# Patient Record
Sex: Female | Born: 1980 | Race: Black or African American | Hispanic: No | Marital: Single | Smoking: Never smoker
Health system: Southern US, Community
[De-identification: ages and names within clinical notes are randomized; demographics above are authoritative.]

## PROBLEM LIST (undated history)

## (undated) DIAGNOSIS — K589 Irritable bowel syndrome without diarrhea: Secondary | ICD-10-CM

## (undated) DIAGNOSIS — G4733 Obstructive sleep apnea (adult) (pediatric): Secondary | ICD-10-CM

## (undated) DIAGNOSIS — D649 Anemia, unspecified: Secondary | ICD-10-CM

## (undated) DIAGNOSIS — I10 Essential (primary) hypertension: Secondary | ICD-10-CM

## (undated) DIAGNOSIS — E119 Type 2 diabetes mellitus without complications: Secondary | ICD-10-CM

## (undated) DIAGNOSIS — G4730 Sleep apnea, unspecified: Secondary | ICD-10-CM

## (undated) DIAGNOSIS — G5603 Carpal tunnel syndrome, bilateral upper limbs: Secondary | ICD-10-CM

## (undated) DIAGNOSIS — A6000 Herpesviral infection of urogenital system, unspecified: Secondary | ICD-10-CM

## (undated) DIAGNOSIS — D219 Benign neoplasm of connective and other soft tissue, unspecified: Secondary | ICD-10-CM

## (undated) DIAGNOSIS — G43909 Migraine, unspecified, not intractable, without status migrainosus: Secondary | ICD-10-CM

## (undated) DIAGNOSIS — R011 Cardiac murmur, unspecified: Secondary | ICD-10-CM

## (undated) DIAGNOSIS — Z8669 Personal history of other diseases of the nervous system and sense organs: Secondary | ICD-10-CM

## (undated) HISTORY — DX: Sleep apnea, unspecified: G47.30

## (undated) HISTORY — DX: Benign neoplasm of connective and other soft tissue, unspecified: D21.9

## (undated) HISTORY — DX: Herpesviral infection of urogenital system, unspecified: A60.00

## (undated) HISTORY — PX: WISDOM TOOTH EXTRACTION: SHX21

## (undated) HISTORY — DX: Essential (primary) hypertension: I10

## (undated) HISTORY — DX: Personal history of other diseases of the nervous system and sense organs: Z86.69

## (undated) HISTORY — DX: Cardiac murmur, unspecified: R01.1

## (undated) HISTORY — DX: Migraine, unspecified, not intractable, without status migrainosus: G43.909

## (undated) HISTORY — DX: Type 2 diabetes mellitus without complications: E11.9

## (undated) HISTORY — DX: Morbid (severe) obesity due to excess calories: E66.01

## (undated) HISTORY — DX: Obstructive sleep apnea (adult) (pediatric): G47.33

## (undated) HISTORY — PX: CARPAL TUNNEL RELEASE: SHX101

## (undated) HISTORY — DX: Carpal tunnel syndrome, bilateral upper limbs: G56.03

---

## 2007-07-22 ENCOUNTER — Ambulatory Visit (HOSPITAL_COMMUNITY): Admission: RE | Admit: 2007-07-22 | Discharge: 2007-07-22 | Payer: Self-pay | Admitting: Obstetrics & Gynecology

## 2007-07-22 IMAGING — US US OB COMP LESS 14 WK
1 series · 14 of 28 positions shown · non-contrast
Comparison: none

OBSTETRICAL ULTRASOUND:
 This ultrasound was performed in The [HOSPITAL], and the [facility] OB/GYN report will be stored to [REDACTED] PACS.

[Series 1: us ob comp less 14 wk · 14 of 42 slices shown]
[im 2/42]
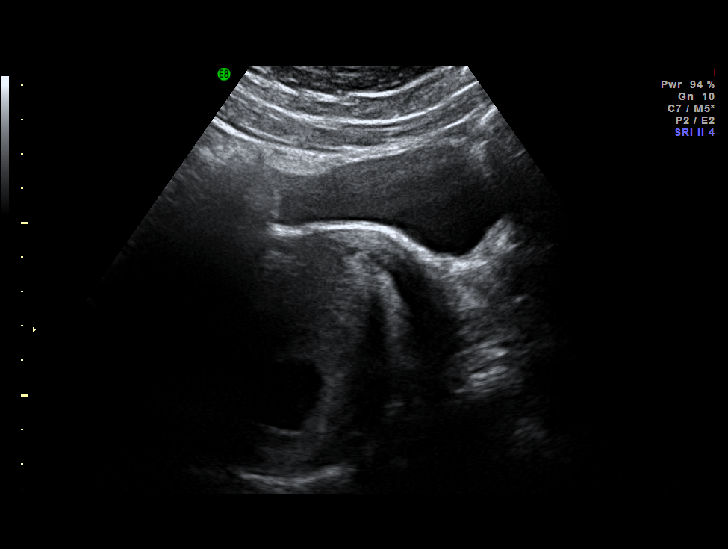
[im 5/42]
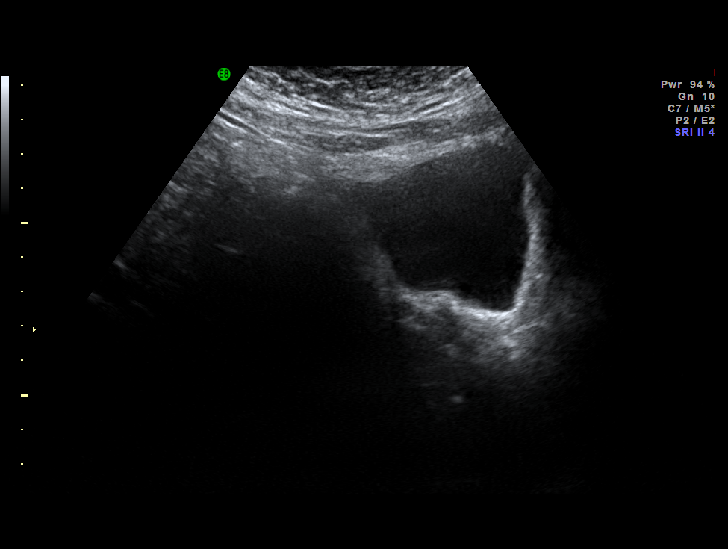
[im 8/42]
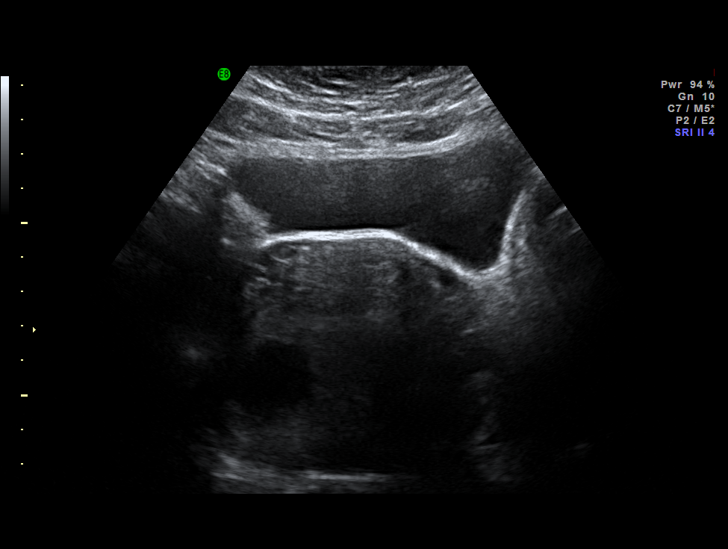
[im 11/42]
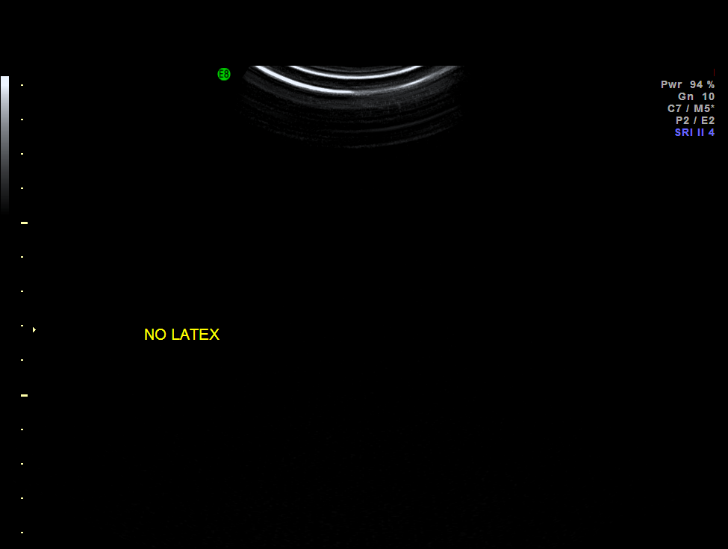
[im 14/42]
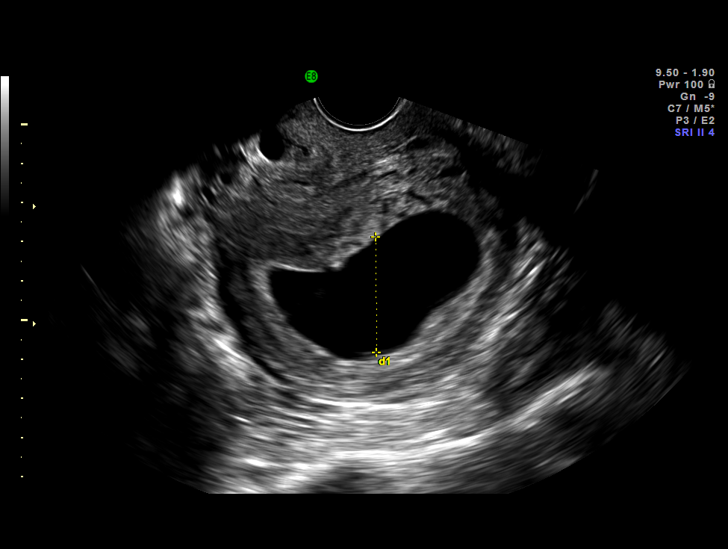
[im 17/42]
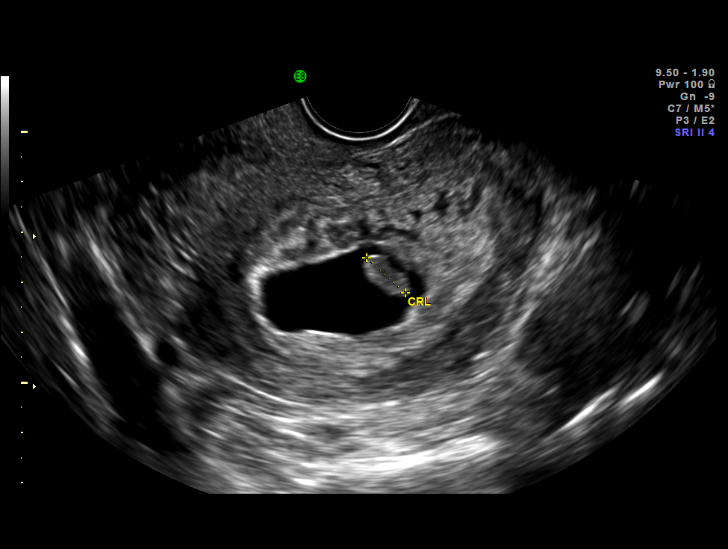
[im 20/42]
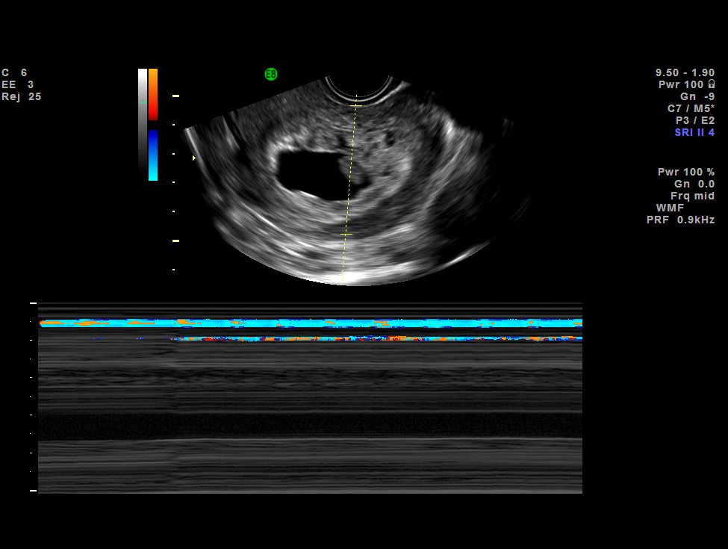
[im 23/42]
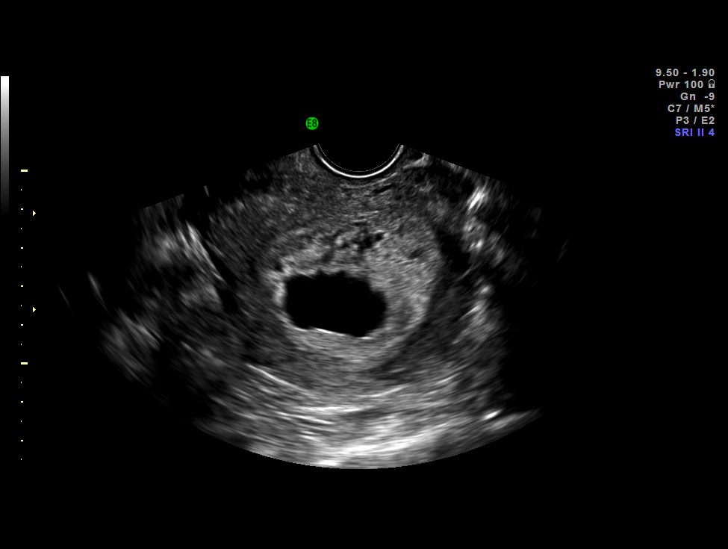
[im 26/42]
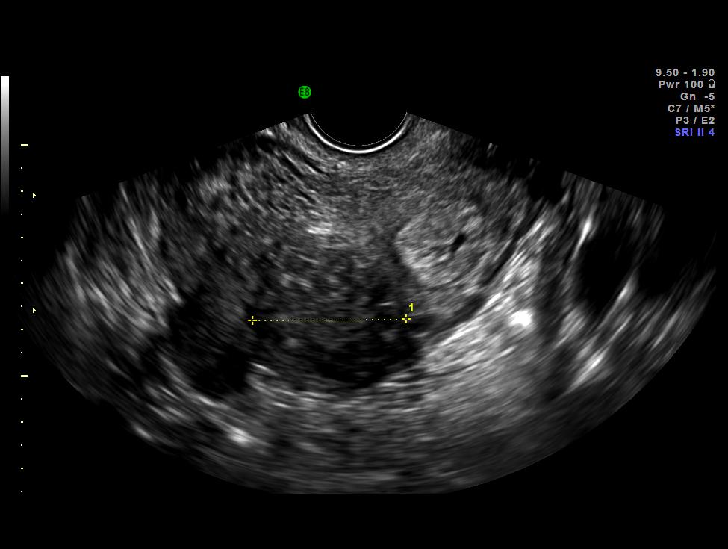
[im 29/42]
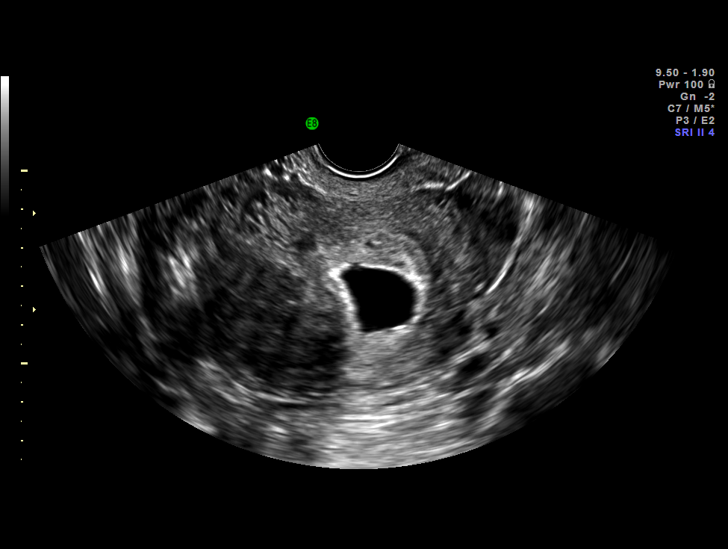
[im 32/42]
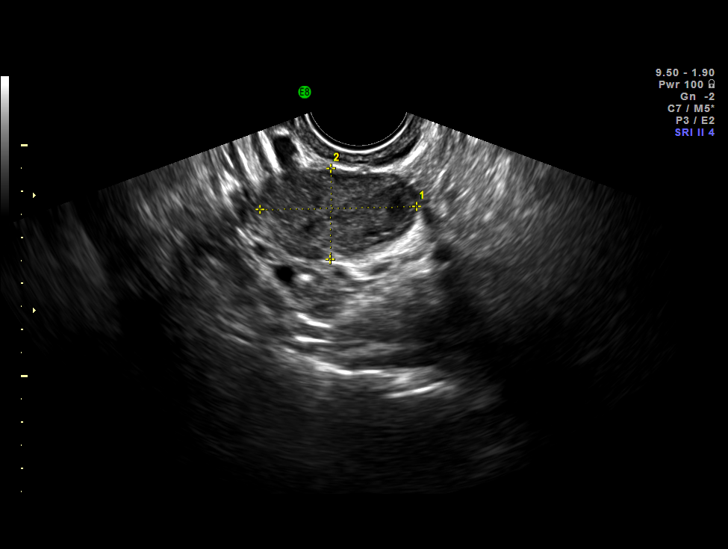
[im 35/42]
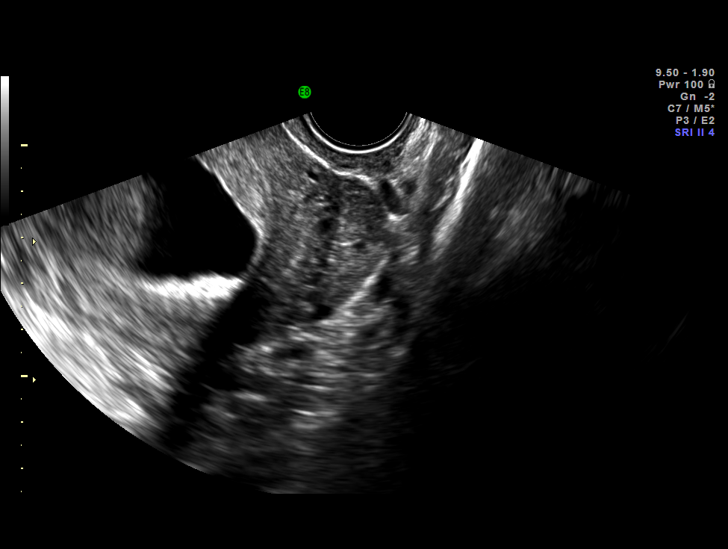
[im 38/42]
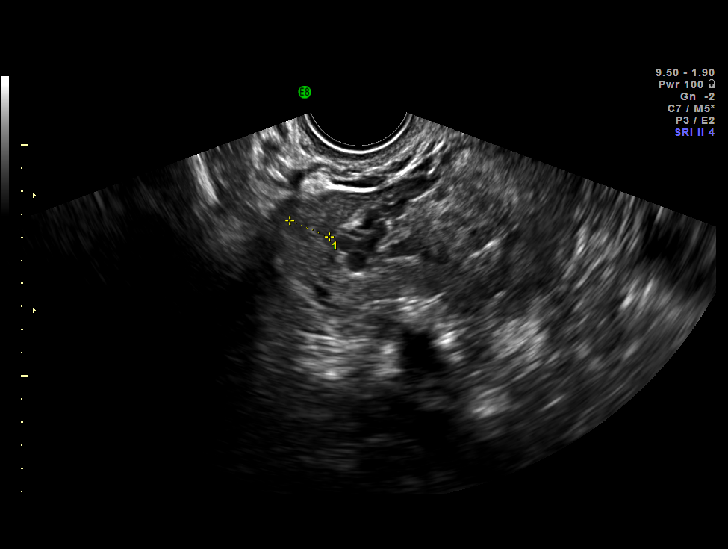
[im 42/42]
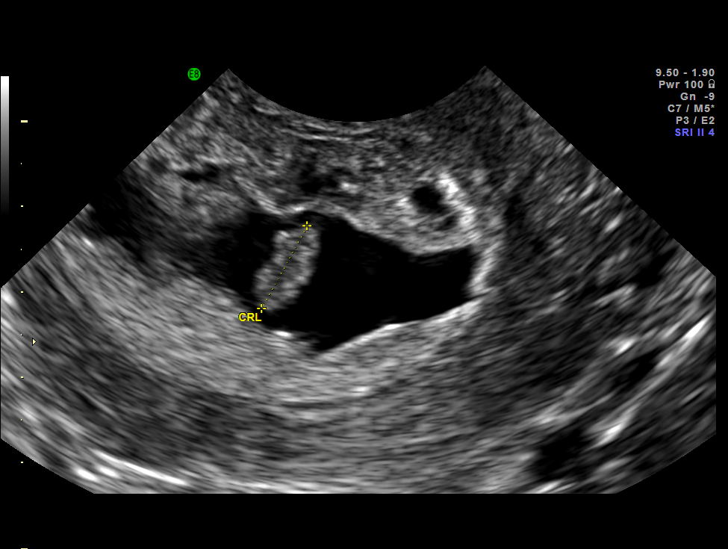

[14 of 28 positions shown; findings below may reference images not displayed]

IMPRESSION: The [facility] OB/GYN report has also been faxed to the ordering physician.

## 2007-07-25 ENCOUNTER — Ambulatory Visit (HOSPITAL_COMMUNITY): Admission: RE | Admit: 2007-07-25 | Discharge: 2007-07-25 | Payer: Self-pay | Admitting: Obstetrics & Gynecology

## 2007-07-25 IMAGING — US US OB TRANSVAGINAL
1 series · 14 of 20 positions shown · non-contrast
Comparison: none

OBSTETRICAL ULTRASOUND:

 This ultrasound exam was performed in the [HOSPITAL] Ultrasound Department.  The OB US report was generated in the AS system, and faxed to the ordering physician.  This report is also available in [REDACTED] PACS.

[Series 1: us ob transvaginal · 0.13mm/px · 14 of 20 slices shown]
[im 1/20]
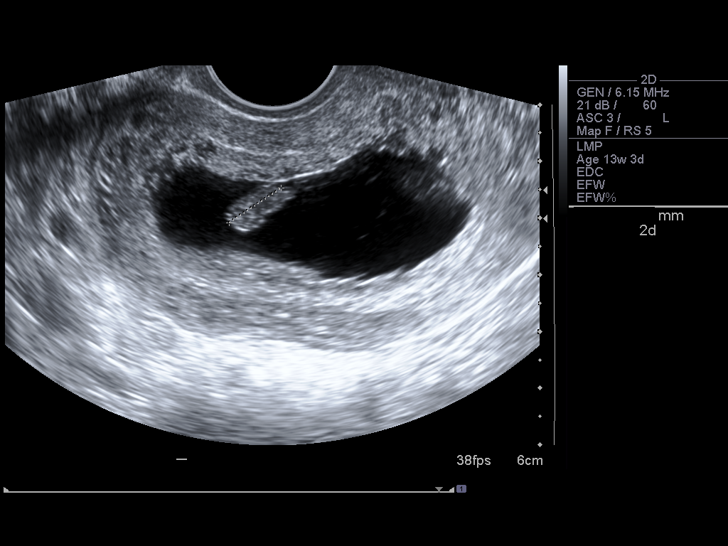
[im 3/20]
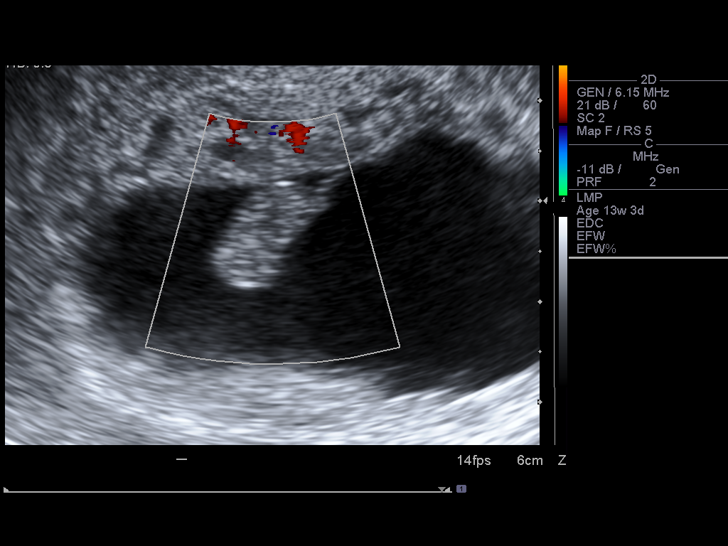
[im 4/20]
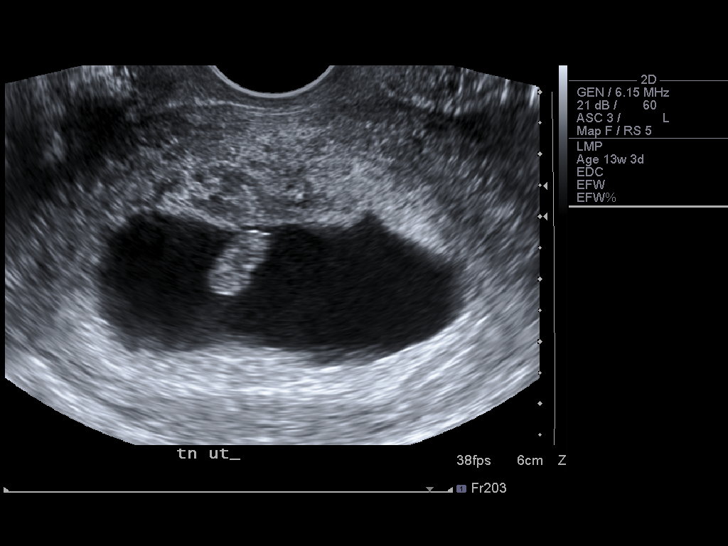
[im 6/20]
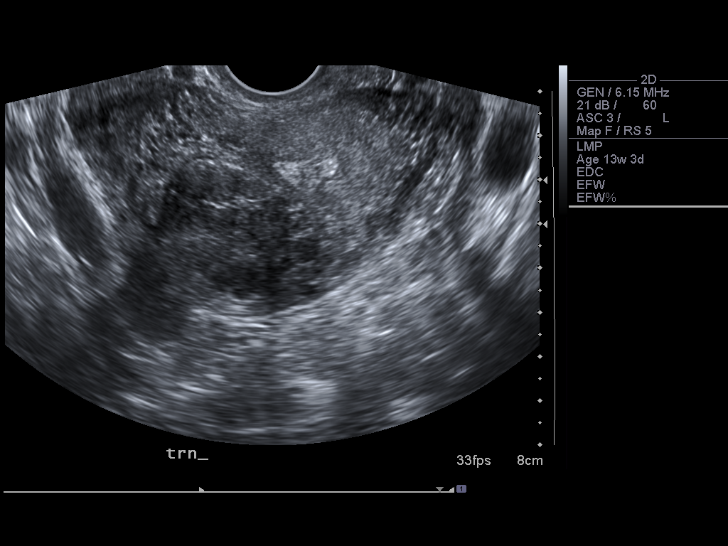
[im 7/20]
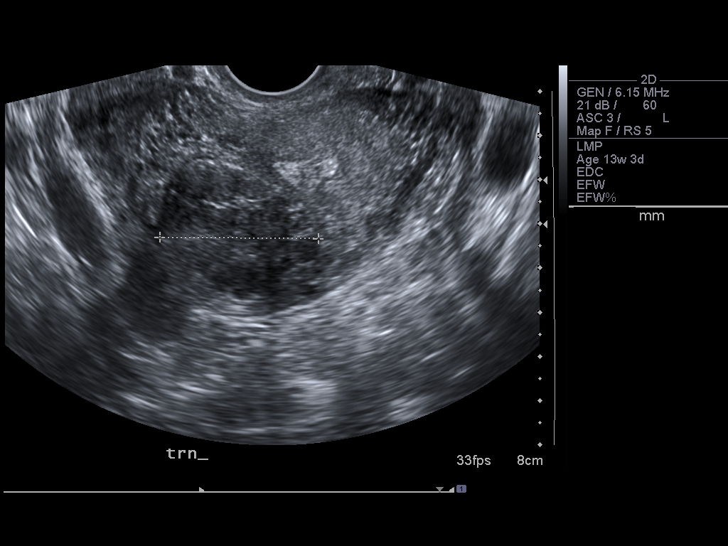
[im 8/20]
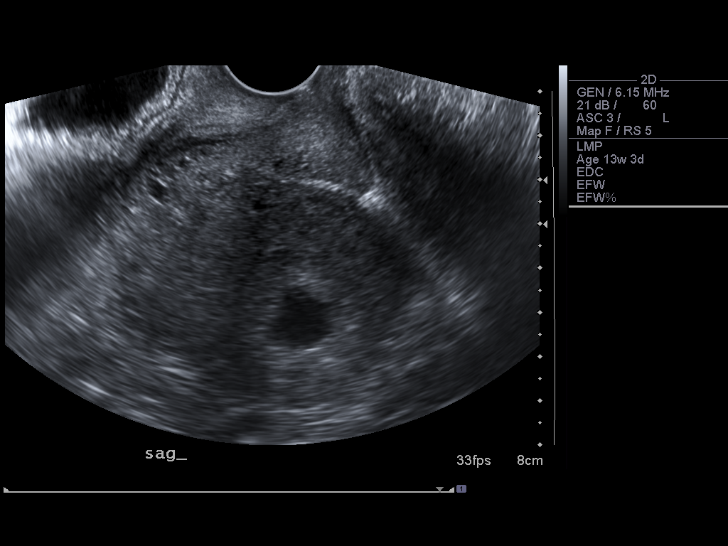
[im 10/20]
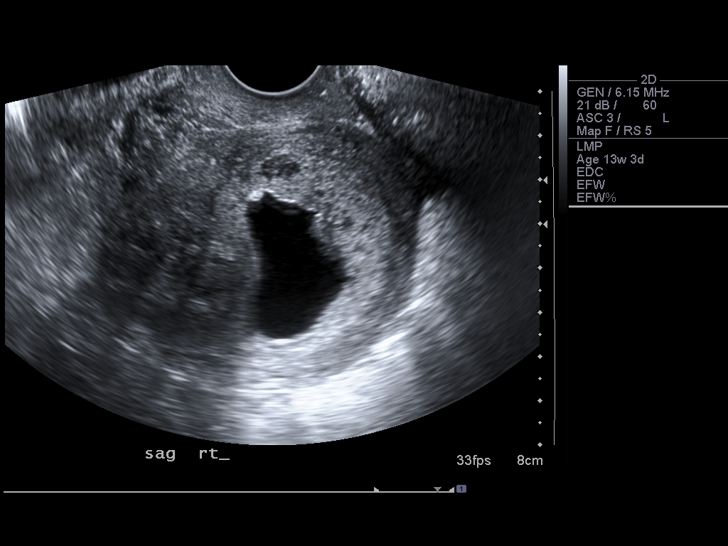
[im 11/20]
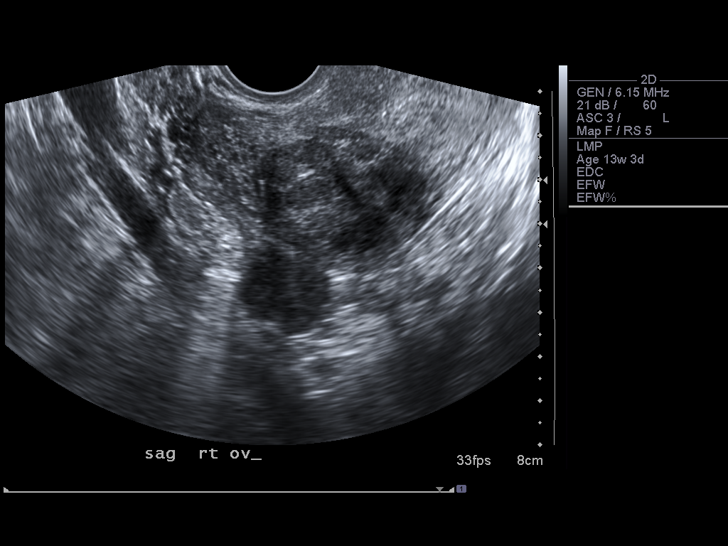
[im 13/20]
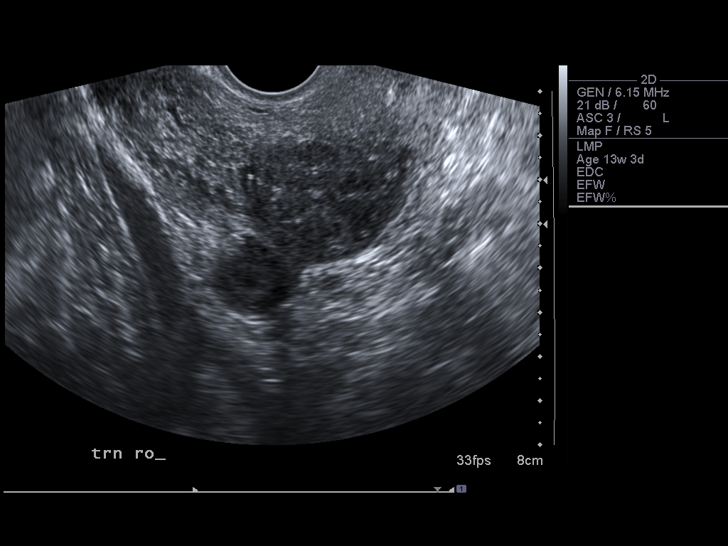
[im 14/20]
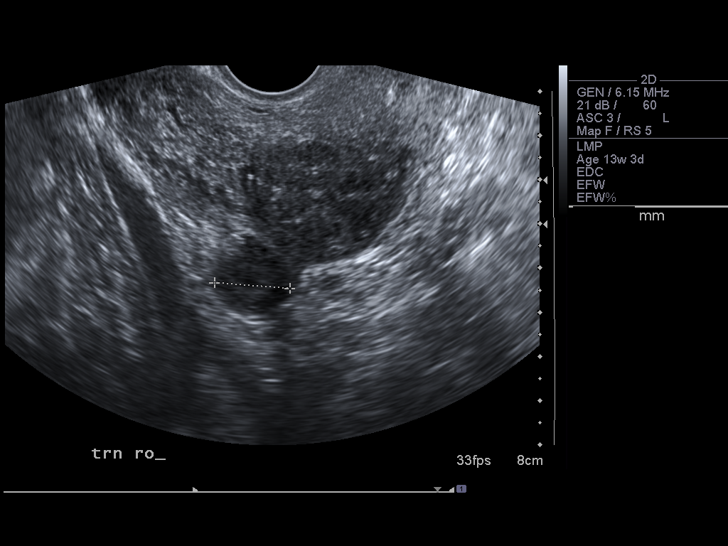
[im 16/20]
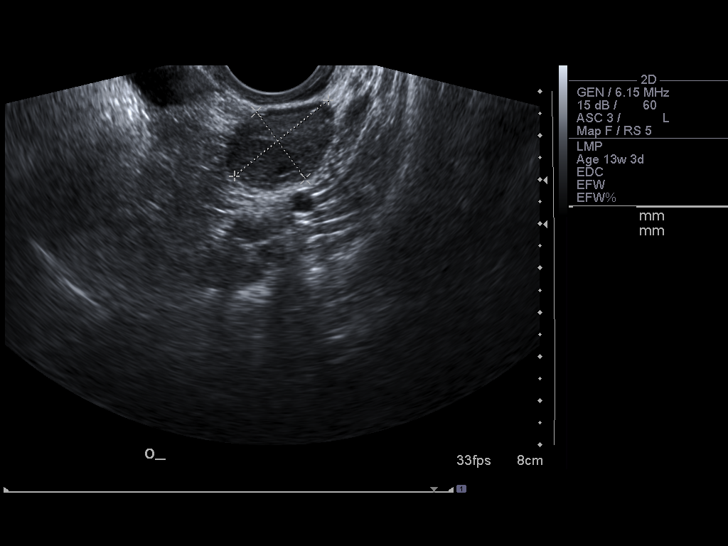
[im 17/20]
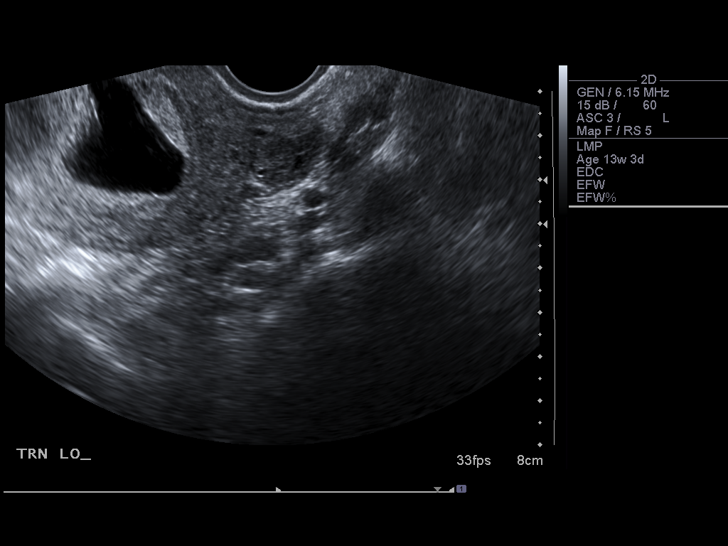
[im 18/20]
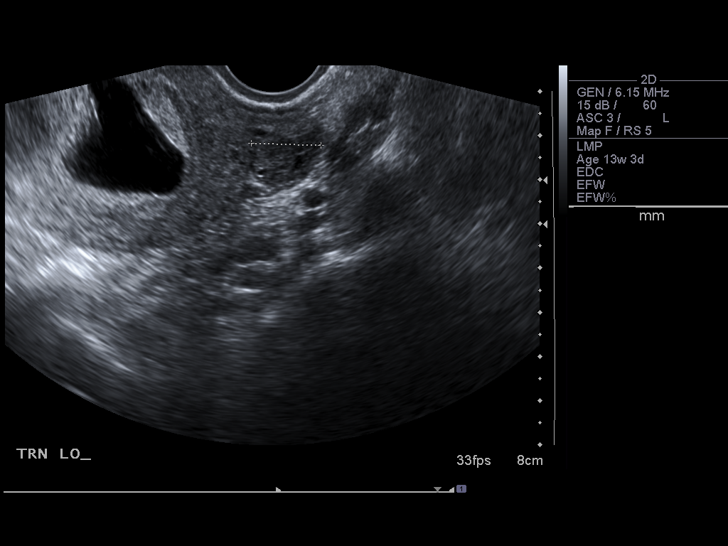
[im 20/20]
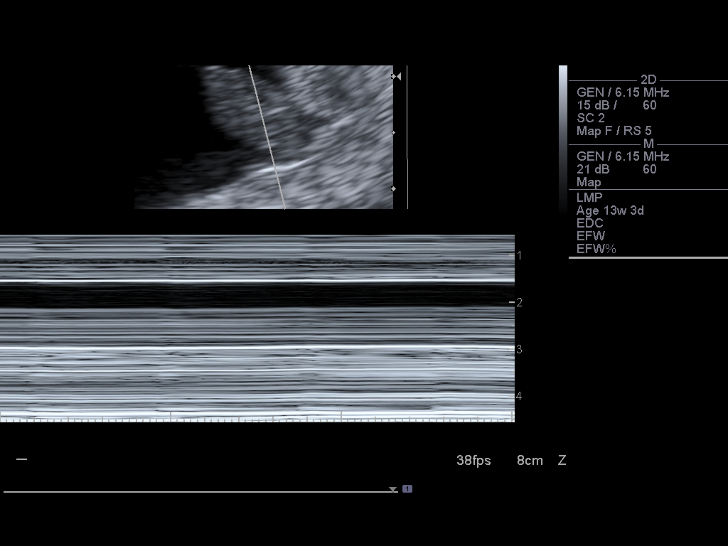

[14 of 20 positions shown; findings below may reference images not displayed]

IMPRESSION: See AS Obstetric US report.

## 2007-07-28 ENCOUNTER — Encounter: Payer: Self-pay | Admitting: Obstetrics & Gynecology

## 2007-07-28 ENCOUNTER — Observation Stay (HOSPITAL_COMMUNITY): Admission: AD | Admit: 2007-07-28 | Discharge: 2007-07-29 | Payer: Self-pay | Admitting: Obstetrics & Gynecology

## 2008-03-26 ENCOUNTER — Ambulatory Visit (HOSPITAL_COMMUNITY): Admission: RE | Admit: 2008-03-26 | Discharge: 2008-03-26 | Payer: Self-pay | Admitting: Obstetrics & Gynecology

## 2008-03-26 IMAGING — US US OB TRANSVAGINAL MODIFY
1 series · 14 of 28 positions shown · non-contrast
Comparison: none

OBSTETRICAL ULTRASOUND:
 This ultrasound exam was performed in the [HOSPITAL] Ultrasound Department.  The OB US report was generated in the AS system, and faxed to the ordering physician.  This report is also available in [REDACTED] PACS.

[Series 1: us ob transvaginal modify · 0.26mm/px · 14 of 36 slices shown]
[im 2/36]
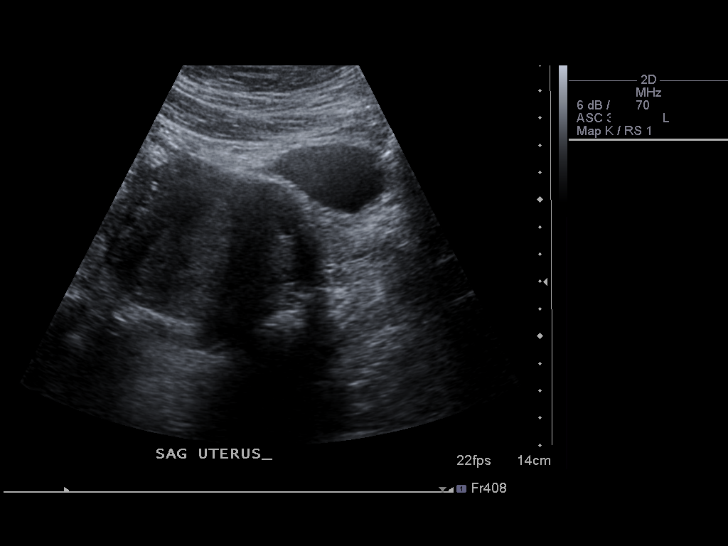
[im 4/36]
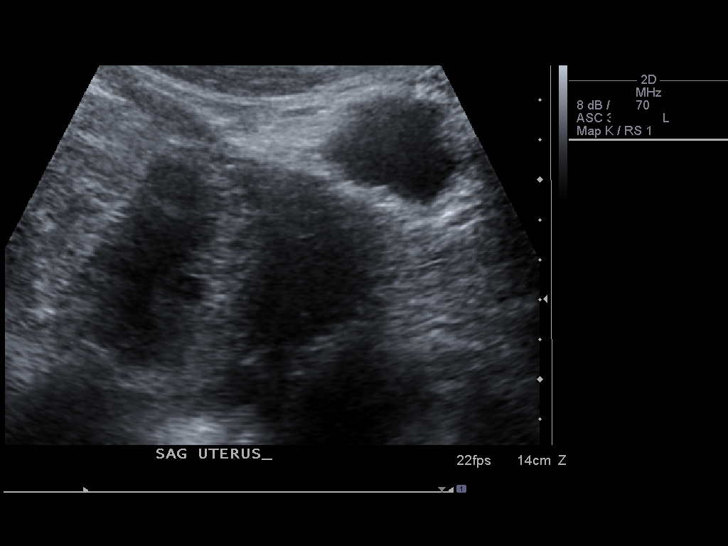
[im 7/36]
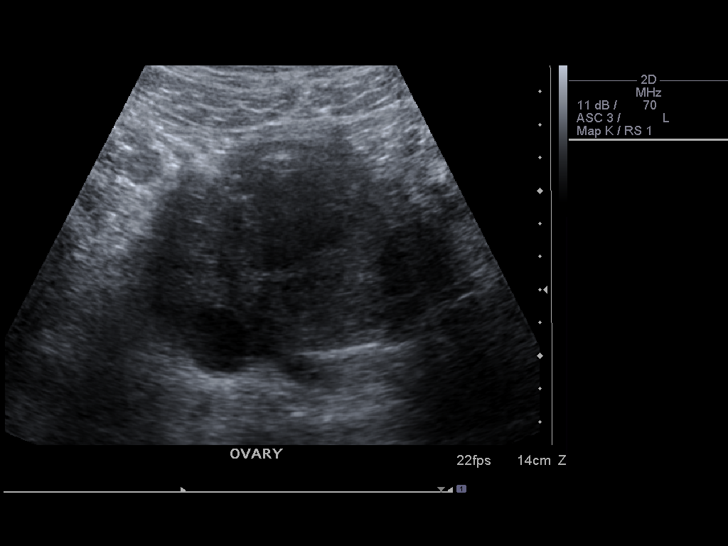
[im 10/36]
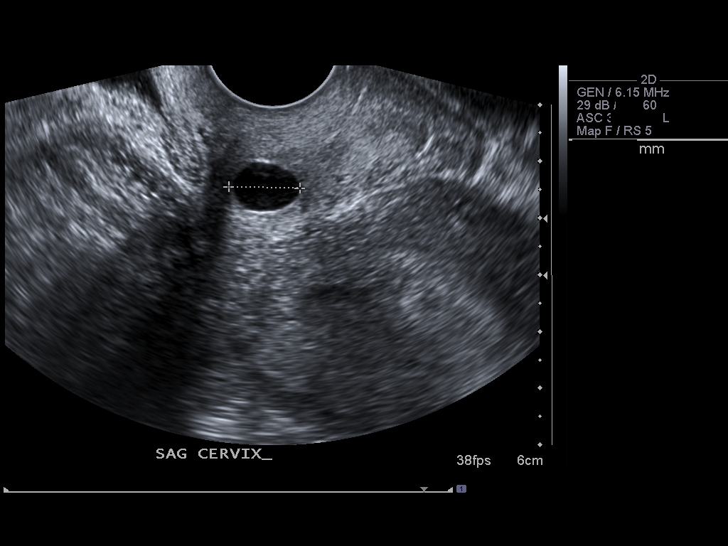
[im 12/36]
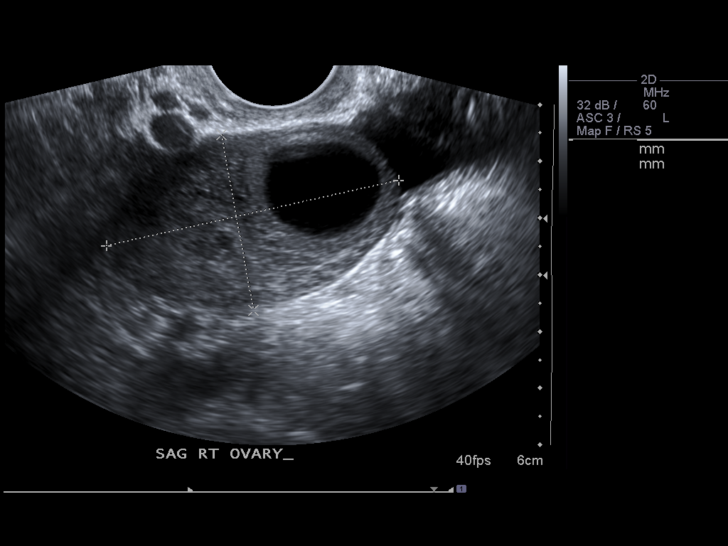
[im 15/36]
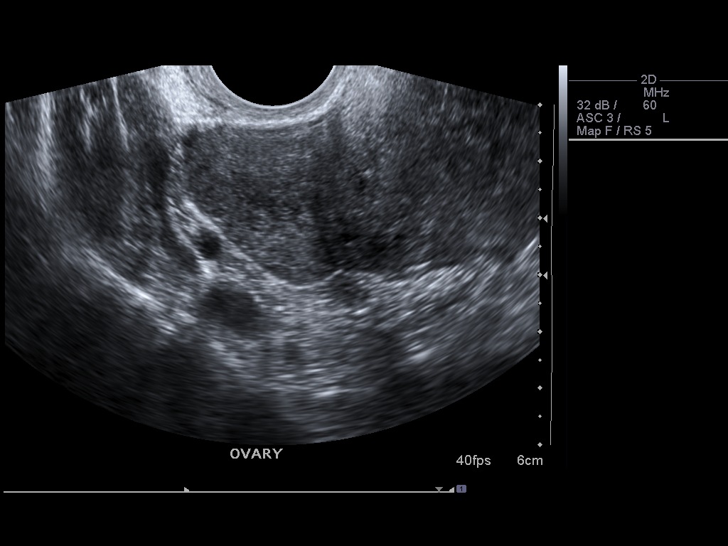
[im 17/36]
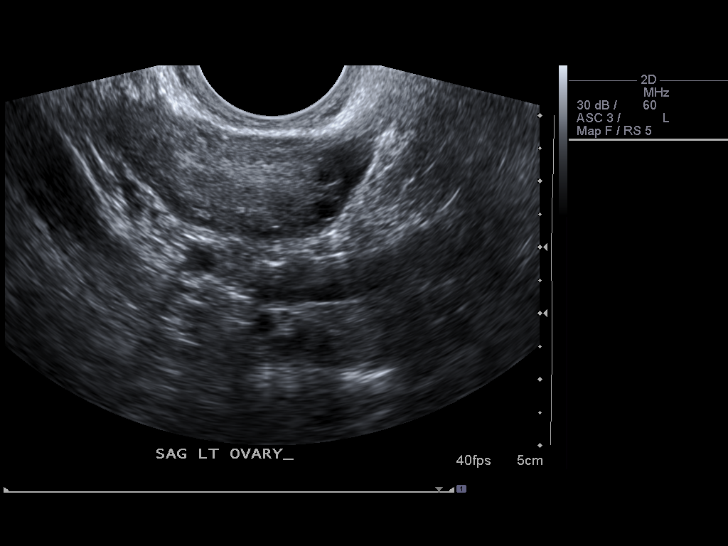
[im 20/36]
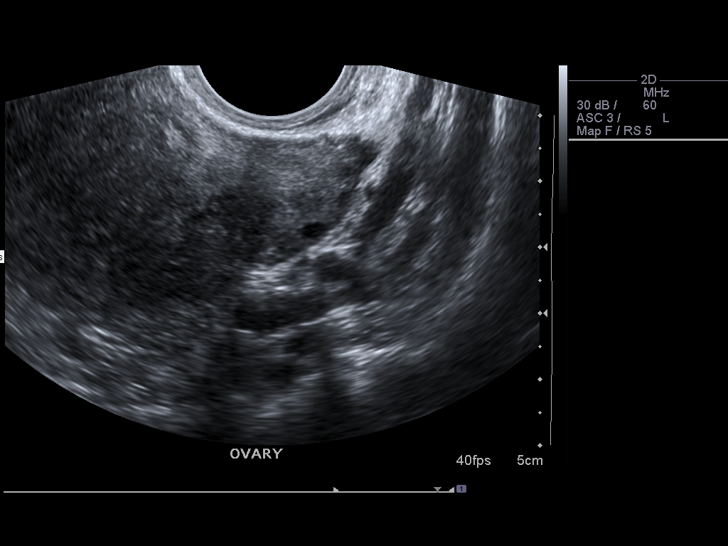
[im 23/36]
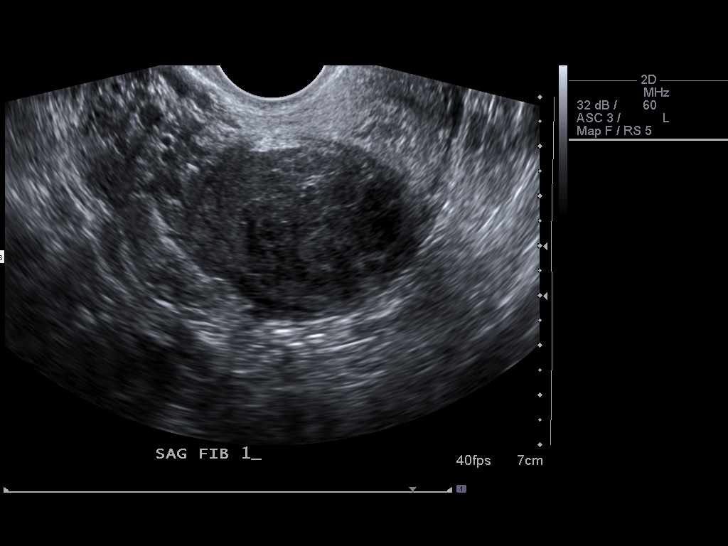
[im 25/36]
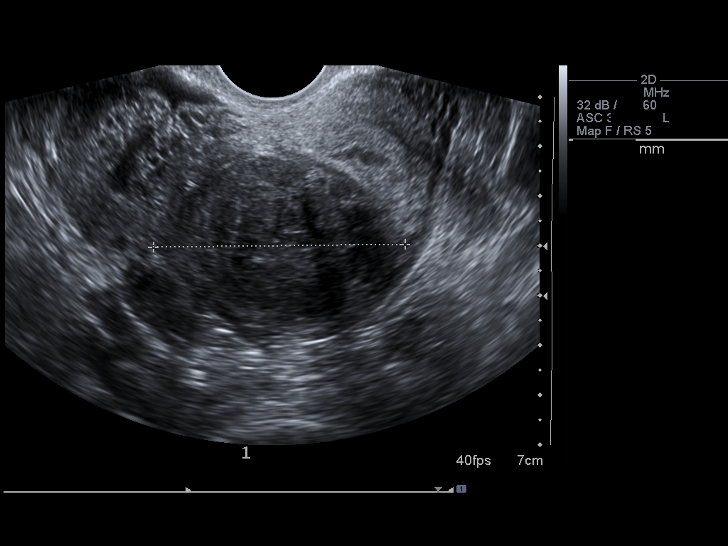
[im 28/36]
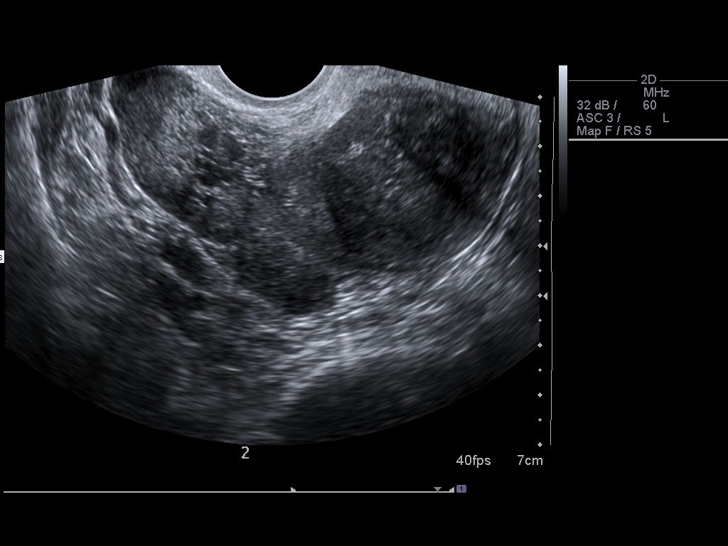
[im 30/36]
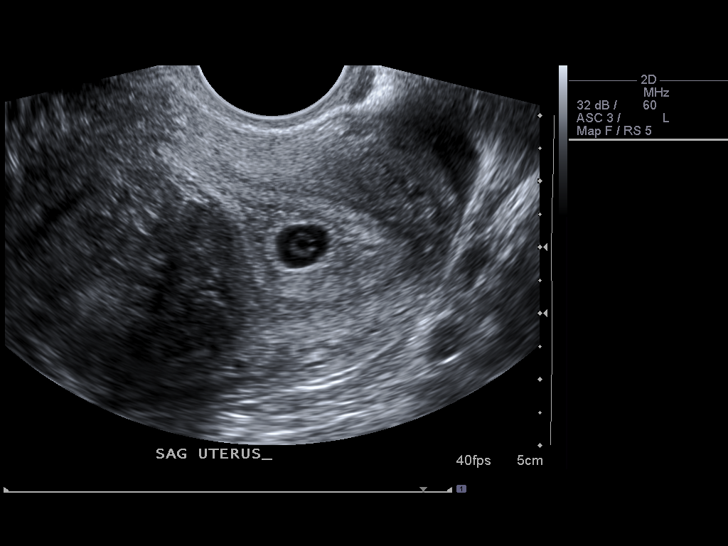
[im 33/36]
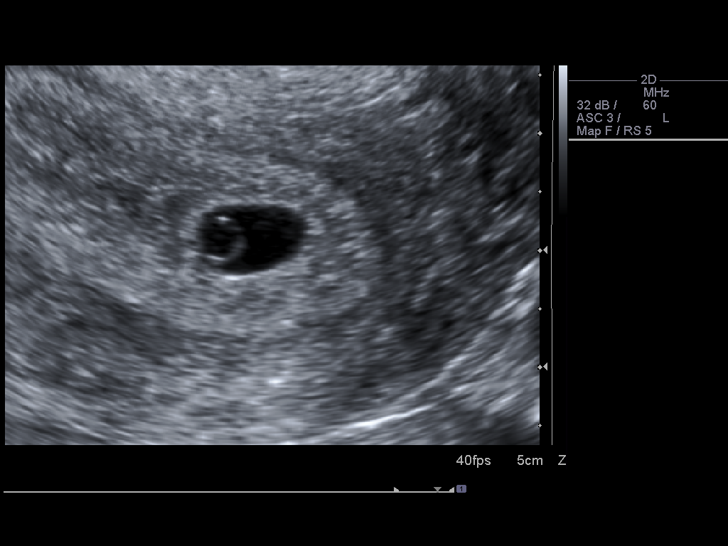
[im 36/36]
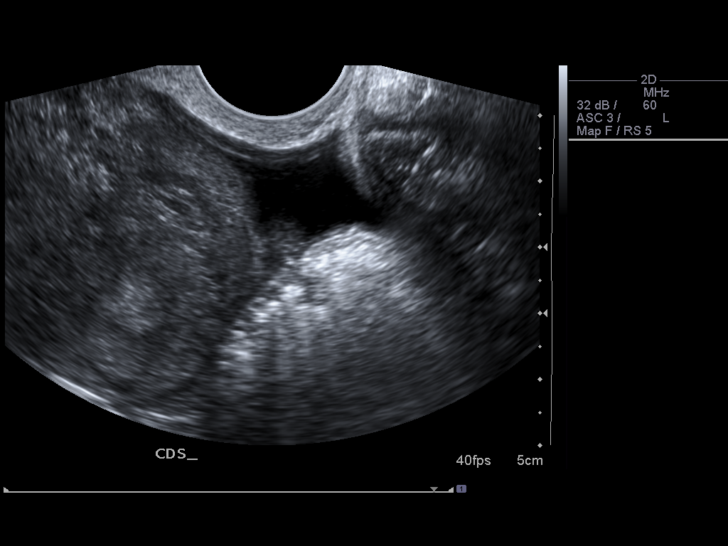

[14 of 28 positions shown; findings below may reference images not displayed]

IMPRESSION: See AS Obstetric US report.

## 2008-09-02 ENCOUNTER — Other Ambulatory Visit: Payer: Self-pay | Admitting: Emergency Medicine

## 2008-09-02 ENCOUNTER — Ambulatory Visit: Payer: Self-pay | Admitting: Obstetrics and Gynecology

## 2008-09-02 ENCOUNTER — Inpatient Hospital Stay (HOSPITAL_COMMUNITY): Admission: AD | Admit: 2008-09-02 | Discharge: 2008-09-02 | Payer: Self-pay | Admitting: Obstetrics

## 2008-10-11 ENCOUNTER — Ambulatory Visit (HOSPITAL_COMMUNITY): Admission: RE | Admit: 2008-10-11 | Discharge: 2008-10-11 | Payer: Self-pay | Admitting: Obstetrics & Gynecology

## 2008-10-11 IMAGING — US US FETAL BPP W/O NONSTRESS
1 series · 14 of 14 positions shown · non-contrast
Comparison: none

OBSTETRICAL ULTRASOUND:
 This ultrasound exam was performed in the [HOSPITAL] Ultrasound Department.  The OB US report was generated in the AS system, and faxed to the ordering physician.  This report is also available in [REDACTED] PACS.

[Series 1: us fetal bpp w/o nonstress · non-contrast · 0.33mm/px · 14 acquisitions, 14 frames shown]
[im 1/14]
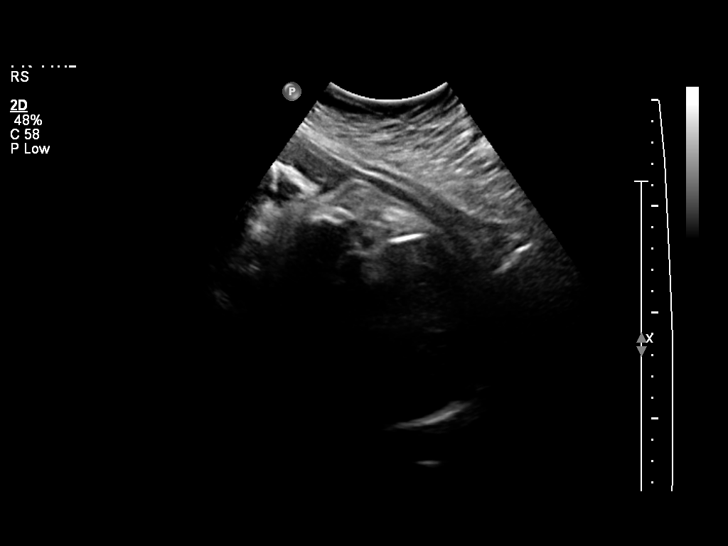
[im 2/14]
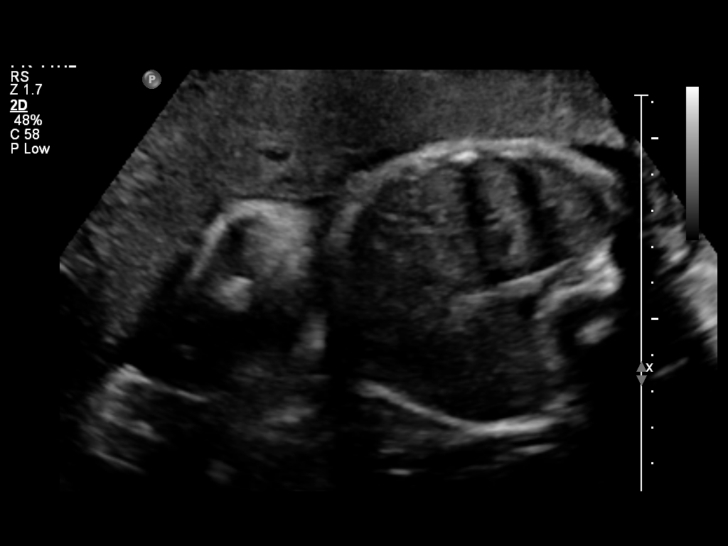
[im 3/14]
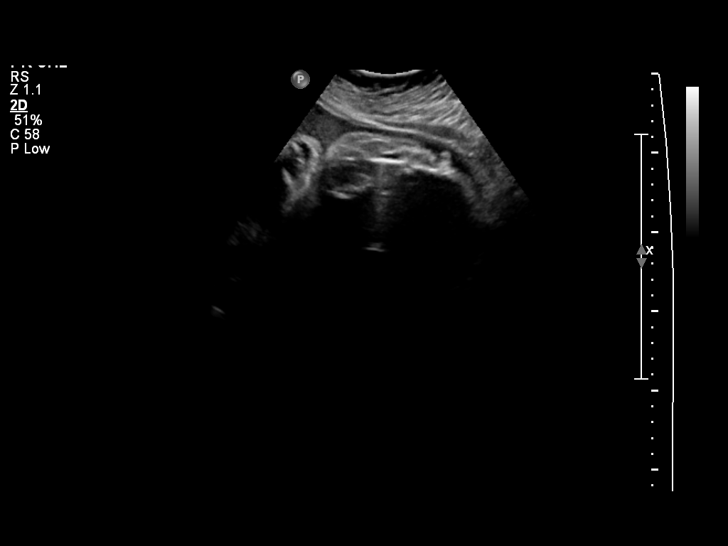
[im 4/14]
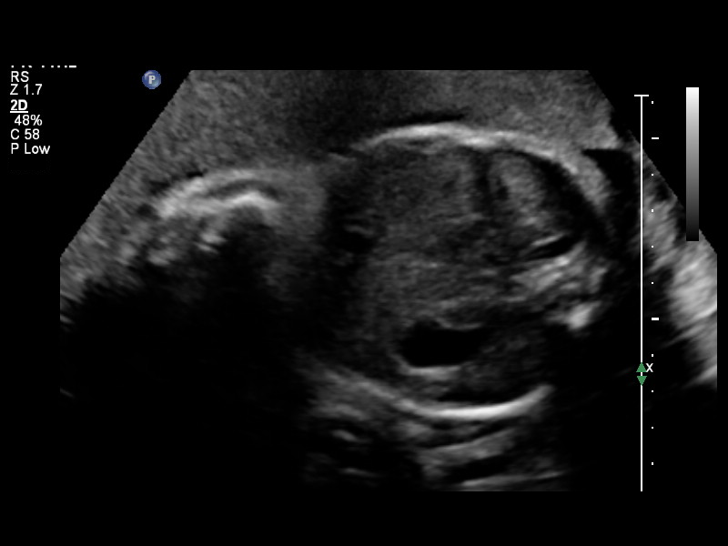
[im 5/14]
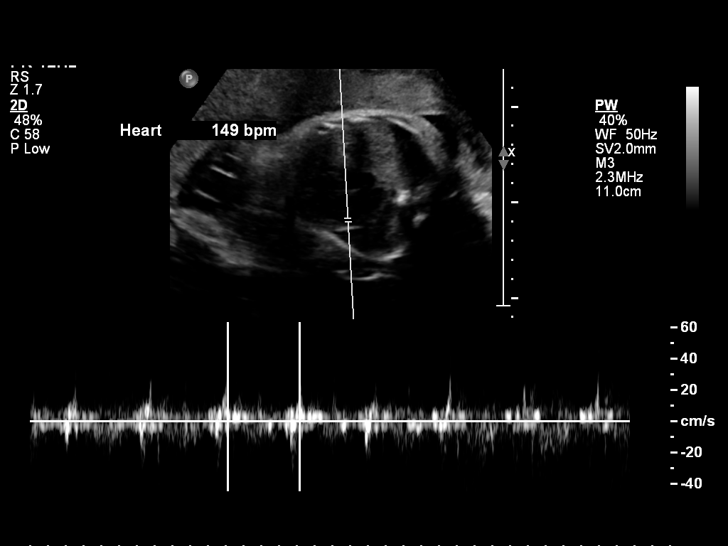
[im 6/14]
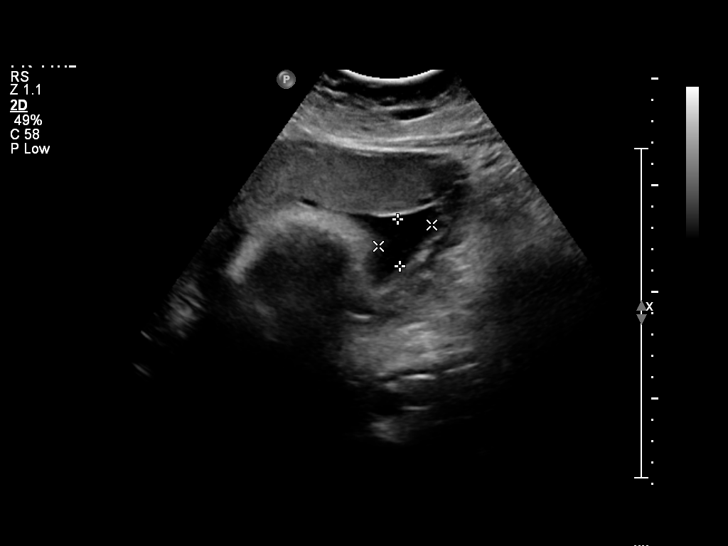
[im 7/14]
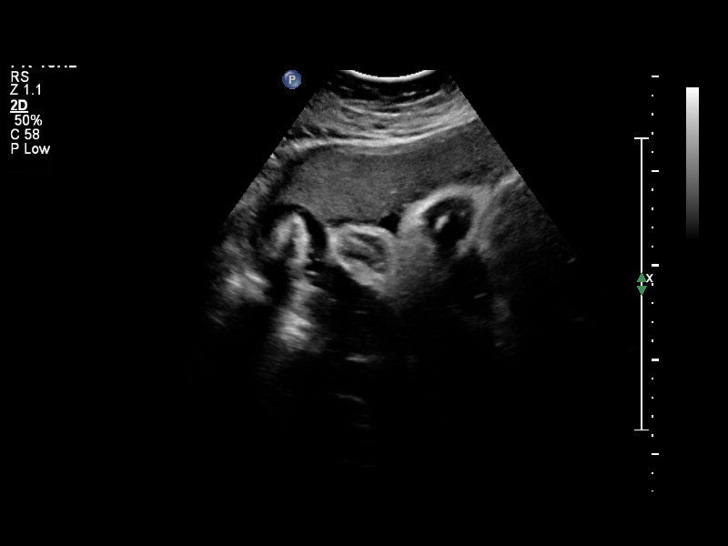
[im 8/14]
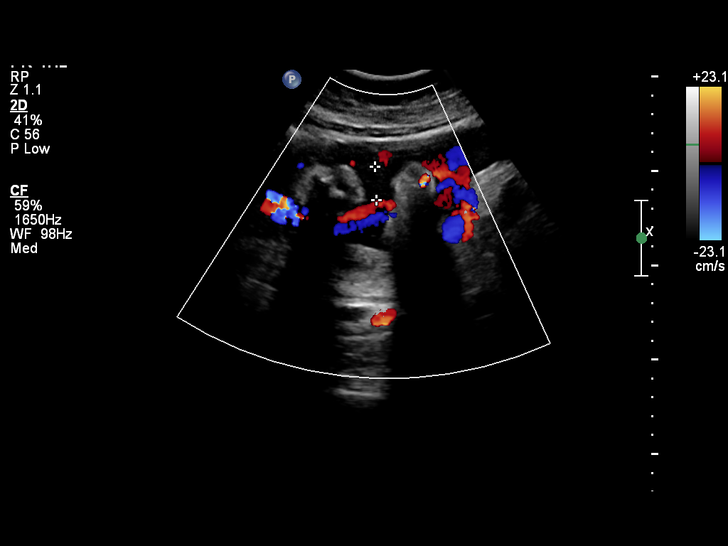
[im 9/14]
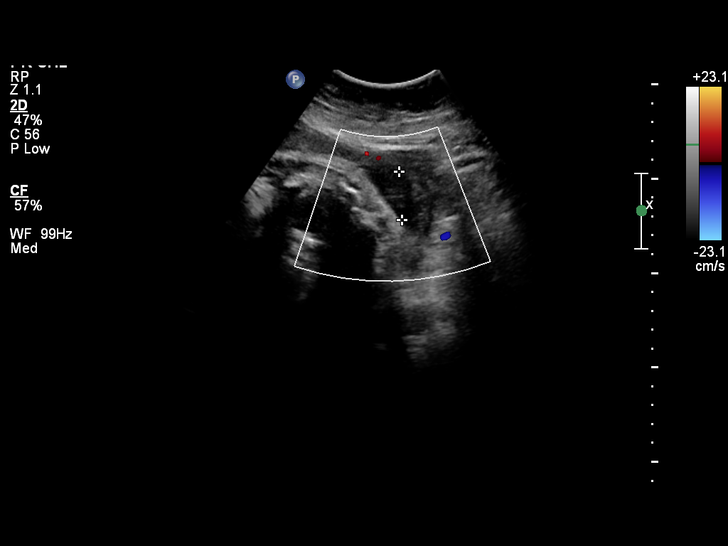
[im 10/14]
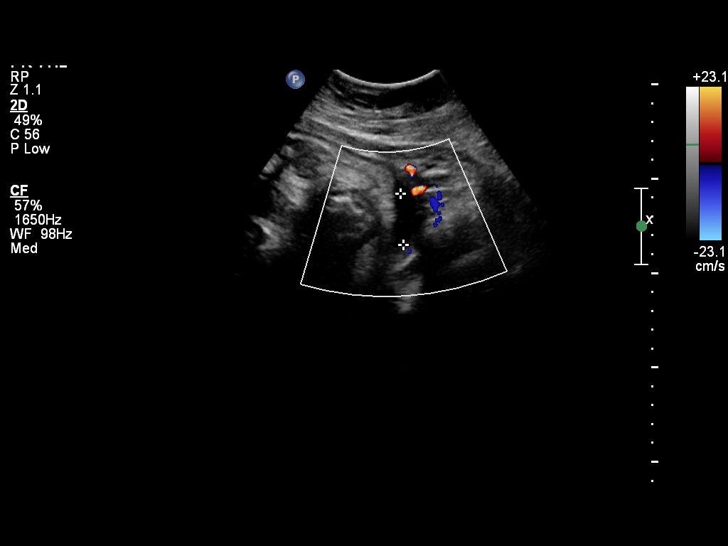
[im 11/14]
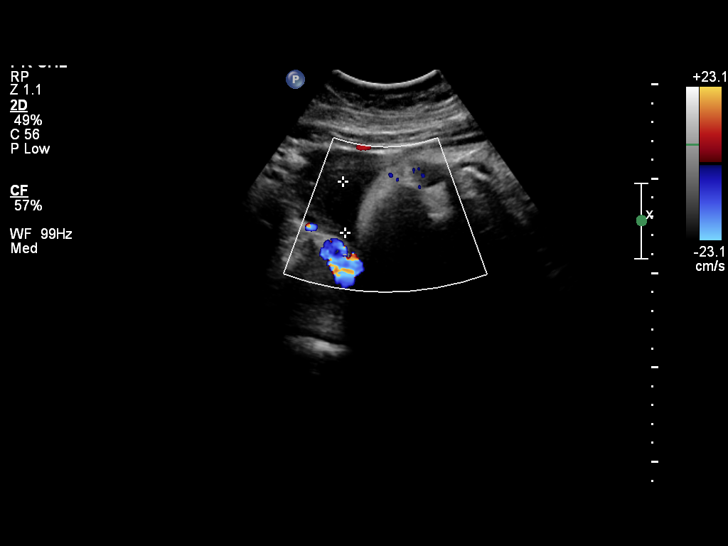
[im 12/14]
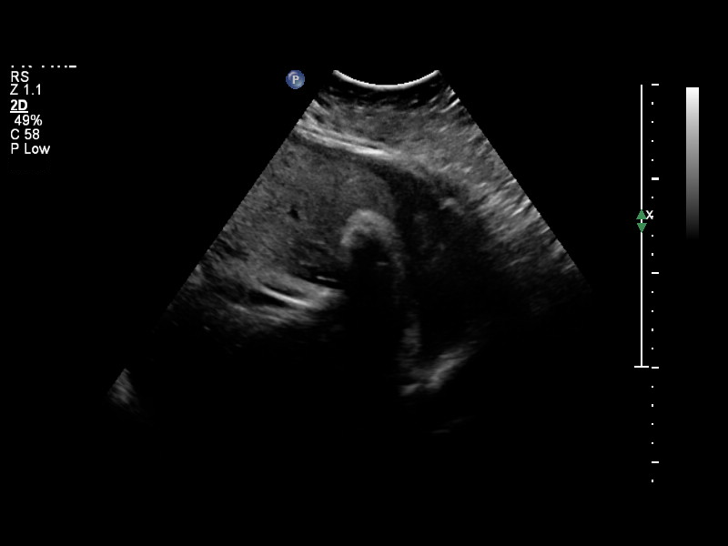
[im 13/14]
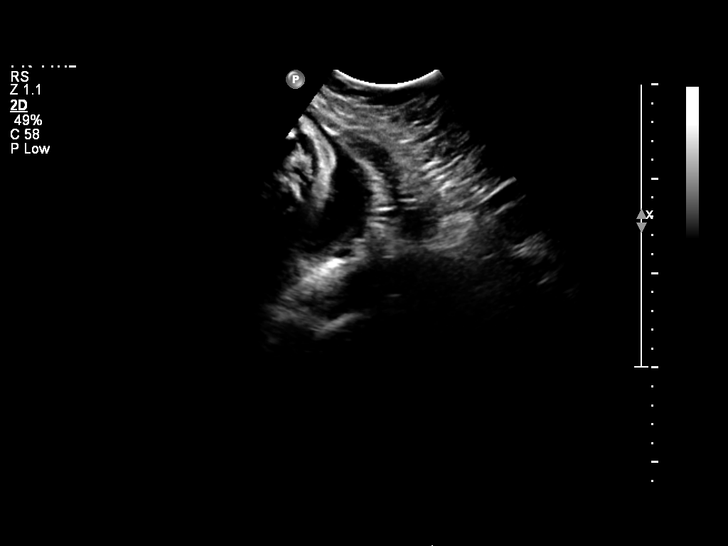
[im 14/14]
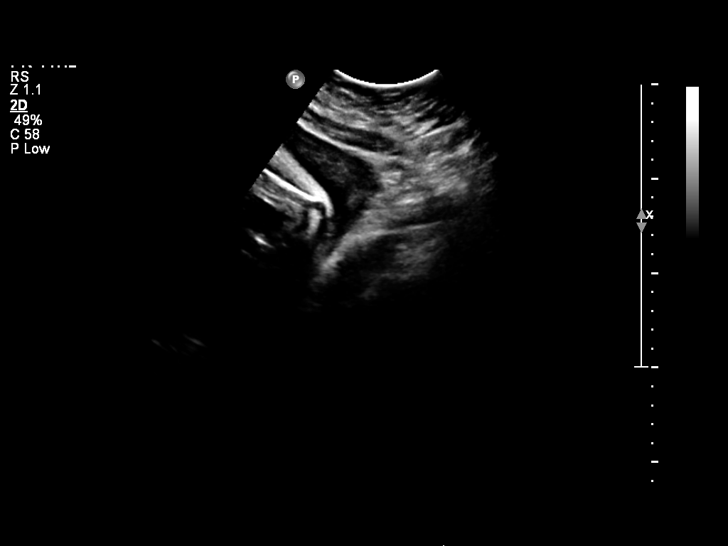

[14 of 14 positions shown; findings below may reference images not displayed]

IMPRESSION: See AS Obstetric US report.

## 2008-10-23 ENCOUNTER — Observation Stay (HOSPITAL_COMMUNITY): Admission: AD | Admit: 2008-10-23 | Discharge: 2008-10-24 | Payer: Self-pay | Admitting: Obstetrics & Gynecology

## 2008-10-23 IMAGING — US US FETAL BPP W/O NONSTRESS
1 series · 14 of 25 positions shown · non-contrast
Comparison: none

OBSTETRICAL ULTRASOUND:
 This ultrasound exam was performed in the [HOSPITAL] Ultrasound Department.  The OB US report was generated in the AS system, and faxed to the ordering physician.  This report is also available in [REDACTED] PACS.

[Series 1: us fetal bpp w/o nonstress · non-contrast · 0.30mm/px · 25 acquisitions, 14 frames shown]
[im 1/25]
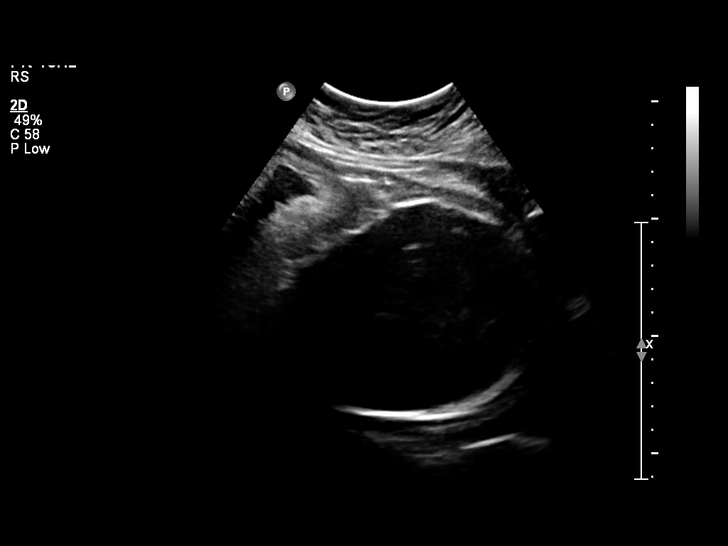
[im 3/25]
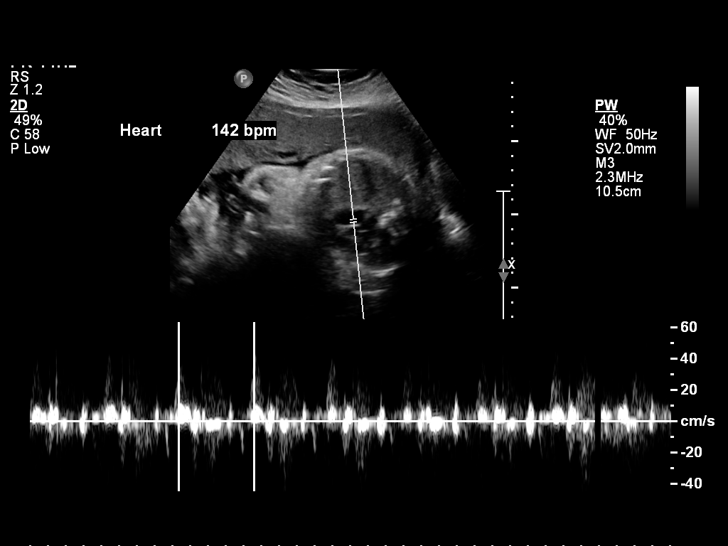
[im 5/25]
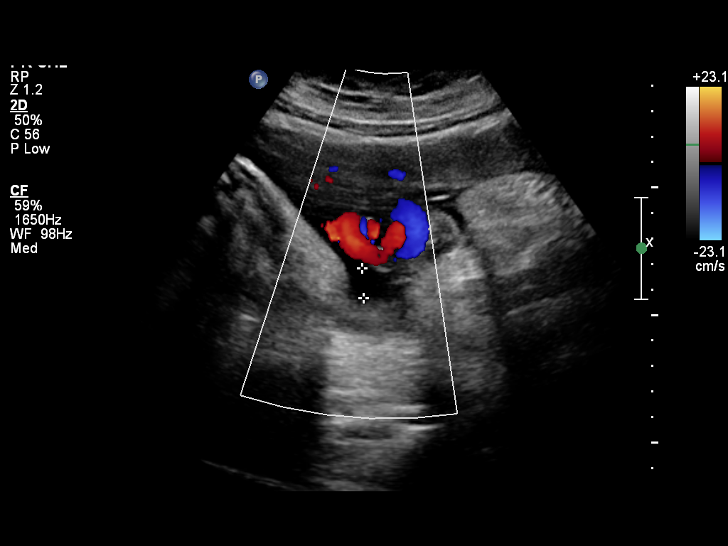
[im 7/25]
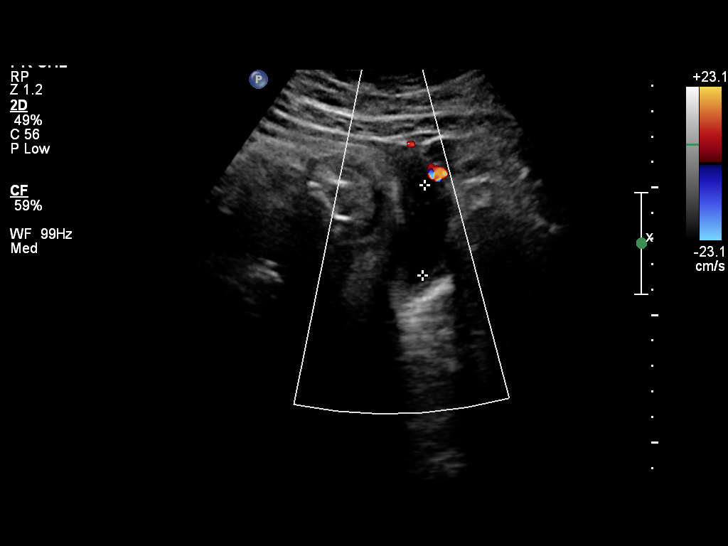
[im 9/25]
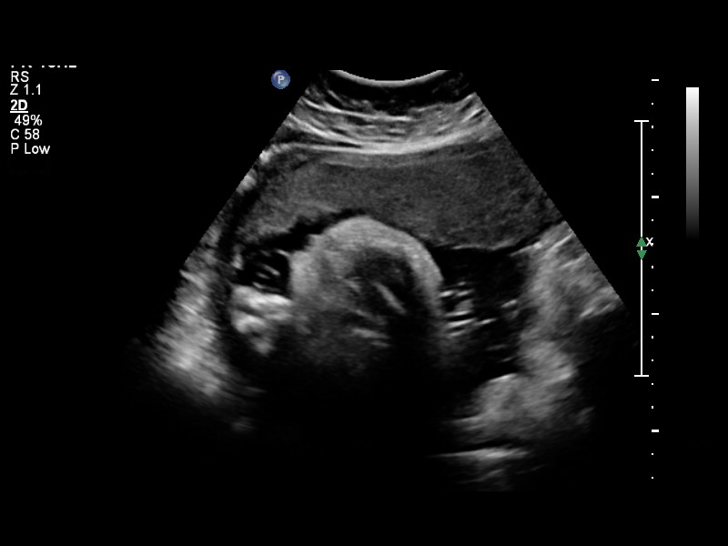
[im 10/25]
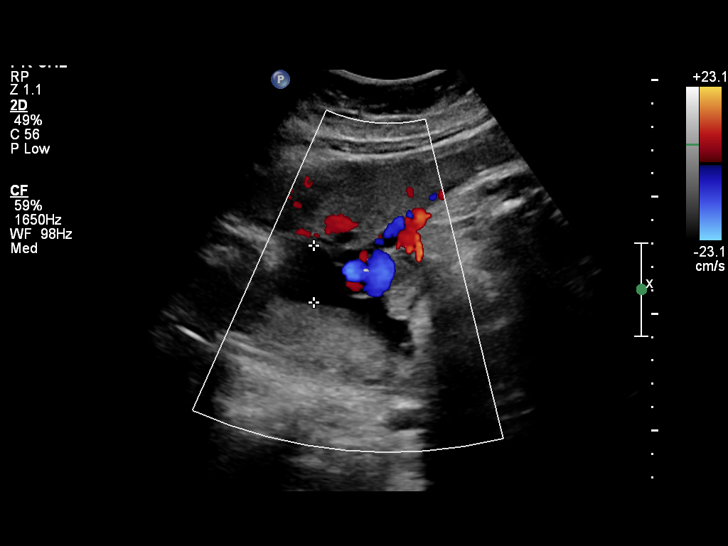
[im 12/25]
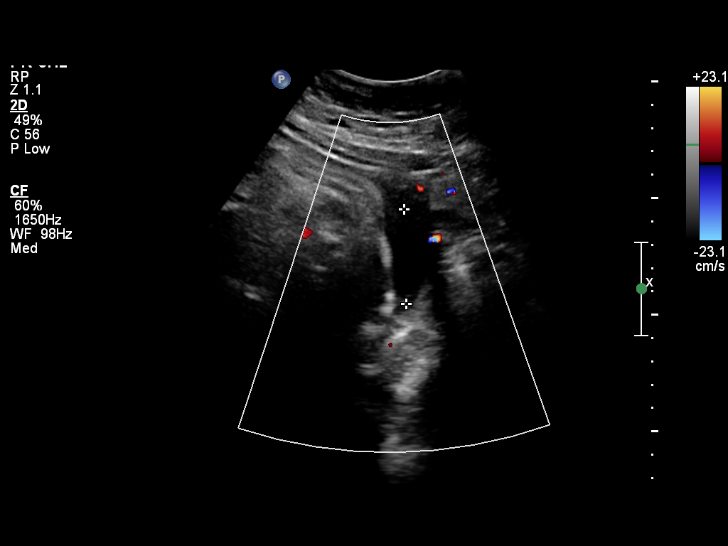
[im 14/25]
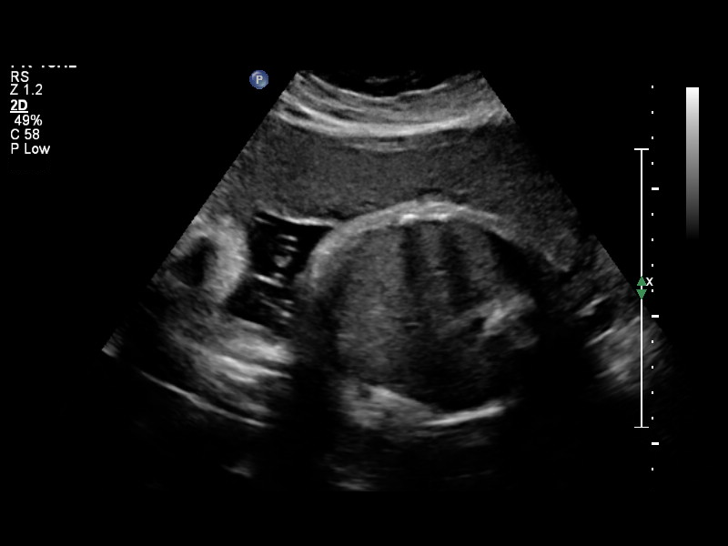
[im 16/25]
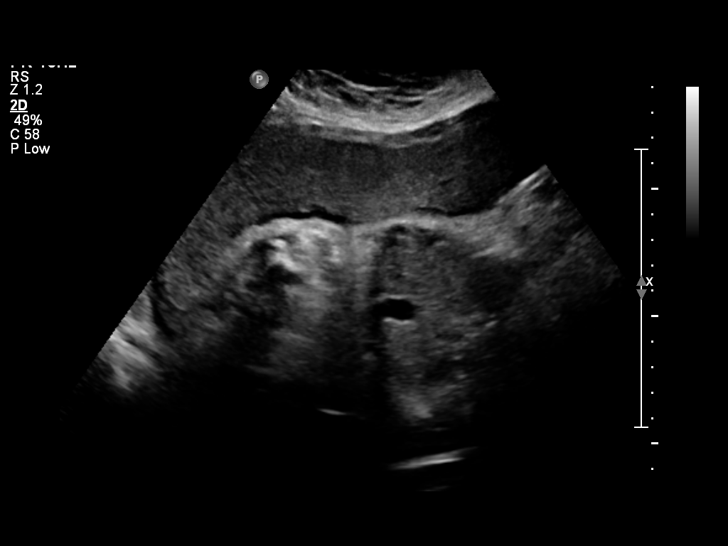
[im 17/25]
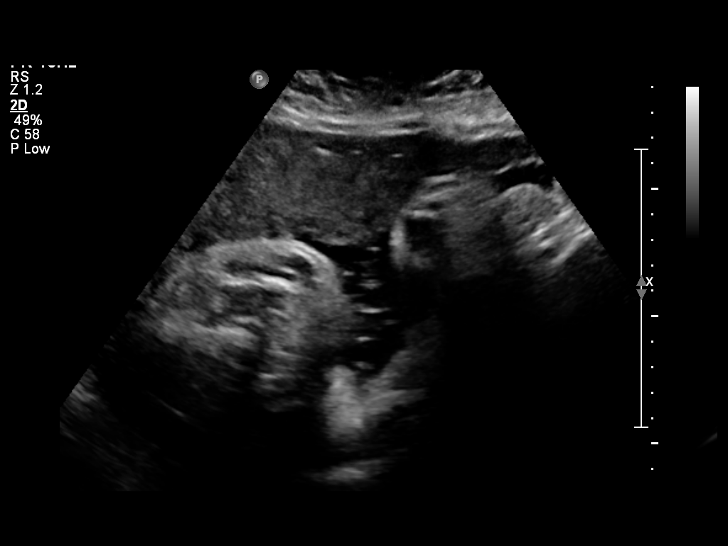
[im 19/25]
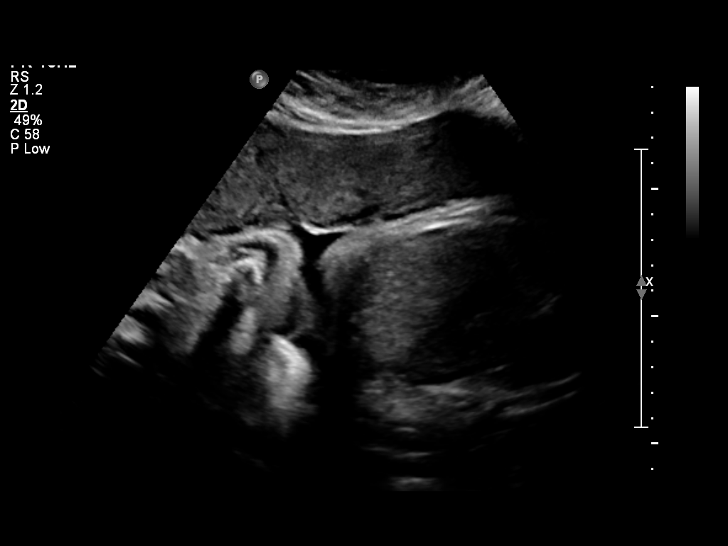
[im 21/25]
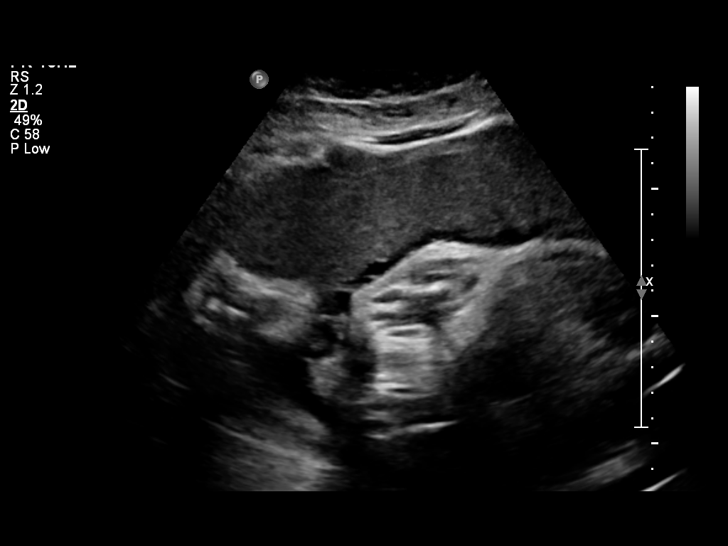
[im 23/25]
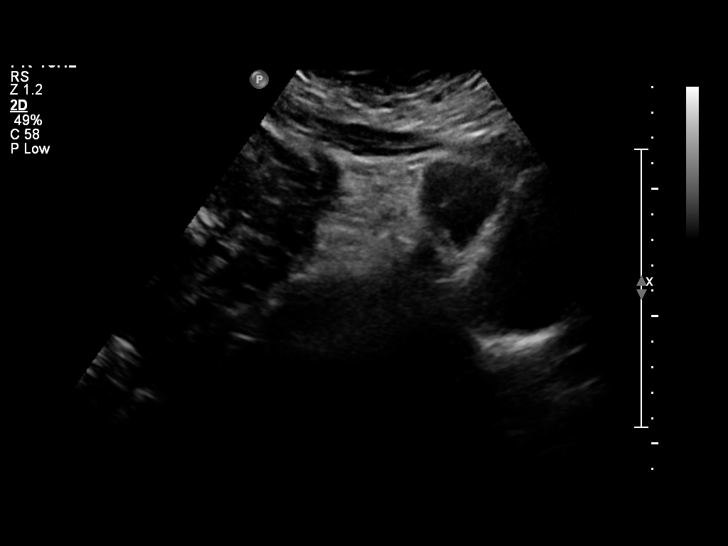
[im 25/25]
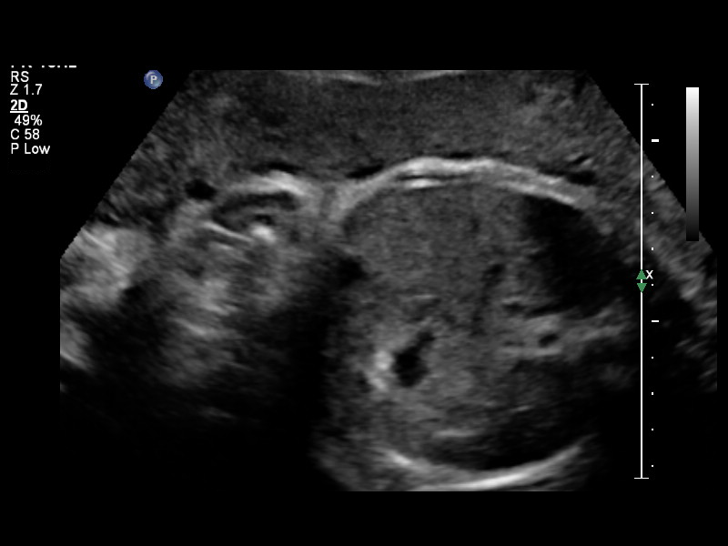

[14 of 25 positions shown; findings below may reference images not displayed]

IMPRESSION: See AS Obstetric US report.

## 2008-11-08 ENCOUNTER — Inpatient Hospital Stay (HOSPITAL_COMMUNITY): Admission: AD | Admit: 2008-11-08 | Discharge: 2008-11-14 | Payer: Self-pay | Admitting: Obstetrics

## 2009-10-16 LAB — HM PAP SMEAR: HM Pap smear: NORMAL

## 2009-10-19 ENCOUNTER — Ambulatory Visit: Payer: Self-pay | Admitting: Physician Assistant

## 2009-10-19 ENCOUNTER — Inpatient Hospital Stay (HOSPITAL_COMMUNITY): Admission: AD | Admit: 2009-10-19 | Discharge: 2009-10-20 | Payer: Self-pay | Admitting: Obstetrics & Gynecology

## 2009-10-31 ENCOUNTER — Ambulatory Visit (HOSPITAL_COMMUNITY): Admission: RE | Admit: 2009-10-31 | Discharge: 2009-10-31 | Payer: Self-pay | Admitting: Obstetrics

## 2009-10-31 IMAGING — US US OB DETAIL+14 WK
1 series · 14 of 28 positions shown · non-contrast
Comparison: none

OBSTETRICAL ULTRASOUND:
 This ultrasound was performed in The [[facility]OSP[facility]], and the AS OB/GYN report will be stored to [REDACTED] PACS.  This report is also available in [HOSPITAL]?s accessANYware.

[Series 1: us ob detail+14 wk · 14 of 94 slices shown]
[im 4/94]
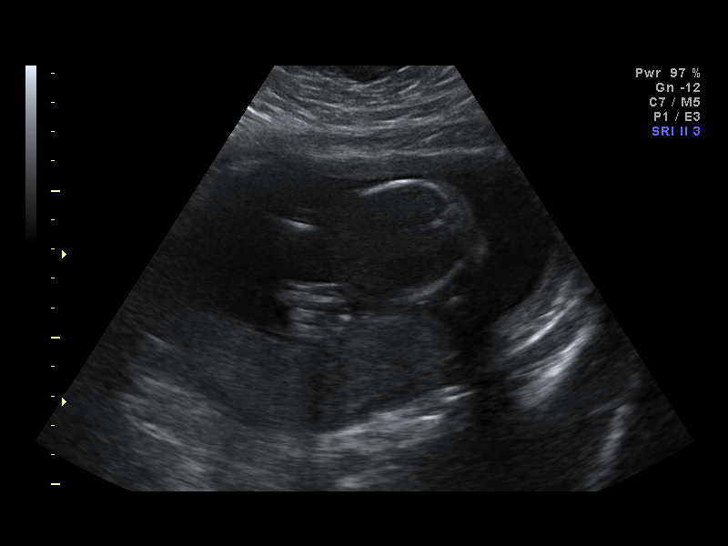
[im 11/94]
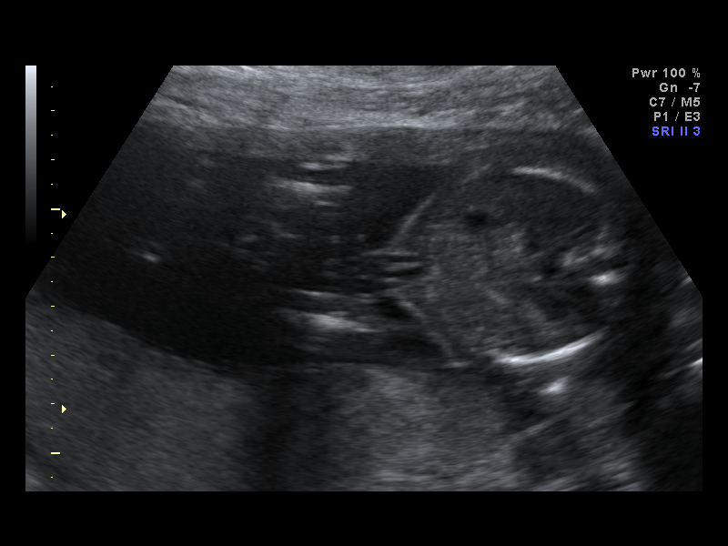
[im 18/94]
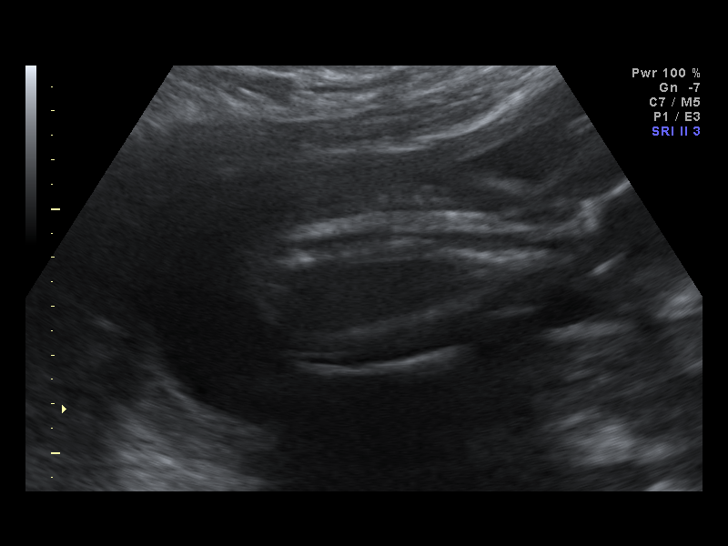
[im 25/94]
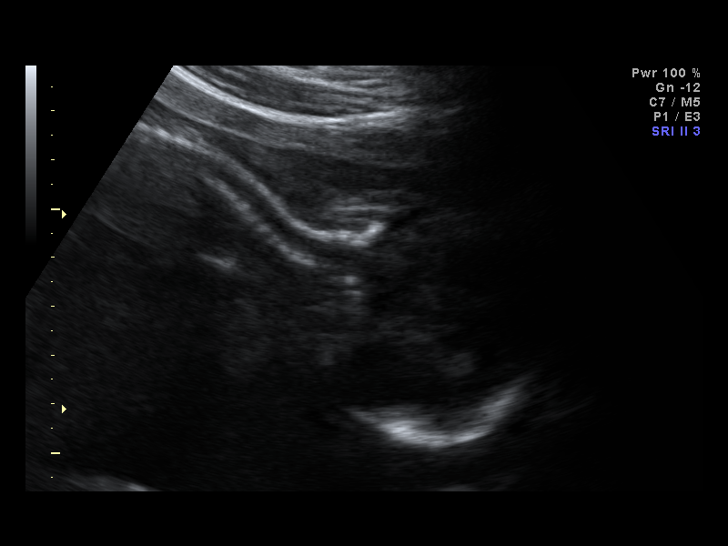
[im 32/94]
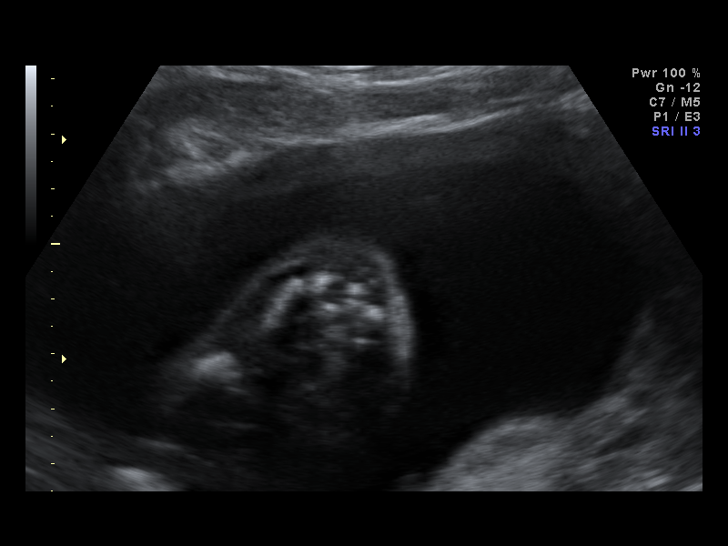
[im 38/94]
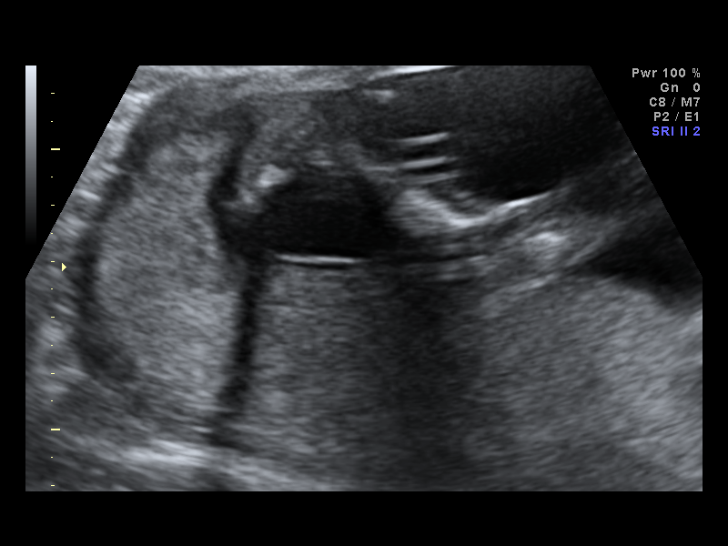
[im 45/94]
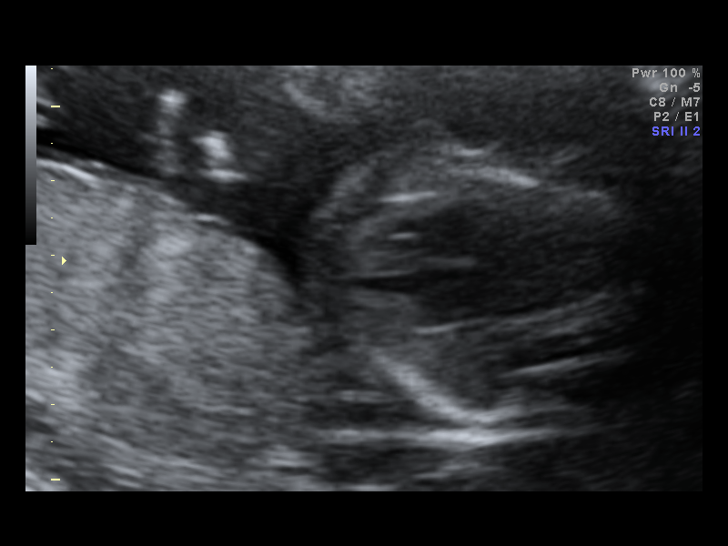
[im 52/94]
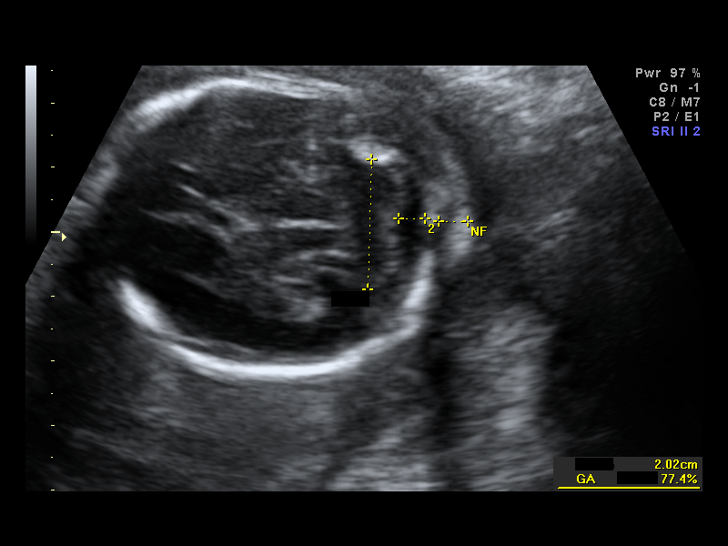
[im 59/94]
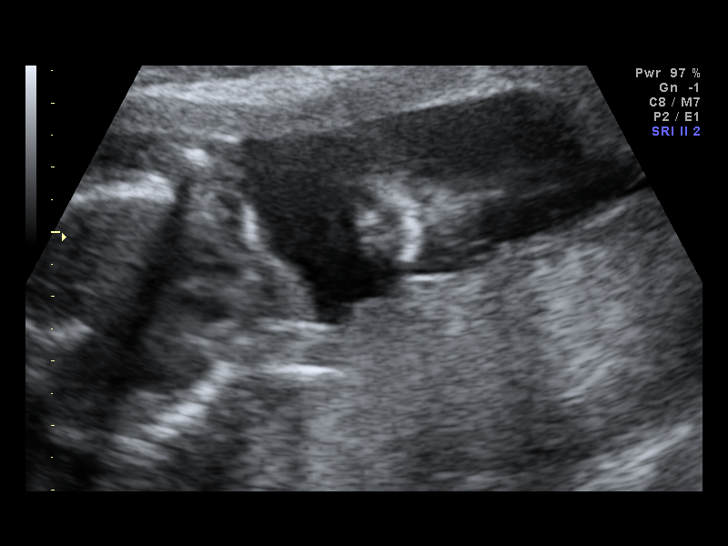
[im 66/94]
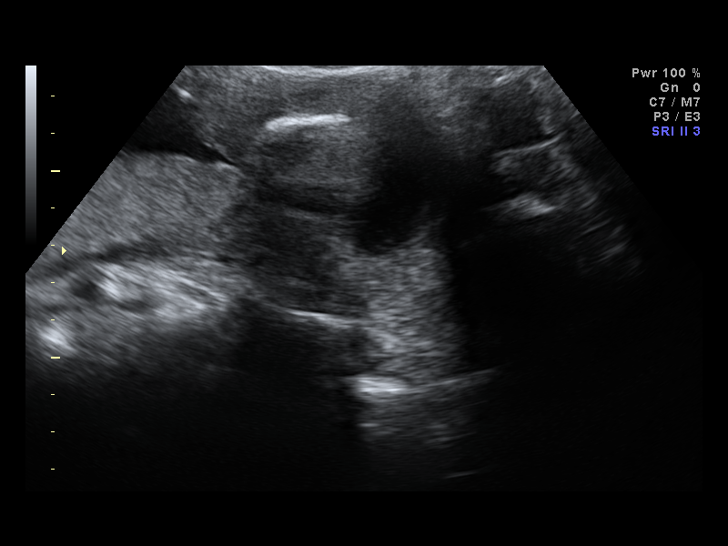
[im 73/94]
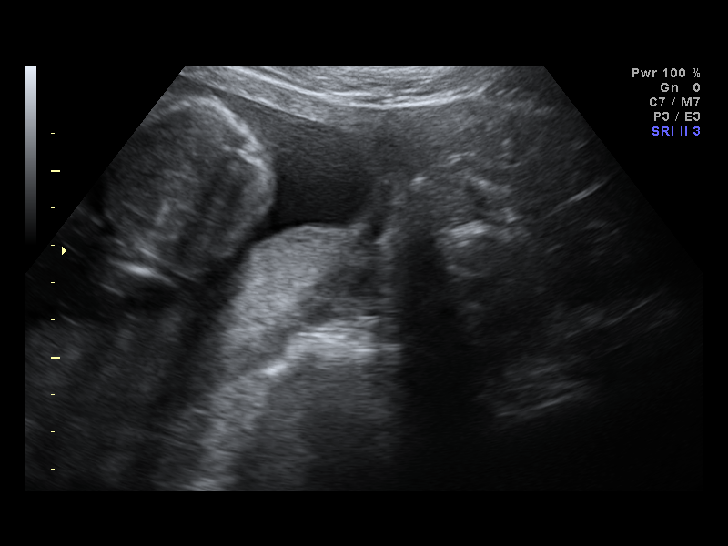
[im 80/94]
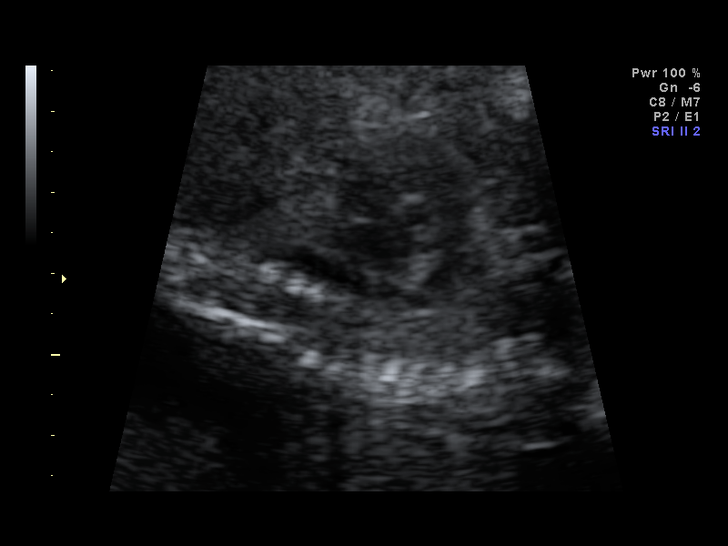
[im 87/94]
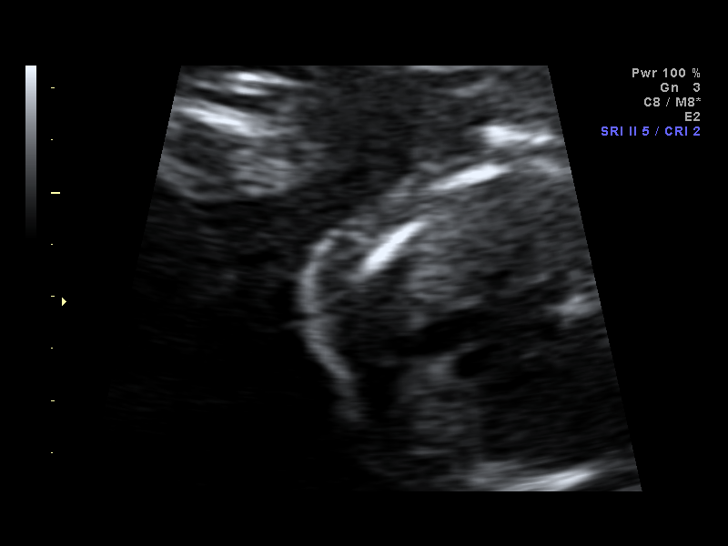
[im 94/94]
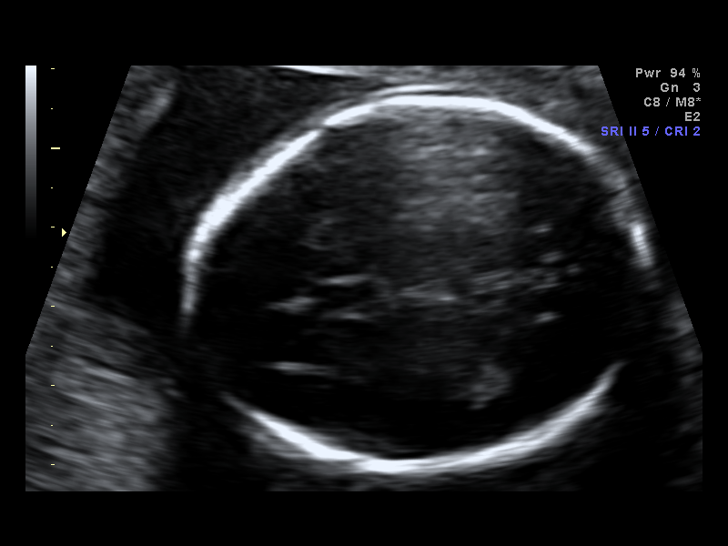

[14 of 28 positions shown; findings below may reference images not displayed]

IMPRESSION: AS OB/GYN has also been faxed to the ordering physician.

## 2009-12-12 ENCOUNTER — Ambulatory Visit (HOSPITAL_COMMUNITY): Admission: RE | Admit: 2009-12-12 | Discharge: 2009-12-12 | Payer: Self-pay | Admitting: Obstetrics

## 2009-12-12 IMAGING — US US OB FOLLOW-UP
1 series · 14 of 28 positions shown · non-contrast
Comparison: none

OBSTETRICAL ULTRASOUND:
 This ultrasound was performed in The [[facility]OSP[facility]], and the AS OB/GYN report will be stored to [REDACTED] PACS.  This report is also available in [HOSPITAL]?s accessANYware.

[Series 1: us ob follow-up · 14 of 54 slices shown]
[im 2/54]
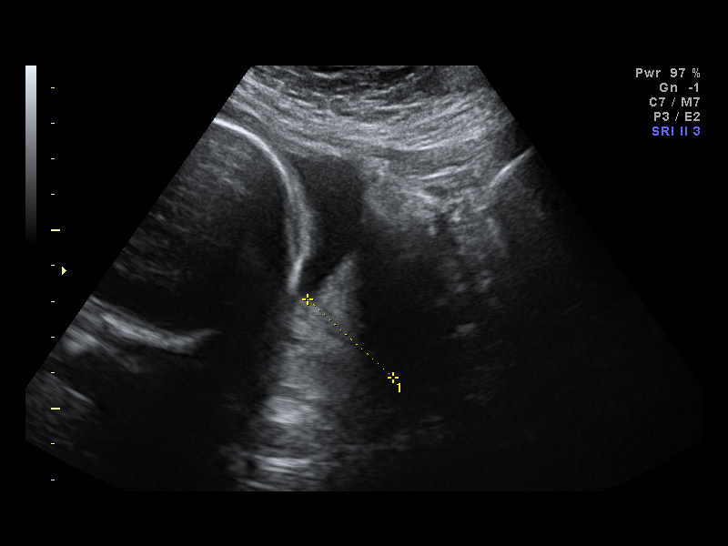
[im 6/54]
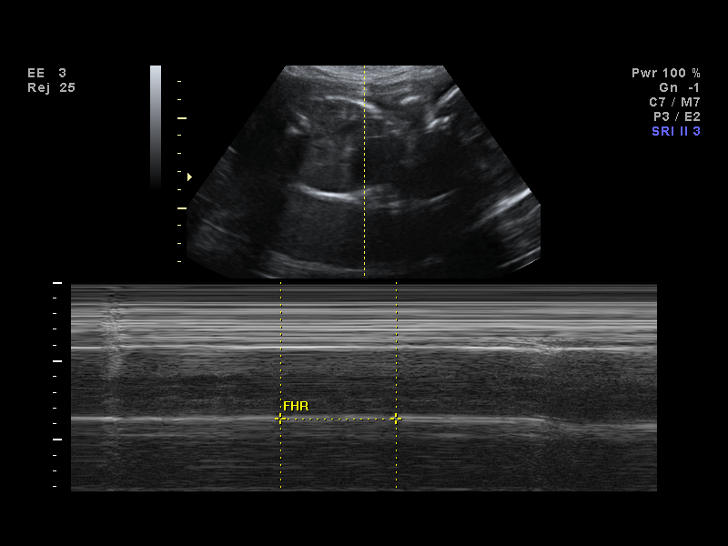
[im 10/54]
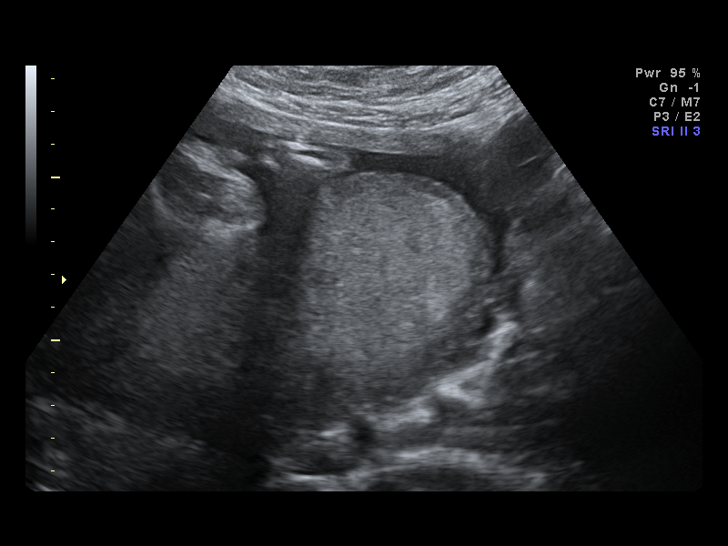
[im 14/54]
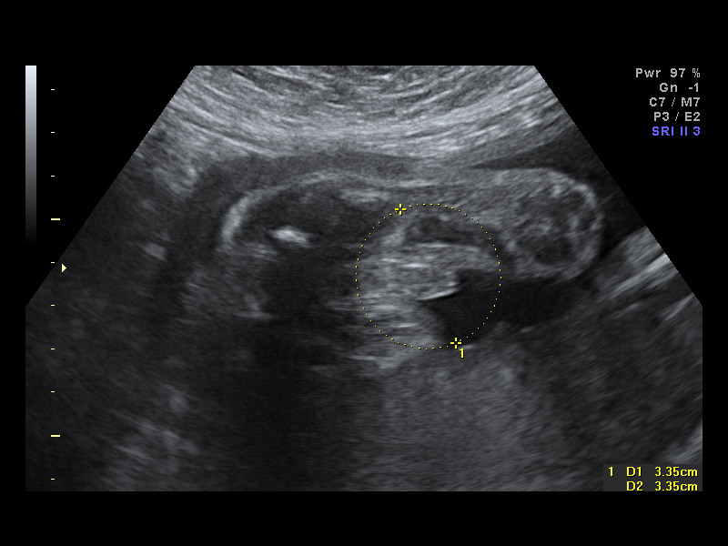
[im 18/54]
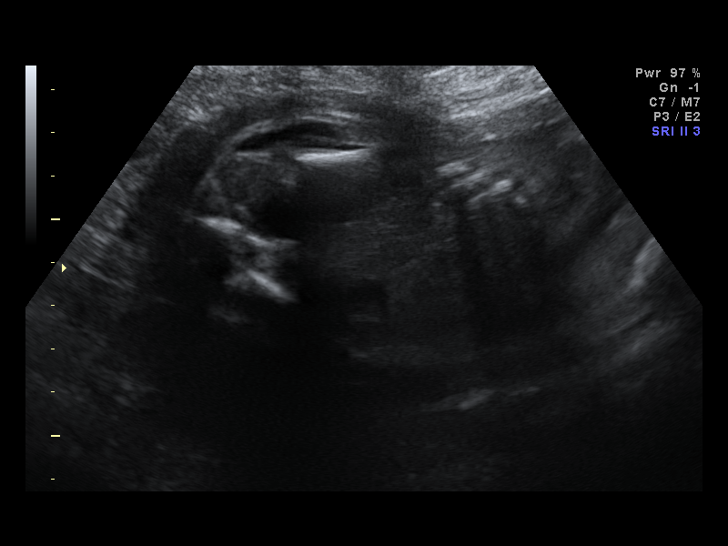
[im 22/54]
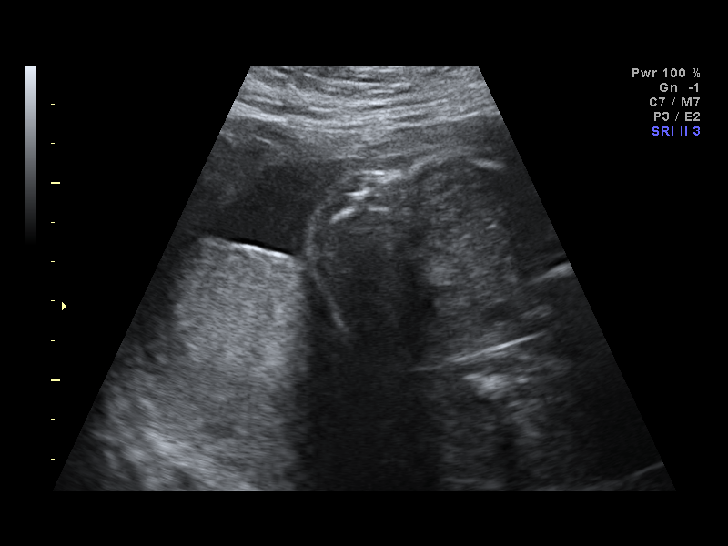
[im 26/54]
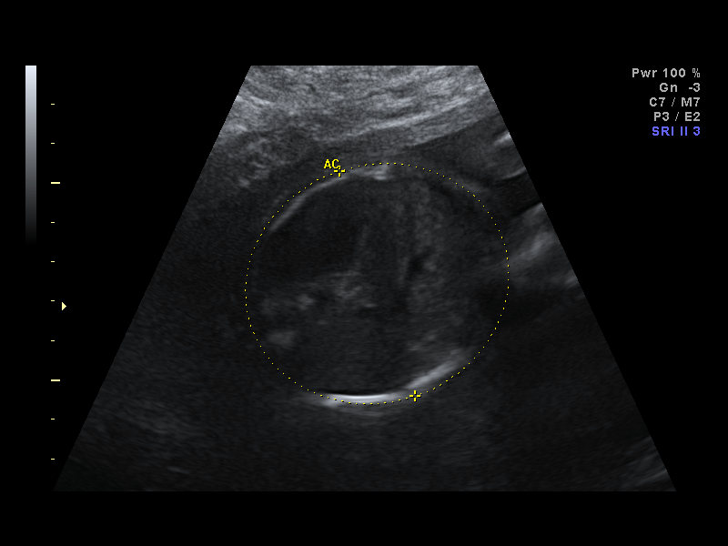
[im 30/54]
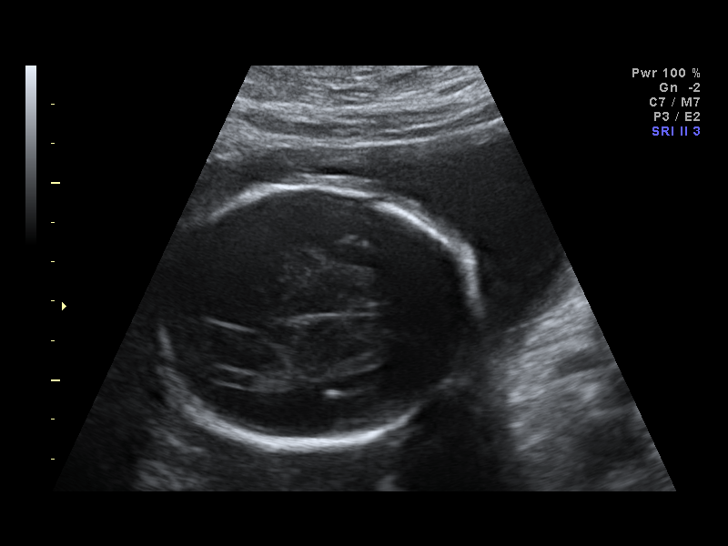
[im 34/54]
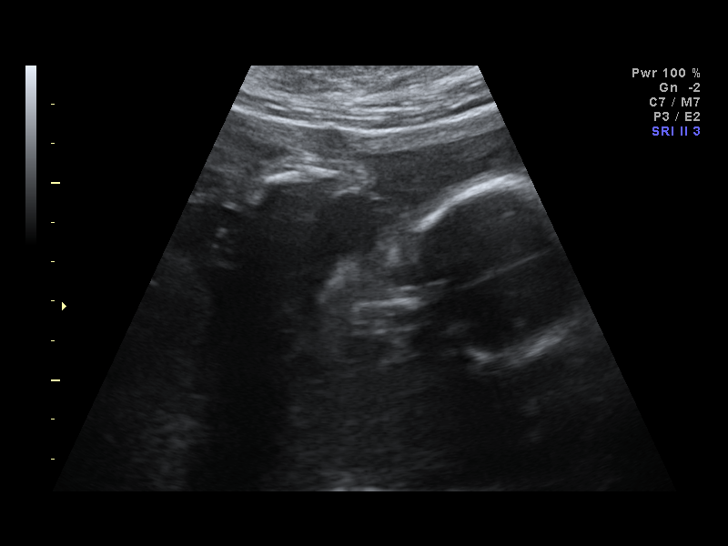
[im 38/54]
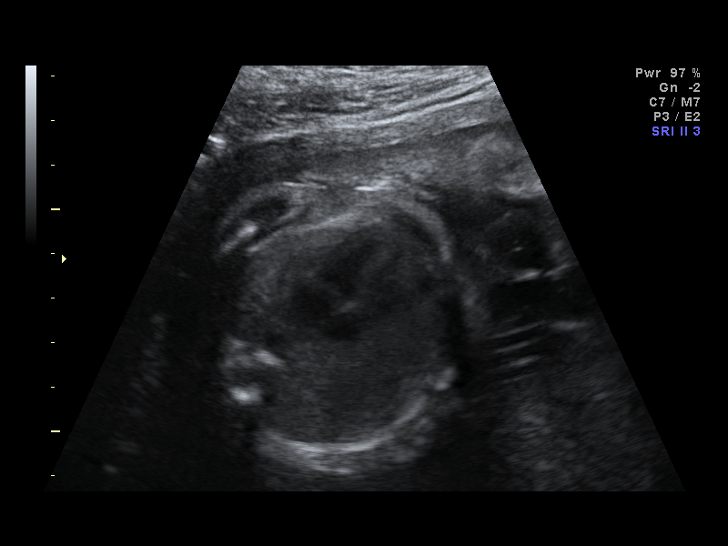
[im 42/54]
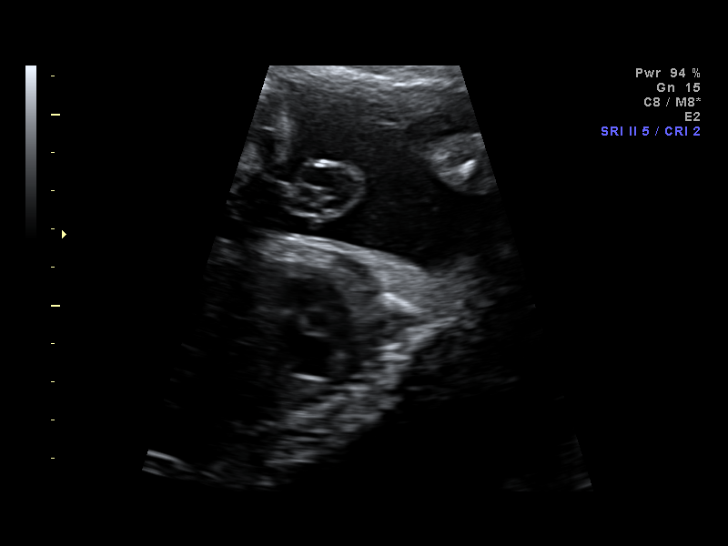
[im 46/54]
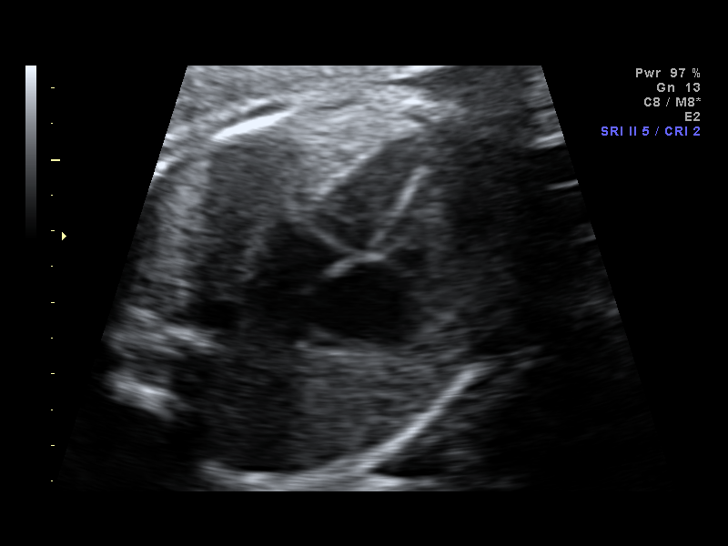
[im 50/54]
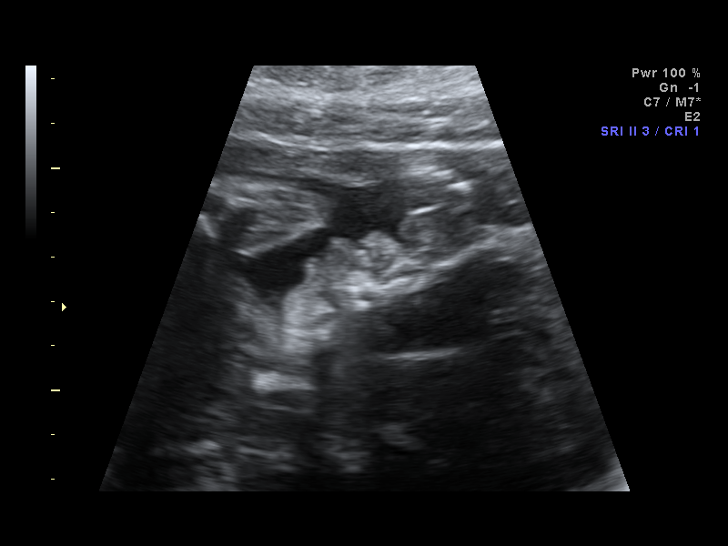
[im 54/54]
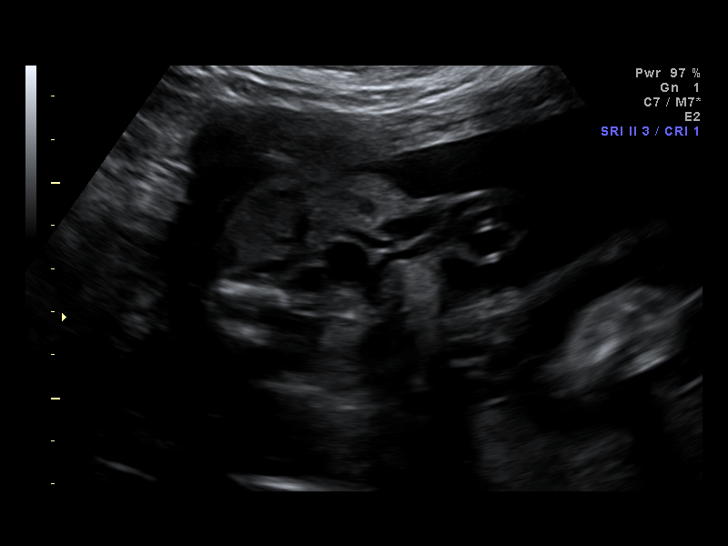

[14 of 28 positions shown; findings below may reference images not displayed]

IMPRESSION: AS OB/GYN has also been faxed to the ordering physician.

## 2009-12-17 ENCOUNTER — Emergency Department (HOSPITAL_BASED_OUTPATIENT_CLINIC_OR_DEPARTMENT_OTHER): Admission: EM | Admit: 2009-12-17 | Discharge: 2009-12-17 | Payer: Self-pay | Admitting: Emergency Medicine

## 2010-01-23 ENCOUNTER — Ambulatory Visit (HOSPITAL_COMMUNITY): Admission: RE | Admit: 2010-01-23 | Discharge: 2010-01-23 | Payer: Self-pay | Admitting: Obstetrics

## 2010-01-23 IMAGING — US US OB FOLLOW-UP
1 series · 14 of 28 positions shown · non-contrast
Comparison: none

OBSTETRICAL ULTRASOUND:
 This ultrasound was performed in The [[facility]OSPITAL], and the AS OB/GYN report will be stored to [REDACTED] PACS.  This report is also available in [HOSPITAL]?s accessANYware.

[Series 1: us ob follow-up · 14 of 44 slices shown]
[im 2/44]
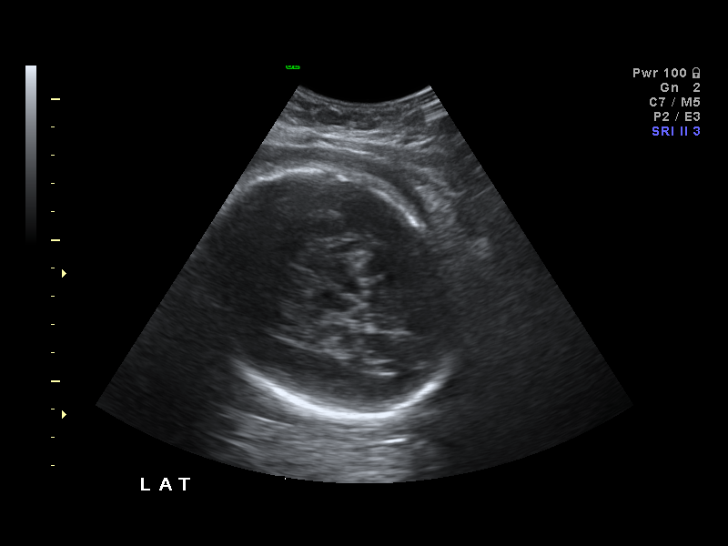
[im 5/44]
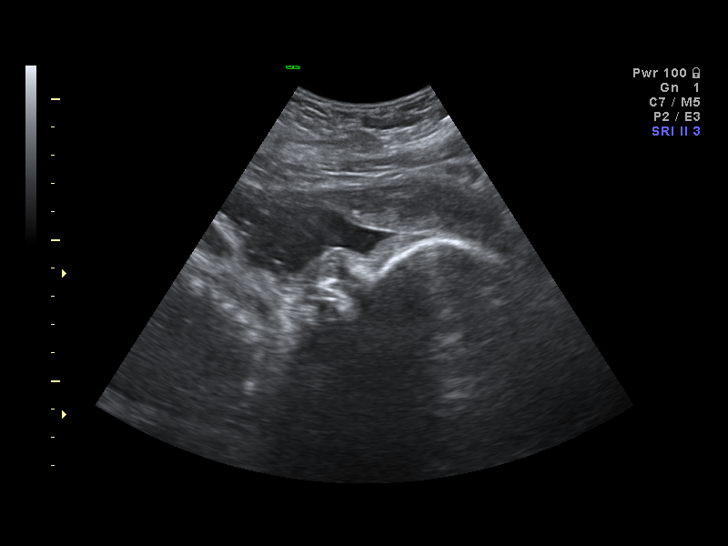
[im 8/44]
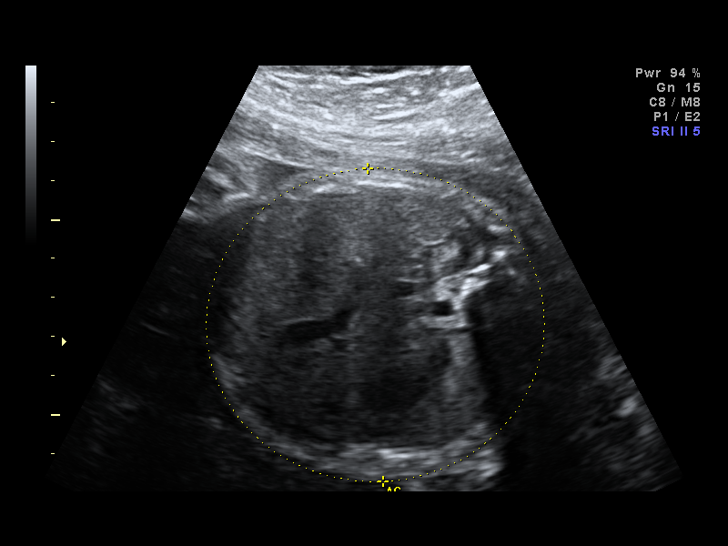
[im 12/44]
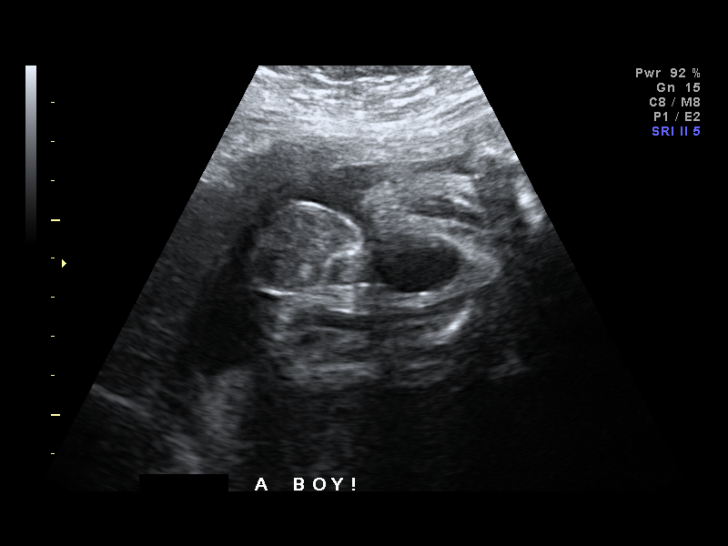
[im 15/44]
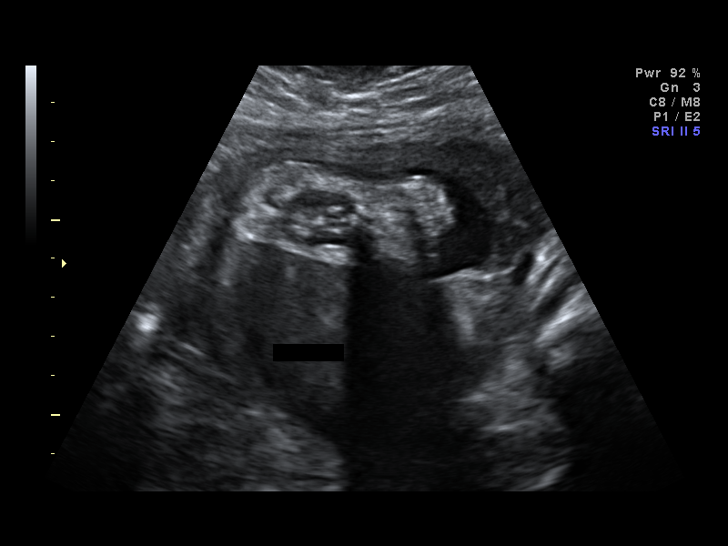
[im 18/44]
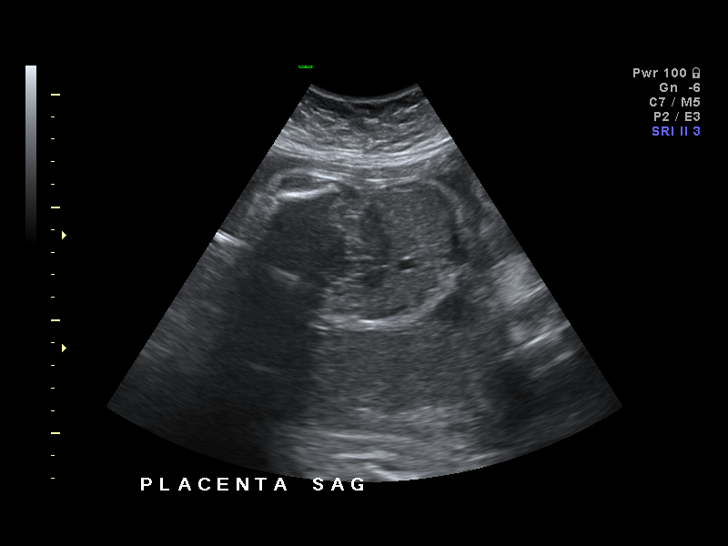
[im 21/44]
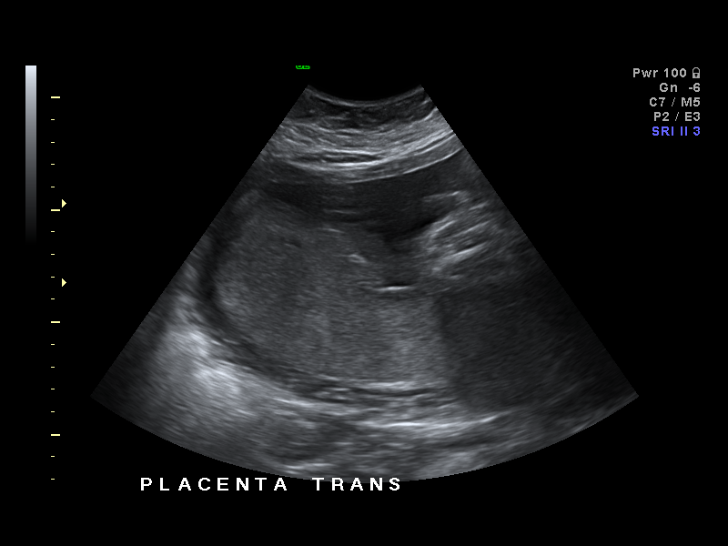
[im 24/44]
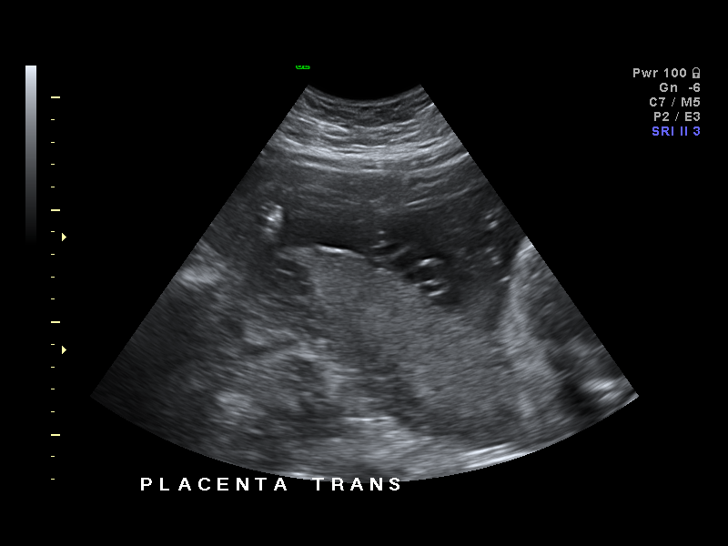
[im 28/44]
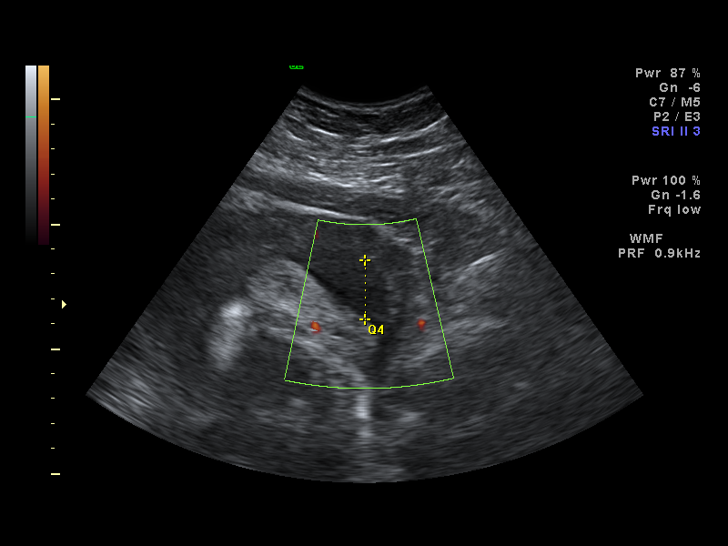
[im 31/44]
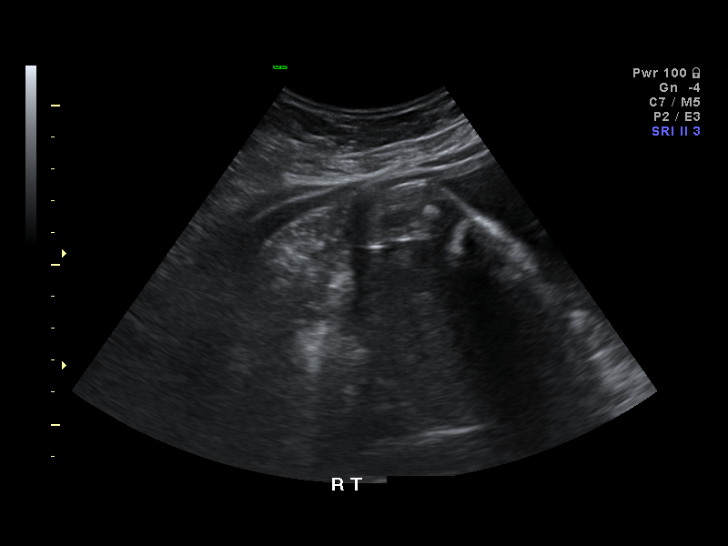
[im 34/44]
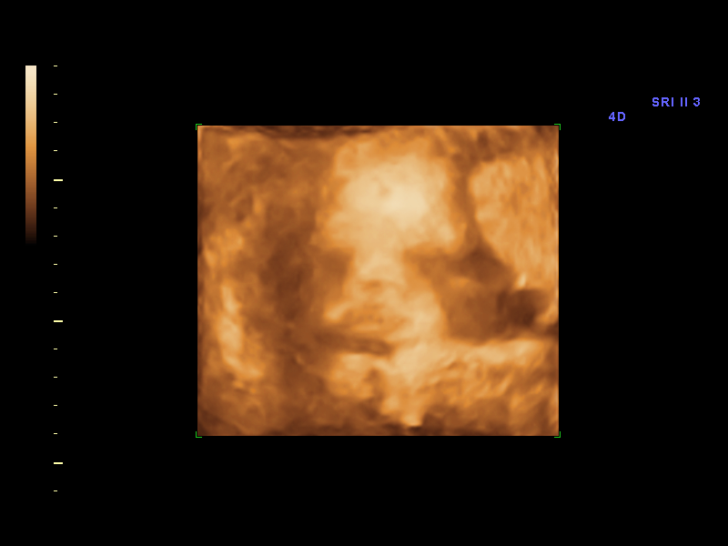
[im 37/44]
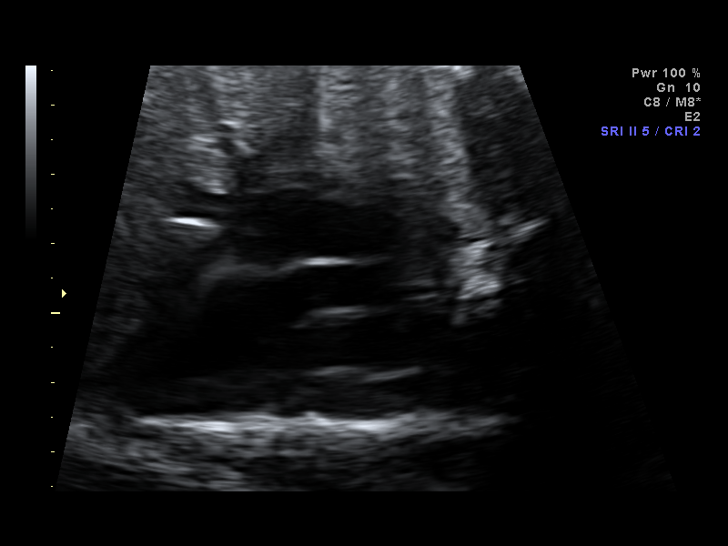
[im 40/44]
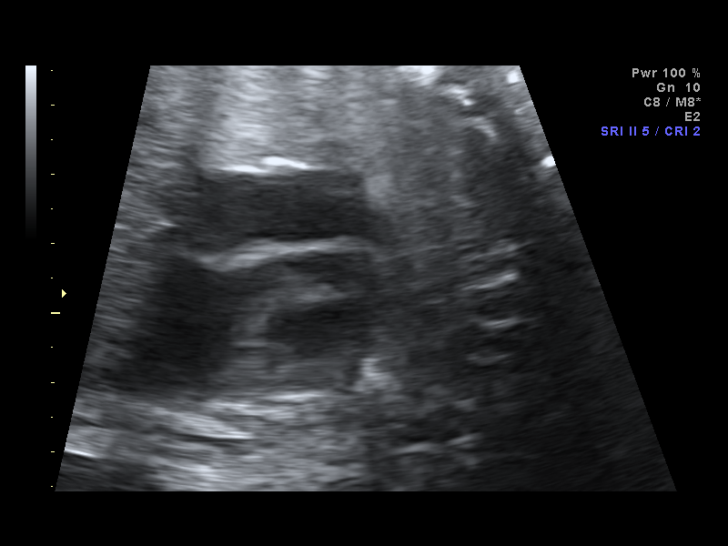
[im 44/44]
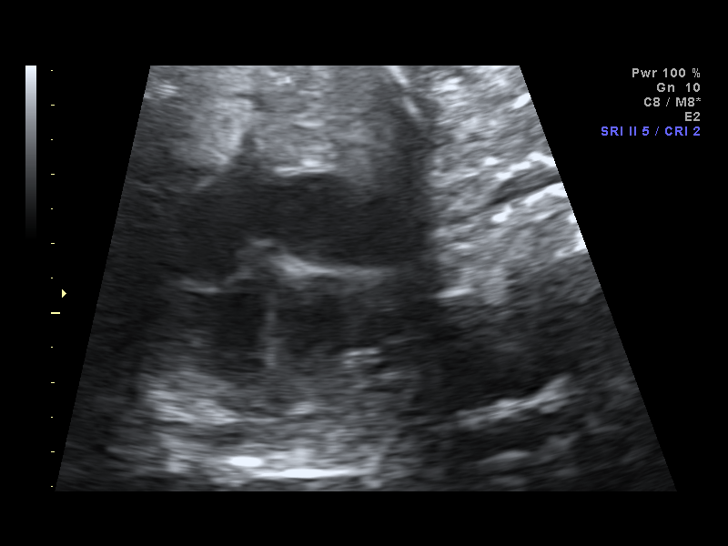

[14 of 28 positions shown; findings below may reference images not displayed]

IMPRESSION: AS OB/GYN has also been faxed to the ordering physician.

## 2010-01-26 ENCOUNTER — Ambulatory Visit: Payer: Self-pay | Admitting: Family

## 2010-01-26 ENCOUNTER — Inpatient Hospital Stay (HOSPITAL_COMMUNITY): Admission: AD | Admit: 2010-01-26 | Discharge: 2010-01-26 | Payer: Self-pay | Admitting: Obstetrics

## 2010-02-20 ENCOUNTER — Ambulatory Visit (HOSPITAL_COMMUNITY): Admission: RE | Admit: 2010-02-20 | Discharge: 2010-02-20 | Payer: Self-pay | Admitting: Obstetrics

## 2010-02-20 IMAGING — US US OB FOLLOW-UP
1 series · 14 of 28 positions shown · non-contrast
Comparison: none

OBSTETRICAL ULTRASOUND:
 This ultrasound was performed in The [[facility]OSP[facility]], and the AS OB/GYN report will be stored to [REDACTED] PACS.  This report is also available in [HOSPITAL]?s accessANYware.

[Series 1: us ob follow-up · 14 of 41 slices shown]
[im 2/41]
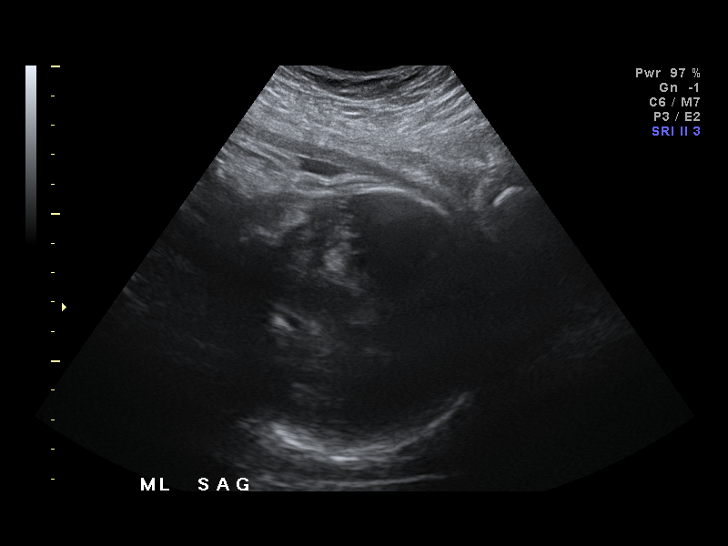
[im 5/41]
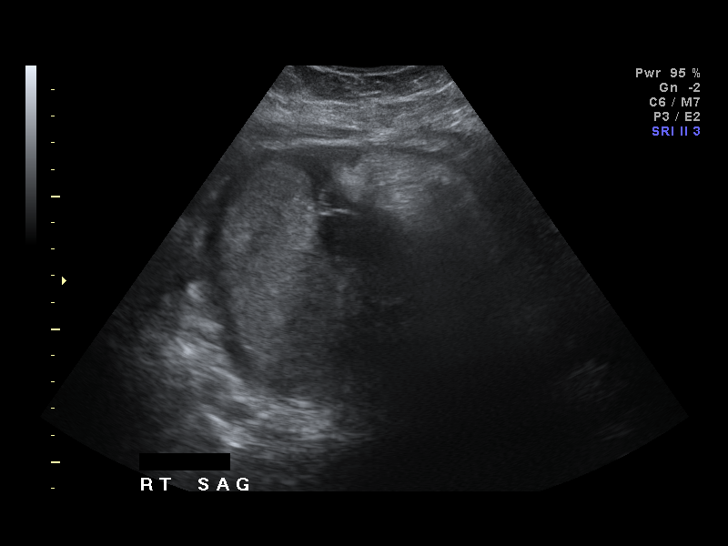
[im 8/41]
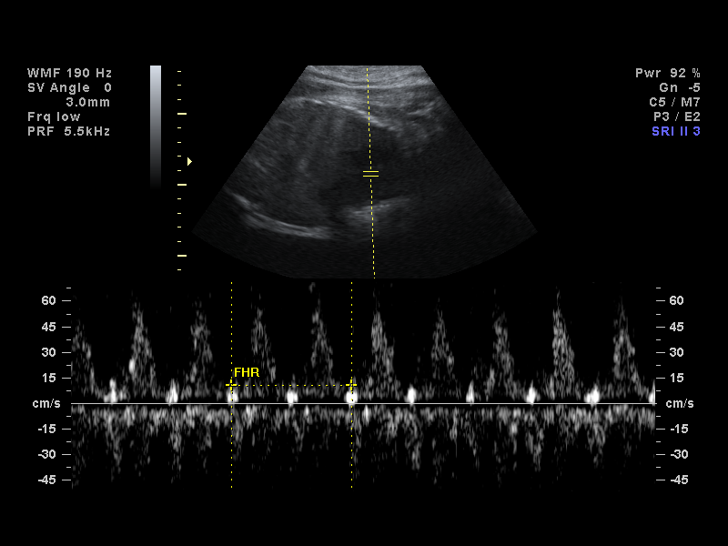
[im 11/41]
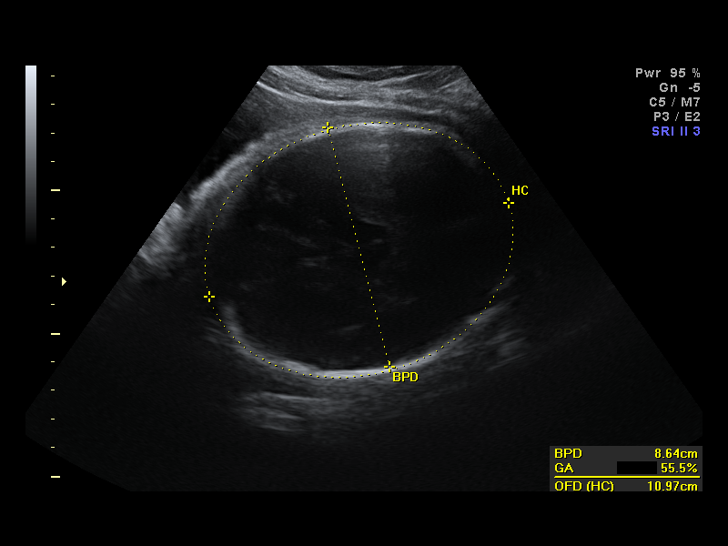
[im 14/41]
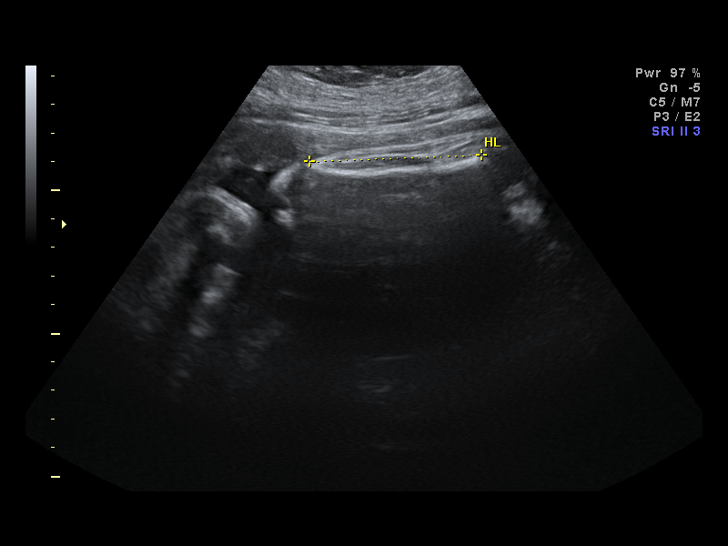
[im 17/41]
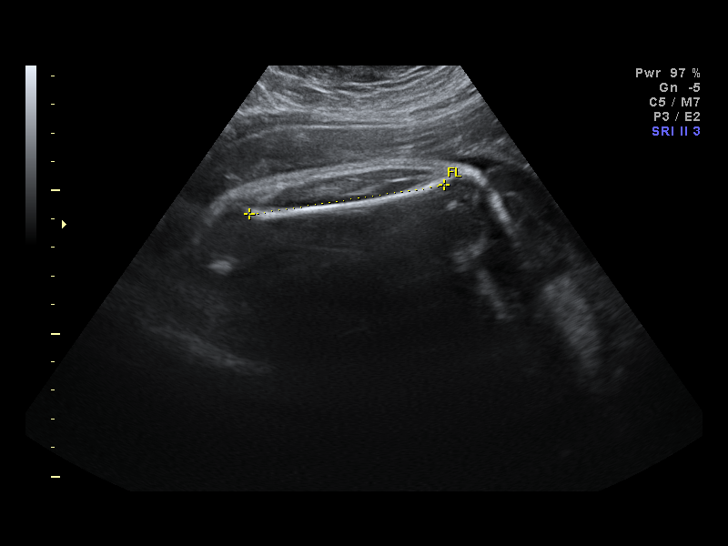
[im 20/41]
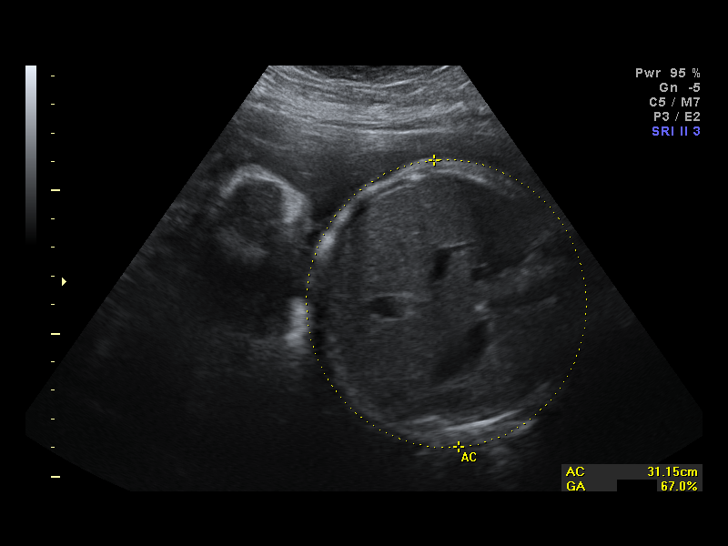
[im 23/41]
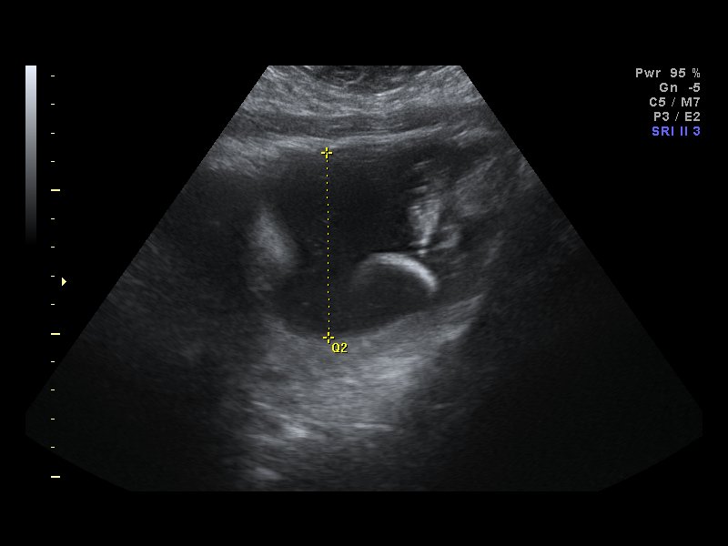
[im 26/41]
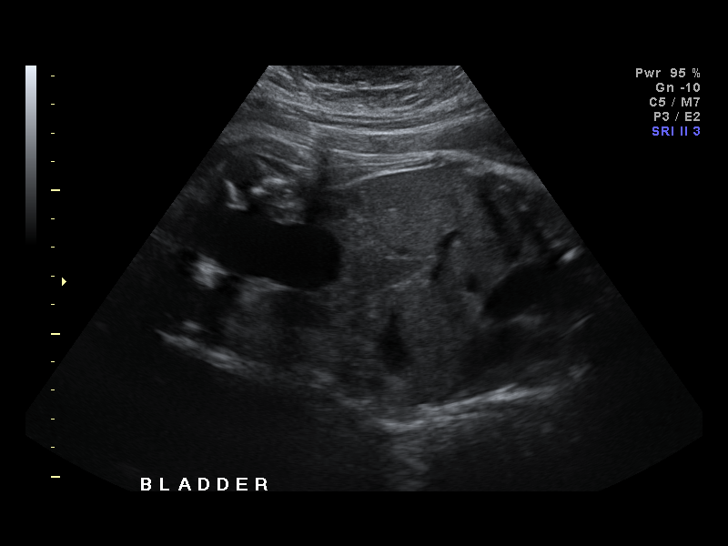
[im 29/41]
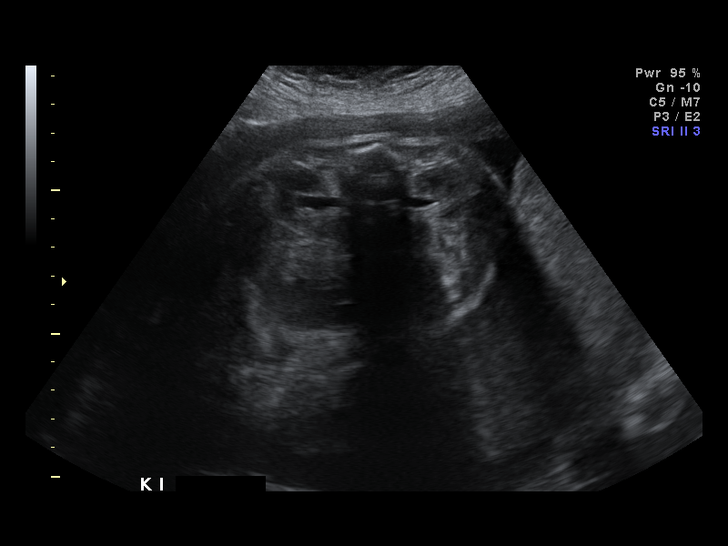
[im 32/41]
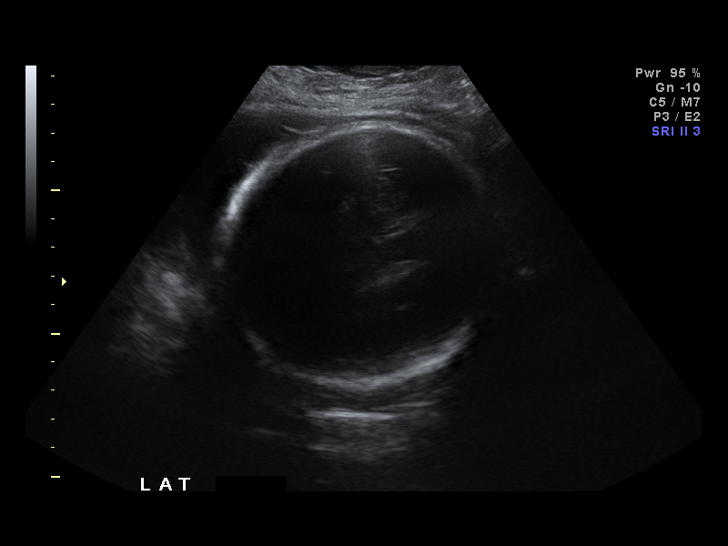
[im 35/41]
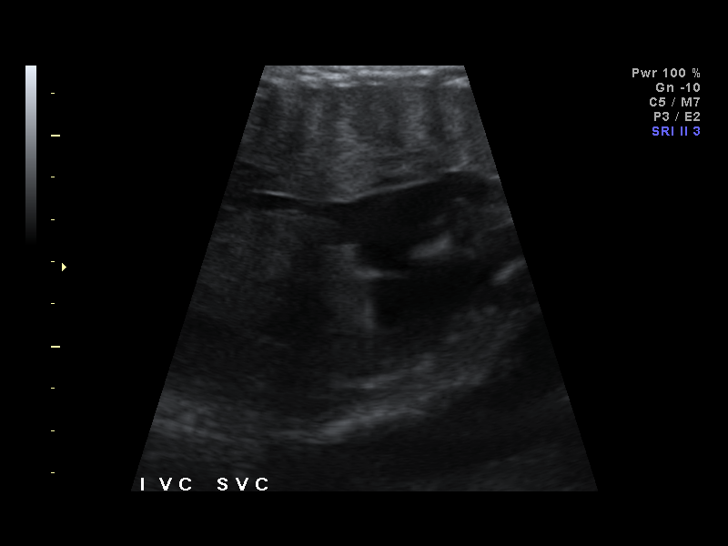
[im 38/41]
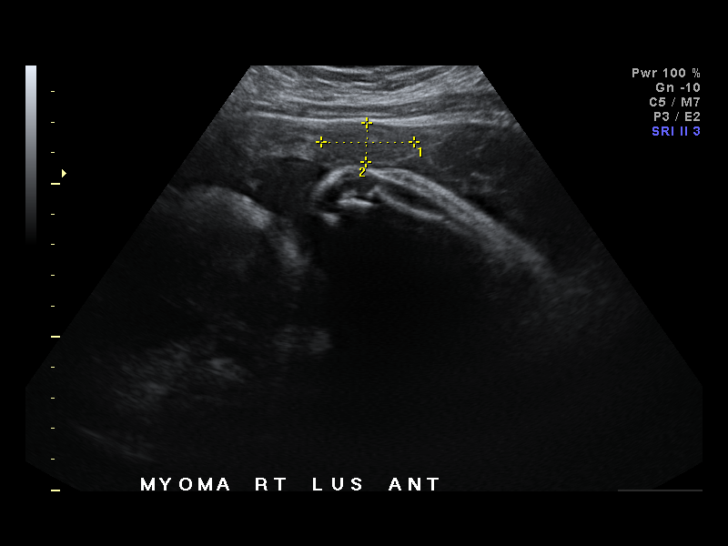
[im 41/41]
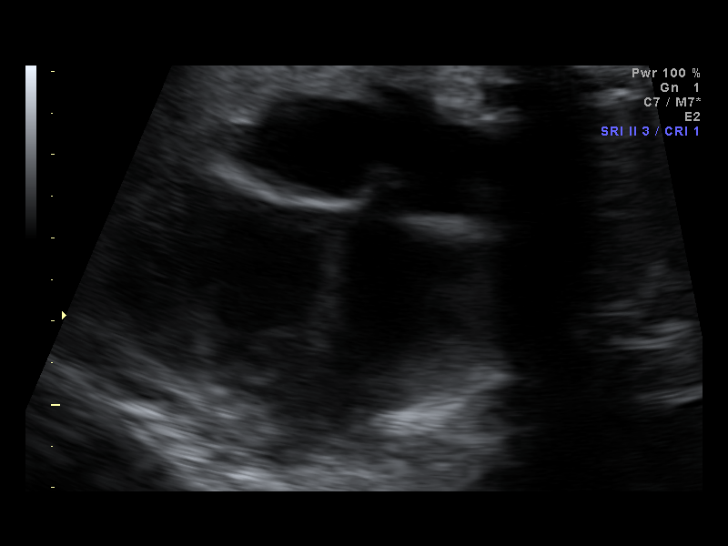

[14 of 28 positions shown; findings below may reference images not displayed]

IMPRESSION: AS OB/GYN has also been faxed to the ordering physician.

## 2010-03-03 ENCOUNTER — Encounter: Payer: Self-pay | Admitting: Obstetrics & Gynecology

## 2010-03-03 ENCOUNTER — Inpatient Hospital Stay (HOSPITAL_COMMUNITY): Admission: AD | Admit: 2010-03-03 | Discharge: 2010-03-06 | Payer: Self-pay | Admitting: Obstetrics & Gynecology

## 2010-06-08 ENCOUNTER — Encounter: Payer: Self-pay | Admitting: Obstetrics & Gynecology

## 2010-06-09 ENCOUNTER — Encounter: Payer: Self-pay | Admitting: Obstetrics & Gynecology

## 2010-07-21 ENCOUNTER — Emergency Department (HOSPITAL_BASED_OUTPATIENT_CLINIC_OR_DEPARTMENT_OTHER)
Admission: EM | Admit: 2010-07-21 | Discharge: 2010-07-21 | Disposition: A | Discharge status: Home / Self Care | Payer: Medicaid Other | Attending: Emergency Medicine | Admitting: Emergency Medicine

## 2010-07-21 DIAGNOSIS — I10 Essential (primary) hypertension: Secondary | ICD-10-CM | POA: Insufficient documentation

## 2010-07-21 LAB — COMPREHENSIVE METABOLIC PANEL
ALT: 22 U/L (ref 0–35)
AST: 19 U/L (ref 0–37)
Alkaline Phosphatase: 101 U/L (ref 39–117)
CO2: 27 mEq/L (ref 19–32)
Chloride: 103 mEq/L (ref 96–112)
GFR calc Af Amer: >60 mL/min (ref >60)
GFR calc non Af Amer: >60 mL/min (ref >60)
Glucose, Bld: 96 mg/dL (ref 70–99)
Potassium: 3.8 mEq/L (ref 3.5–5.1)
Sodium: 141 mEq/L (ref 135–145)

## 2010-07-21 LAB — URINALYSIS, ROUTINE W REFLEX MICROSCOPIC
Bilirubin Urine: NEGATIVE
Glucose, UA: NEGATIVE mg/dL
Hgb urine dipstick: NEGATIVE
Ketones, ur: NEGATIVE mg/dL
Protein, ur: NEGATIVE mg/dL
Urobilinogen, UA: 0.2 mg/dL (ref 0.0–1.0)

## 2010-07-21 LAB — URINE MICROSCOPIC-ADD ON

## 2010-07-30 LAB — CBC
HCT: 35.7 % — ABNORMAL LOW (ref 36.0–46.0)
Hemoglobin: 10.5 g/dL — ABNORMAL LOW (ref 12.0–15.0)
MCHC: 33 g/dL (ref 30.0–36.0)
Platelets: 245 10*3/uL (ref 150–400)
Platelets: 273 10*3/uL (ref 150–400)
RBC: 3.8 MIL/uL — ABNORMAL LOW (ref 3.87–5.11)
RDW: 14.9 % (ref 11.5–15.5)
WBC: 11.6 10*3/uL — ABNORMAL HIGH (ref 4.0–10.5)
WBC: 8.5 10*3/uL (ref 4.0–10.5)

## 2010-07-30 LAB — COMPREHENSIVE METABOLIC PANEL
ALT: 16 U/L (ref 0–35)
Albumin: 2.8 g/dL — ABNORMAL LOW (ref 3.5–5.2)
Alkaline Phosphatase: 160 U/L — ABNORMAL HIGH (ref 39–117)
BUN: 4 mg/dL — ABNORMAL LOW (ref 6–23)
Calcium: 9.5 mg/dL (ref 8.4–10.5)
Glucose, Bld: 81 mg/dL (ref 70–99)
Potassium: 3.9 mEq/L (ref 3.5–5.1)
Sodium: 132 mEq/L — ABNORMAL LOW (ref 135–145)
Total Protein: 6.8 g/dL (ref 6.0–8.3)

## 2010-07-30 LAB — LACTATE DEHYDROGENASE: LDH: 129 U/L (ref 94–250)

## 2010-07-30 LAB — RPR: RPR Ser Ql: NONREACTIVE

## 2010-07-31 LAB — URINALYSIS, ROUTINE W REFLEX MICROSCOPIC
Glucose, UA: NEGATIVE mg/dL
Hgb urine dipstick: NEGATIVE
Protein, ur: NEGATIVE mg/dL
Specific Gravity, Urine: 1.01 (ref 1.005–1.030)

## 2010-08-01 LAB — URINALYSIS, ROUTINE W REFLEX MICROSCOPIC
Bilirubin Urine: NEGATIVE
Glucose, UA: NEGATIVE mg/dL
Hgb urine dipstick: NEGATIVE
Ketones, ur: 15 mg/dL — AB
Nitrite: POSITIVE — AB
Protein, ur: NEGATIVE mg/dL
Specific Gravity, Urine: 1.026 (ref 1.005–1.030)
Urobilinogen, UA: 1 mg/dL (ref 0.0–1.0)
pH: 6.5 (ref 5.0–8.0)

## 2010-08-01 LAB — URINE MICROSCOPIC-ADD ON

## 2010-08-04 LAB — GC/CHLAMYDIA PROBE AMP, GENITAL
Chlamydia, DNA Probe: NEGATIVE
GC Probe Amp, Genital: NEGATIVE

## 2010-08-04 LAB — URINE MICROSCOPIC-ADD ON: RBC / HPF: NONE SEEN RBC/hpf (ref <3)

## 2010-08-04 LAB — URINE CULTURE

## 2010-08-04 LAB — URINALYSIS, ROUTINE W REFLEX MICROSCOPIC
Bilirubin Urine: NEGATIVE
Glucose, UA: NEGATIVE mg/dL
Ketones, ur: NEGATIVE mg/dL
Protein, ur: NEGATIVE mg/dL
pH: 6 (ref 5.0–8.0)

## 2010-08-04 LAB — WET PREP, GENITAL
Trich, Wet Prep: NONE SEEN
Yeast Wet Prep HPF POC: NONE SEEN

## 2010-08-25 ENCOUNTER — Emergency Department (INDEPENDENT_AMBULATORY_CARE_PROVIDER_SITE_OTHER): Payer: No Typology Code available for payment source

## 2010-08-25 ENCOUNTER — Emergency Department (HOSPITAL_BASED_OUTPATIENT_CLINIC_OR_DEPARTMENT_OTHER)
Admission: EM | Admit: 2010-08-25 | Discharge: 2010-08-25 | Disposition: A | Discharge status: Home / Self Care | Payer: No Typology Code available for payment source | Attending: Emergency Medicine | Admitting: Emergency Medicine

## 2010-08-25 DIAGNOSIS — M25519 Pain in unspecified shoulder: Secondary | ICD-10-CM

## 2010-08-25 DIAGNOSIS — I10 Essential (primary) hypertension: Secondary | ICD-10-CM | POA: Insufficient documentation

## 2010-08-25 DIAGNOSIS — T07XXXA Unspecified multiple injuries, initial encounter: Secondary | ICD-10-CM | POA: Insufficient documentation

## 2010-08-25 DIAGNOSIS — Y92410 Unspecified street and highway as the place of occurrence of the external cause: Secondary | ICD-10-CM | POA: Insufficient documentation

## 2010-08-25 DIAGNOSIS — M25569 Pain in unspecified knee: Secondary | ICD-10-CM | POA: Insufficient documentation

## 2010-08-25 LAB — URINALYSIS, DIPSTICK ONLY
Bilirubin Urine: NEGATIVE
Glucose, UA: NEGATIVE mg/dL
Hgb urine dipstick: NEGATIVE
Nitrite: NEGATIVE
Specific Gravity, Urine: 1.01 (ref 1.005–1.030)
pH: 6.5 (ref 5.0–8.0)

## 2010-08-25 LAB — CBC
HCT: 34.1 % — ABNORMAL LOW (ref 36.0–46.0)
HCT: 35.6 % — ABNORMAL LOW (ref 36.0–46.0)
Hemoglobin: 11.6 g/dL — ABNORMAL LOW (ref 12.0–15.0)
Hemoglobin: 12.1 g/dL (ref 12.0–15.0)
MCHC: 34 g/dL (ref 30.0–36.0)
MCHC: 34 g/dL (ref 30.0–36.0)
RBC: 3.69 MIL/uL — ABNORMAL LOW (ref 3.87–5.11)
RBC: 3.91 MIL/uL (ref 3.87–5.11)
RBC: 4.1 MIL/uL (ref 3.87–5.11)
RDW: 15.3 % (ref 11.5–15.5)
RDW: 15.4 % (ref 11.5–15.5)
WBC: 8.5 10*3/uL (ref 4.0–10.5)

## 2010-08-25 LAB — COMPREHENSIVE METABOLIC PANEL
ALT: 21 U/L (ref 0–35)
Alkaline Phosphatase: 95 U/L (ref 39–117)
BUN: 2 mg/dL — ABNORMAL LOW (ref 6–23)
CO2: 22 mEq/L (ref 19–32)
Calcium: 9.2 mg/dL (ref 8.4–10.5)
GFR calc non Af Amer: >60 mL/min (ref >60)
Glucose, Bld: 75 mg/dL (ref 70–99)
Sodium: 135 mEq/L (ref 135–145)

## 2010-08-25 LAB — LACTATE DEHYDROGENASE: LDH: 107 U/L (ref 94–250)
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
## 2010-08-27 LAB — DIFFERENTIAL
Basophils Absolute: 0.2 10*3/uL — ABNORMAL HIGH (ref 0.0–0.1)
Eosinophils Absolute: 0.3 10*3/uL (ref 0.0–0.7)
Eosinophils Relative: 4 % (ref 0–5)
Lymphs Abs: 1.5 10*3/uL (ref 0.7–4.0)

## 2010-08-27 LAB — CBC
RBC: 4.22 MIL/uL (ref 3.87–5.11)
WBC: 8.8 10*3/uL (ref 4.0–10.5)

## 2010-08-27 LAB — COMPREHENSIVE METABOLIC PANEL
ALT: 20 U/L (ref 0–35)
AST: 24 U/L (ref 0–37)
CO2: 24 mEq/L (ref 19–32)
Chloride: 107 mEq/L (ref 96–112)
GFR calc Af Amer: >60 mL/min (ref >60)
GFR calc non Af Amer: >60 mL/min (ref >60)
Potassium: 3.9 mEq/L (ref 3.5–5.1)
Sodium: 139 mEq/L (ref 135–145)
Total Bilirubin: 0.5 mg/dL (ref 0.3–1.2)

## 2010-08-27 LAB — URINALYSIS, ROUTINE W REFLEX MICROSCOPIC
Glucose, UA: NEGATIVE mg/dL
Ketones, ur: 40 mg/dL — AB
Protein, ur: NEGATIVE mg/dL

## 2010-08-28 ENCOUNTER — Ambulatory Visit: Payer: No Typology Code available for payment source | Admitting: Internal Medicine

## 2010-09-30 NOTE — Discharge Summary (Signed)
Carmen [name], Carmen [name]              ACCOUNT NO.:  [id number]   MEDICAL RECORD NO.:  [id number]          PATIENT TYPE:  INP   LOCATION:  9123                          FACILITY:  [facility]   PHYSICIAN:  Charles A. Clearance Coots, M.D.DATE OF BIRTH:  07-06-1980   DATE OF ADMISSION:  11/08/2008  DATE OF DISCHARGE:  11/14/2008                               DISCHARGE SUMMARY   ADMITTING DIAGNOSES:  1. A 38 weeks' gestation.  2. Oligohydramnios.   DISCHARGE DIAGNOSES:  1. A 38 weeks' gestation.  2. Oligohydramnios.  3. Status post primary low transverse cesarean section on November 11, 2008, for active herpes outbreak in labor.  Viable female was      delivered at 07:54.  Apgars were 8 at 1 minute and 9 at 5 minutes,      weight of 3095 g, length of 50.80 cm.  Mother and infant discharged      home in good condition.   REASON FOR ADMISSION:  A 30 year old, G3, P0, estimated date of  confinement of November 21, 2008, presented to [facility] with complaint  of leaking fluid.  Ultrasound was obtained and AFI was 6 cm.  Previous  amniotic fluid index was 10 cm.  The patient has had a history of  questionable leaking during this pregnancy.  Group B strep was negative.   SURGERY:  None.   ILLNESSES:  Hypertension and murmur   MEDICATIONS:  Prenatal vitamins and Valtrex.   ALLERGIES:  No known drug allergies.   GYNECOLOGIC HISTORY:  Significant for genital herpes.  Has been taking  Valtrex suppression during the pregnancy.   SOCIAL HISTORY:  Single.  Negative tobacco, alcohol, or recreational  drug use.   FAMILY HISTORY:  Remarkable for cardiovascular disease and cancer.   PHYSICAL EXAMINATION:  GENERAL:  A well-nourished, well-developed female  in no acute distress.  VITAL SIGNS:  Afebrile.  Vital signs stable.  LUNGS:  Clear to auscultation bilaterally.  HEART:  Regular rate and rhythm.  ABDOMEN:  Gravid and nontender.  CERVIX:  Long, closed, vertex at minus 3 station.  The vaginal vault was  dry, firm, and negative, and Nitrazine negative.   ADMITTING LABORATORY DATA:  Hemoglobin 12, hematocrit 35, white blood  cell count 8500, platelets 265,000.  Comprehensive metabolic panel was  within normal limits.   HOSPITAL COURSE:  The patient was admitted and induction of labor was  started, cervical ripening, followed by Pitocin.  The patient made no  significant progress with induction of labor, but on the morning after  induction was started she complained of itching and a lesion in the same  area that she generally has her herpes outbreaks.  An erosion was  observed in the vulvar, and a decision was made to proceed with cesarean  section delivery for active herpes outbreak.  Primary low transverse  cesarean section was performed on November 11, 2008.  There were no  intraoperative complications.  Postoperative course was uncomplicated.  The patient was discharged home on postop day 3 in good condition.   DISCHARGE LABORATORY DATA:  Hemoglobin 11, hematocrit  32, white blood  cell count 9400, platelets 240,000.   DISCHARGE DISPOSITION:  Medications, Percocet, and ibuprofen was  prescribed for pain.  Routine written instructions were given for  discharge after cesarean section.  The patient is to call the office for  a followup appointment in 2 weeks.      Charles A. Clearance Coots, M.D.  Electronically Signed     CAH/MEDQ  D:  11/14/2008  T:  11/14/2008  Job:  [id number]

## 2010-09-30 NOTE — Op Note (Signed)
NAMEJYLLIAN, Carmen [name]              ACCOUNT NO.:  [id number]   MEDICAL RECORD NO.:  [id number]          PATIENT TYPE:  INP   LOCATION:  9123                          FACILITY:  [facility]   PHYSICIAN:  Kathreen Cosier, M.D.DATE OF BIRTH:  1981-01-12   DATE OF PROCEDURE:  DATE OF DISCHARGE:                               OPERATIVE REPORT   PREOPERATIVE DIAGNOSIS:  Intrauterine pregnancy at 38 weeks with  oligohydramnios and herpes outbreak and failed induction.   POSTOPERATIVE DIAGNOSIS:  Intrauterine pregnancy at 38 weeks with  oligohydramnios and herpes outbreak and failed induction.   ANESTHESIA:  Spinal.   PROCEDURE:  The patient placed on the operating table in supine  position.  Abdomen prepped and draped.  Bladder emptied with a Foley  catheter.  Transverse suprapubic incision made, carried out through the  rectus fascia.  Fascia cleaned and incised length of the incision.  Recti muscles retracted laterally.  Peritoneum incised longitudinally.  Transverse incision made at the visceral peritoneum above the bladder.  Bladder mobilized inferiorly.  Transverse lower uterine incision made  and the fluid was meconium stained.  She was delivered from the OP  position of a female.  Apgar 8 and 9, weighing 6 pounds 13 ounces.  There  was thin meconium and the cord was prolapsed beside the head.  Apgars  were 8 and 9.  The placenta was posterior, fundal removed manually and  sent to Labor and Delivery.  Uterine cavity cleaned with dry laps.  Uterine incision closed in one layer with continuous suture #1 chromic.  Hemostasis satisfactory.  Bladder flap reattached with 2-0 chromic.  Uterus well contracted.  Tubes and ovaries normal.  Abdomen closed in  layers, peritoneum continuous suture of 0 chromic, fascia continuous  suture of 0 Dexon and the skin closed with subcuticular stitch of 4-0  Monocryl.  Blood loss 700 mL.  The patient tolerated the procedure well,  taken to recovery room in good  condition.           ______________________________  Kathreen Cosier, M.D.     BAM/MEDQ  D:  11/11/2008  T:  11/11/2008  Job:  [id number]

## 2010-09-30 NOTE — H&P (Signed)
Carmen [name], Carmen [name]              ACCOUNT NO.:  [id number]   MEDICAL RECORD NO.:  [id number]          PATIENT TYPE:  INP   LOCATION:  9123                          FACILITY:  [facility]   PHYSICIAN:  Kathreen Cosier, M.D.DATE OF BIRTH:  02-Dec-1980   DATE OF ADMISSION:  11/08/2008  DATE OF DISCHARGE:                              HISTORY & PHYSICAL   The patient is a 30 year old gravida 3, para 0-0-2-0, [facility] November 21, 2008.  She was brought in for induction because of oligohydramnios and she  received Cervidil and Cytotec multiple times.  The patient had a  negative GBS.  She also had a history of herpes and states that her last  outbreak was in May and that she was on Valtrex; however, since she was  admitted to the hospital, she was not on a Valtrex except it was  restarted on November 10, 2008, in the p.m.  In the p.m. of November 10, 2008,  the patient states that she was having irritation in the region where  she normally has a herpes outbreak and it was noted that she had what  appeared to be a lesion at the fourchette and it was decided she would  be delivered by C-section for history of herpes with frequent outbreaks  and possible outbreak.   PHYSICAL EXAMINATION:  GENERAL:  Well-developed female in no distress.  HEENT:  Negative.  LUNGS:  Clear.  HEART:  Regular rhythm.  No murmurs, no gallops.  BREASTS:  No masses.  ABDOMEN:  Term size uterus.  Cervix was long, closed.  An exam on November 11, 2008, revealed what appeared to be a herpetic lesion at 6 o'clock on  the fourchette.  EXTREMITIES:  Negative.           ______________________________  Kathreen Cosier, M.D.     BAM/MEDQ  D:  11/11/2008  T:  11/11/2008  Job:  [id number]

## 2011-02-09 LAB — CBC
HCT: 30.5 — ABNORMAL LOW
HCT: 31 — ABNORMAL LOW
MCHC: 34.1
MCHC: 34.1
MCHC: 34.5
MCV: 81.8
MCV: 81.9
Platelets: 303
Platelets: 370
Platelets: 378
RDW: 13.6
RDW: 13.6
RDW: 14
WBC: 9.1

## 2011-02-09 LAB — BASIC METABOLIC PANEL
BUN: 6
CO2: 24
Chloride: 108
GFR calc non Af Amer: >60
Glucose, Bld: 130 — ABNORMAL HIGH
Potassium: 4

## 2011-02-09 LAB — TYPE AND SCREEN

## 2012-07-29 ENCOUNTER — Encounter: Payer: Self-pay | Admitting: Obstetrics

## 2012-08-09 ENCOUNTER — Ambulatory Visit: Payer: Self-pay | Admitting: Obstetrics

## 2012-09-06 ENCOUNTER — Encounter: Payer: Self-pay | Admitting: Obstetrics

## 2012-09-06 ENCOUNTER — Ambulatory Visit (INDEPENDENT_AMBULATORY_CARE_PROVIDER_SITE_OTHER): Payer: BC Managed Care – PPO | Admitting: Obstetrics

## 2012-09-06 VITALS — BP 113/78 | HR 80 | Temp 98.3°F | Ht 66.0 in | Wt 256.4 lb

## 2012-09-06 DIAGNOSIS — N946 Dysmenorrhea, unspecified: Secondary | ICD-10-CM | POA: Insufficient documentation

## 2012-09-06 DIAGNOSIS — A6000 Herpesviral infection of urogenital system, unspecified: Secondary | ICD-10-CM | POA: Insufficient documentation

## 2012-09-06 DIAGNOSIS — D259 Leiomyoma of uterus, unspecified: Secondary | ICD-10-CM | POA: Insufficient documentation

## 2012-09-06 DIAGNOSIS — Z3009 Encounter for other general counseling and advice on contraception: Secondary | ICD-10-CM

## 2012-09-06 DIAGNOSIS — N920 Excessive and frequent menstruation with regular cycle: Secondary | ICD-10-CM

## 2012-09-06 MED ORDER — IBUPROFEN 800 MG PO TABS: 800.0000 mg | ORAL_TABLET | Freq: Three times a day (TID) | ORAL | Status: DC | PRN

## 2012-09-06 MED ORDER — VALACYCLOVIR HCL 1 G PO TABS: 1000.0000 mg | ORAL_TABLET | Freq: Two times a day (BID) | ORAL | Status: DC

## 2012-09-06 NOTE — Patient Instructions (Signed)
Dysmenorrhea Management. Management of Genital Herpes.

## 2012-09-06 NOTE — Progress Notes (Signed)
  Subjective:     Carmen [name] is a 32 y.o. woman who presents for irregular menses. Patient's last menstrual period was 08/23/2012. Menarche age 47. Periods are irregular, lasting 6-8 days. Dysmenorrhea:moderate, occurring second and third day of cycle. Cyclic symptoms include: headache and fatigue. Current contraception: none.History of infertility: no. History of abnormal Pap smear: yes -with no prcoedures performed   Review of Systems Pertinent items are noted in HPI.    Objective:   No exam performed today, Consult only..  Assessment:   The patient has menorrhagia and dysmenorrhea.  H/O uterine fibroids. Genital Herpes with frequent outbreaks.   Plan:   All questions answered. Agricultural engineer distributed. Contraception: none. Follow up in 3 months. Pelvic ultrasound. OCP's dispensed ( Minastrin 24 ) Ibuprofen Rx with instructions. Valtrex Rx and suppressive therapy started.

## 2012-12-27 ENCOUNTER — Encounter: Payer: Self-pay | Admitting: Obstetrics

## 2012-12-27 ENCOUNTER — Ambulatory Visit (INDEPENDENT_AMBULATORY_CARE_PROVIDER_SITE_OTHER): Payer: BC Managed Care – PPO | Admitting: Obstetrics

## 2012-12-27 VITALS — BP 125/83 | HR 76 | Temp 97.7°F | Wt 262.8 lb

## 2012-12-27 DIAGNOSIS — D259 Leiomyoma of uterus, unspecified: Secondary | ICD-10-CM

## 2012-12-27 DIAGNOSIS — Z3009 Encounter for other general counseling and advice on contraception: Secondary | ICD-10-CM

## 2012-12-27 DIAGNOSIS — E669 Obesity, unspecified: Secondary | ICD-10-CM

## 2012-12-27 LAB — COMPREHENSIVE METABOLIC PANEL
AST: 12 U/L (ref 0–37)
Albumin: 3.7 g/dL (ref 3.5–5.2)
Alkaline Phosphatase: 65 U/L (ref 39–117)
Potassium: 4.1 mEq/L (ref 3.5–5.3)
Sodium: 138 mEq/L (ref 135–145)
Total Bilirubin: 0.2 mg/dL — ABNORMAL LOW (ref 0.3–1.2)
Total Protein: 7.1 g/dL (ref 6.0–8.3)

## 2012-12-27 LAB — CBC WITH DIFFERENTIAL/PLATELET
Basophils Absolute: 0.1 10*3/uL (ref 0.0–0.1)
Basophils Relative: 1 % (ref 0–1)
Eosinophils Absolute: 0.5 10*3/uL (ref 0.0–0.7)
Hemoglobin: 9.9 g/dL — ABNORMAL LOW (ref 12.0–15.0)
MCHC: 30.1 g/dL (ref 30.0–36.0)
Neutro Abs: 3.7 10*3/uL (ref 1.7–7.7)
Neutrophils Relative %: 50 % (ref 43–77)
Platelets: 365 10*3/uL (ref 150–400)
RDW: 15.7 % — ABNORMAL HIGH (ref 11.5–15.5)

## 2012-12-27 MED ORDER — LEVONORGESTREL-ETHINYL ESTRAD 0.15-30 MG-MCG PO TABS
1.0000 | ORAL_TABLET | Freq: Every day | ORAL | Status: DC
Start: 2012-12-27 — End: 2013-05-16

## 2012-12-27 NOTE — Progress Notes (Signed)
Subjective:    Carmen [name] is a 32 y.o. female who presents for evaluation of menstrual symptoms. Patient describes symptoms of bloating/fluid retention (moderate), breast tenderness (mild), decreased libido (mild), insomnia (moderate), menorrhagia (severe), menstrual cramping (moderate), migraine headaches (moderate) and pelvic pain (moderate). Symptoms occur 3 days prior to onset of menses. Patient denies anxiety and depression. Evaluation to date includes: OCP management. Treatment to date includes: OTC NSAIDs (somewhat effective) and Oral contraceptive pills per medication list:(made things worse). The patient is not currently sexually active.   Menstrual History: OB History   Grav Para Term Preterm Abortions TAB SAB Ect Mult Living   5 2 2  0 3 0 3 0 0 0      Menarche age: 73  No LMP recorded.    The following portions of the patient's history were reviewed and updated as appropriate: allergies, current medications, past family history, past medical history, past social history, past surgical history and problem list.  Review of Systems Pertinent items are noted in HPI.   Objective:    There were no vitals taken for this visit. No exam performed today, Consult only..   Assessment:    PMS: moderate    Plan:    Discussed the diagnosis with the patient. Agricultural engineer distributed. Discussed non-pharmaceutical approaches to the problem. I changed oral contraceptive pills per orders. Medication changes per orders. Follow up in 4 months or as needed.

## 2012-12-28 LAB — HEMOGLOBIN A1C
Hgb A1c MFr Bld: 5.9 % — ABNORMAL HIGH (ref <5.7)
Mean Plasma Glucose: 123 mg/dL — ABNORMAL HIGH (ref <117)

## 2013-01-03 ENCOUNTER — Ambulatory Visit: Payer: BC Managed Care – PPO | Admitting: Obstetrics

## 2013-01-10 ENCOUNTER — Ambulatory Visit: Payer: BC Managed Care – PPO | Admitting: Obstetrics

## 2013-02-26 ENCOUNTER — Other Ambulatory Visit: Payer: Self-pay | Admitting: Obstetrics

## 2013-03-09 ENCOUNTER — Encounter: Payer: Self-pay | Admitting: Obstetrics

## 2013-03-09 ENCOUNTER — Ambulatory Visit (INDEPENDENT_AMBULATORY_CARE_PROVIDER_SITE_OTHER): Payer: BC Managed Care – PPO | Admitting: Obstetrics

## 2013-03-09 VITALS — BP 126/77 | HR 81 | Temp 98.1°F | Ht 66.0 in | Wt 260.0 lb

## 2013-03-09 DIAGNOSIS — N946 Dysmenorrhea, unspecified: Secondary | ICD-10-CM

## 2013-03-09 DIAGNOSIS — E669 Obesity, unspecified: Secondary | ICD-10-CM

## 2013-03-09 DIAGNOSIS — N920 Excessive and frequent menstruation with regular cycle: Secondary | ICD-10-CM

## 2013-03-09 DIAGNOSIS — Z01419 Encounter for gynecological examination (general) (routine) without abnormal findings: Secondary | ICD-10-CM

## 2013-03-09 DIAGNOSIS — N939 Abnormal uterine and vaginal bleeding, unspecified: Secondary | ICD-10-CM

## 2013-03-09 DIAGNOSIS — N926 Irregular menstruation, unspecified: Secondary | ICD-10-CM

## 2013-03-09 LAB — CBC WITH DIFFERENTIAL/PLATELET
Basophils Absolute: 0 10*3/uL (ref 0.0–0.1)
Eosinophils Relative: 7 % — ABNORMAL HIGH (ref 0–5)
HCT: 31.6 % — ABNORMAL LOW (ref 36.0–46.0)
Lymphocytes Relative: 32 % (ref 12–46)
Lymphs Abs: 1.8 10*3/uL (ref 0.7–4.0)
MCV: 75.6 fL — ABNORMAL LOW (ref 78.0–100.0)
Monocytes Absolute: 0.5 10*3/uL (ref 0.1–1.0)
RDW: 16.3 % — ABNORMAL HIGH (ref 11.5–15.5)
WBC: 5.8 10*3/uL (ref 4.0–10.5)

## 2013-03-09 LAB — TSH: TSH: 0.815 u[IU]/mL (ref 0.350–4.500)

## 2013-03-09 LAB — COMPREHENSIVE METABOLIC PANEL
BUN: 8 mg/dL (ref 6–23)
CO2: 25 mEq/L (ref 19–32)
Calcium: 9.1 mg/dL (ref 8.4–10.5)
Chloride: 105 mEq/L (ref 96–112)
Creat: 0.69 mg/dL (ref 0.50–1.10)

## 2013-03-09 MED ORDER — IBUPROFEN 800 MG PO TABS: 800.0000 mg | ORAL_TABLET | Freq: Three times a day (TID) | ORAL | Status: DC | PRN

## 2013-03-09 MED ORDER — PHENTERMINE HCL 37.5 MG PO CAPS: 37.5000 mg | ORAL_CAPSULE | ORAL | Status: DC

## 2013-03-09 NOTE — Progress Notes (Signed)
Subjective:     Carmen [name] is a 32 y.o. female here for a routine exam.  Current complaints: menstrual problem. Pt states she is having prolonged periods. Pt states she is currently on day 11 of her cycle. Pt states she is currently on Nordette for birth control. Pt states she missed 1 pills earlier this week. Pt states she is having cramping associated with her cycle. Pt states Ibuprofen helps with the pain. Pt states she is extremely fatigued. Pt states she is not on an iron supplement at this time. Pt states she can get 8 hours of sleep and still feeling fatigued. Personal health questionnaire reviewed: yes.   Gynecologic History Patient's last menstrual period was 02/26/2013. Contraception: OCP (estrogen/progesterone) Last Pap: 2013. Results were: normal Last mammogram: n/a. Results were: n/a  Obstetric History OB History  Gravida Para Term Preterm AB SAB TAB Ectopic Multiple Living  5 2 2  0 3 3 0 0 0 0    # Outcome Date GA Lbr Len/2nd Weight Sex Delivery Anes PTL Lv  5 TRM 03/03/10          4 TRM 11/11/08 [redacted]w[redacted]d 72:00 6 lb 3 oz (2.807 kg)       3 SAB 02/13/08          2 SAB 07/28/07          1 SAB 04/17/98               The following portions of the patient's history were reviewed and updated as appropriate: allergies, current medications, past family history, past medical history, past social history, past surgical history and problem list.  Review of Systems Pertinent items are noted in HPI.    Objective:    General appearance: alert and no distress Breasts: normal appearance, no masses or tenderness Abdomen: normal findings: soft, non-tender Pelvic: cervix normal in appearance, external genitalia normal, no adnexal masses or tenderness, no cervical motion tenderness, uterus normal size, shape, and consistency and vagina normal without discharge    Assessment:    Healthy female exam.   Obesity  Dysmenorrhea  Menorrhagia   Plan:    Education reviewed: low fat,  low cholesterol diet, self breast exams, weight bearing exercise and weight loss. Contraception: OCP (estrogen/progesterone). Follow up in: 1 year. Phentermine Rx

## 2013-03-09 NOTE — Addendum Note (Signed)
Addended by: George Hugh on: 03/09/2013 01:29 PM   Modules accepted: Orders

## 2013-03-10 LAB — WET PREP BY MOLECULAR PROBE
Candida species: NEGATIVE
Gardnerella vaginalis: POSITIVE — AB
Trichomonas vaginosis: NEGATIVE

## 2013-03-15 ENCOUNTER — Encounter: Payer: Self-pay | Admitting: Obstetrics

## 2013-03-15 ENCOUNTER — Ambulatory Visit (INDEPENDENT_AMBULATORY_CARE_PROVIDER_SITE_OTHER): Payer: BC Managed Care – PPO

## 2013-03-15 ENCOUNTER — Other Ambulatory Visit: Payer: Self-pay | Admitting: Obstetrics

## 2013-03-15 ENCOUNTER — Other Ambulatory Visit: Payer: Self-pay | Admitting: *Deleted

## 2013-03-15 DIAGNOSIS — N949 Unspecified condition associated with female genital organs and menstrual cycle: Secondary | ICD-10-CM

## 2013-03-15 DIAGNOSIS — B9689 Other specified bacterial agents as the cause of diseases classified elsewhere: Secondary | ICD-10-CM

## 2013-03-15 DIAGNOSIS — N920 Excessive and frequent menstruation with regular cycle: Secondary | ICD-10-CM

## 2013-03-15 DIAGNOSIS — Z0000 Encounter for general adult medical examination without abnormal findings: Secondary | ICD-10-CM

## 2013-03-15 MED ORDER — METRONIDAZOLE 500 MG PO TABS: 500.0000 mg | ORAL_TABLET | Freq: Two times a day (BID) | ORAL | Status: DC

## 2013-03-15 MED ORDER — PNV PRENATAL PLUS MULTIVITAMIN 27-1 MG PO TABS: 1.0000 | ORAL_TABLET | Freq: Every day | ORAL | Status: DC

## 2013-03-20 ENCOUNTER — Encounter: Payer: Self-pay | Admitting: Obstetrics

## 2013-03-21 ENCOUNTER — Encounter (HOSPITAL_COMMUNITY): Payer: Self-pay | Admitting: *Deleted

## 2013-03-21 ENCOUNTER — Inpatient Hospital Stay (HOSPITAL_COMMUNITY)
Admission: AD | Admit: 2013-03-21 | Discharge: 2013-03-21 | Disposition: A | Discharge status: Home / Self Care | Payer: BC Managed Care – PPO | Source: Ambulatory Visit | Attending: Obstetrics | Admitting: Obstetrics

## 2013-03-21 ENCOUNTER — Ambulatory Visit: Payer: BC Managed Care – PPO | Admitting: Obstetrics

## 2013-03-21 DIAGNOSIS — N921 Excessive and frequent menstruation with irregular cycle: Secondary | ICD-10-CM

## 2013-03-21 DIAGNOSIS — N920 Excessive and frequent menstruation with regular cycle: Secondary | ICD-10-CM | POA: Insufficient documentation

## 2013-03-21 DIAGNOSIS — D500 Iron deficiency anemia secondary to blood loss (chronic): Secondary | ICD-10-CM | POA: Insufficient documentation

## 2013-03-21 DIAGNOSIS — N946 Dysmenorrhea, unspecified: Secondary | ICD-10-CM

## 2013-03-21 HISTORY — DX: Anemia, unspecified: D64.9

## 2013-03-21 LAB — CBC
MCH: 23.7 pg — ABNORMAL LOW (ref 26.0–34.0)
MCV: 75.5 fL — ABNORMAL LOW (ref 78.0–100.0)
Platelets: 422 10*3/uL — ABNORMAL HIGH (ref 150–400)
RDW: 15.2 % (ref 11.5–15.5)

## 2013-03-21 MED ORDER — OXYCODONE-ACETAMINOPHEN 5-325 MG PO TABS
2.0000 | ORAL_TABLET | ORAL | Status: AC
  Administered 2013-03-21: 2 via ORAL
  Filled 2013-03-21: qty 2

## 2013-03-21 MED ORDER — MEDROXYPROGESTERONE ACETATE 10 MG PO TABS: 10.0000 mg | ORAL_TABLET | Freq: Every day | ORAL | Status: DC

## 2013-03-21 NOTE — MAU Note (Signed)
Has been bleeding for 25 days, is heavy @ present.  Taking birth control pills prescribed by [facility].  Dx'd with anemia, taking iron.

## 2013-03-21 NOTE — MAU Note (Signed)
Changed BC pills in September; had episodes of bleeding for 125 days on old rx of BC; no period in September but had 15 days of bleeding in Aug; has been bleeding for past 25 days now;

## 2013-03-21 NOTE — MAU Note (Signed)
C/o vaginal bleeding for past 25 days with cramping; is on BC pills;

## 2013-03-21 NOTE — MAU Provider Note (Signed)
History     CSN: [id number]  Arrival date and time: 03/21/13 1216   First Provider Initiated Contact with Patient 03/21/13 1329      Chief Complaint  Patient presents with  . Vaginal Bleeding   HPI Comments: Carmen [name] 32 y.o. [id number] presents with complaints of heavy vaginal bleeding.  She is followed by Dr. Clearance Coots for GYN care.  Following the birth of her last child, her menstrual cycles started to become irregular in timing and flow.  In July the patient was started on OCP's in an attempt to regulate menses.  She did not have a menses that month.  In August 2014, she had a menses lasting 15 days.  In Sept 2014, she did not have a menses.  She began bleeding Feb 26, 2013.  The flow was initially that of a normal menses, which progressed to heavier flow with clots present.  On October 23, she was seen and evaluated by Dr. Clearance Coots for continued bleeding.  She was found to be "slightly anemic" and was started on iron supplementation and a prenatal vitamin.  She was instructed to follow up in 3-4 months if her symptoms continued.  On 03/11/2013, the bleeding slowed to that of a light flow.  On 03/19/2013, her flow returned to heavy "gushes" with clots present.  She reports being tired and constantly feeling exhausted.  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  She denies appetite changes, fever, chills, dark tarry stools.    Vaginal Bleeding    Past Medical History  Diagnosis Date  . Irregular periods/menstrual cycles 10/04/2009  . History of dysmenorrhea   . Fibroids     Uterine unspecified  . Herpes   . Hypertension   . Obesity   . History of migraine headaches     Seen By Dr. Cleotilde Neer  . Heart murmur   . Anemia   . GNFAOZHY(865.7)     Past Surgical History  Procedure Laterality Date  . Cesarean section       x 2  . Wisdom tooth extraction      Family History  Problem  Relation Age of Onset  . Diabetes    . Heart disease      maternal grandparents  . Hypercholesterolemia    . Gout    . Congestive Heart Failure    . Breast cancer Mother     maternal aunt  . Alzheimer's disease    . Stroke Maternal Grandmother   . Kidney disease Maternal Grandmother   . Heart attack    . Hypertension Brother   . Diabetes Paternal Grandmother   . Ovarian cancer      Aunt  . Breast cancer Maternal Aunt     History  Substance Use Topics  . Smoking status: Never Smoker   . Smokeless tobacco: Never Used  . Alcohol Use: No    Allergies: No Known Allergies  No prescriptions prior to admission    Review of Systems  Constitutional:       Tired  HENT: Negative.   Eyes: Negative.   Respiratory: Negative.   Cardiovascular: Negative.   Gastrointestinal: Negative.   Genitourinary: Negative.   Musculoskeletal: Negative.   Skin: Negative.   Neurological: Positive for dizziness and weakness.       Lightheadedness  Endo/Heme/Allergies: Negative.   Psychiatric/Behavioral: Negative.    Physical Exam   Blood pressure 136/75,  pulse 78, temperature 98.9 F (37.2 C), temperature source Oral, resp. rate 18, height 5\' 6"  (1.676 m), weight 117.3 kg (258 lb 9.6 oz), last menstrual period 02/26/2013.  Physical Exam  Constitutional: She is oriented to person, place, and time. She appears well-developed and well-nourished.  Tired appearing  HENT:  Head: Normocephalic and atraumatic.  Eyes: Pupils are equal, round, and reactive to light.  Neck: Normal range of motion.  Cardiovascular: Normal rate, regular rhythm and normal heart sounds.   Respiratory: Effort normal and breath sounds normal.  GI: Soft. Bowel sounds are normal.  Genitourinary:  Deferred- pt had pelvic exam with STI testing on 03/09/13 with Dr. Clearance Coots  Musculoskeletal: Normal range of motion.  Neurological: She is alert and oriented to person, place, and time.  Skin: Skin is warm and dry.   Psychiatric: She has a normal mood and affect.   Results for orders placed during the hospital encounter of 03/21/13 (from the past 24 hour(s))  CBC     Status: Abnormal   Collection Time    03/21/13  2:10 PM      Result Value Range   WBC 6.4  4.0 - 10.5 K/uL   RBC 4.17  3.87 - 5.11 MIL/uL   Hemoglobin 9.9 (*) 12.0 - 15.0 g/dL   HCT 62.1 (*) 30.8 - 65.7 %   MCV 75.5 (*) 78.0 - 100.0 fL   MCH 23.7 (*) 26.0 - 34.0 pg   MCHC 31.4  30.0 - 36.0 g/dL   RDW 84.6  96.2 - 95.2 %   Platelets 422 (*) 150 - 400 K/uL   MAU Course  Procedures  Pelvic examination deferred. Pt had examination on 03/09/13 with Dr. Clearance Coots.  Assessment and Plan   1. Iron deficiency anemia due to chronic blood loss   2. Dysmenorrhea   3. Menometrorrhagia     Plan: Povera daily x 10 days. Continue Ibuoprofen 800 mg TID. Continue PO iron as prescribed. Follow up with Dr. Clearance Coots in 2 weeks following 10 days of Provera.  Discussed other options for menorrhagia treatment with patient (Mirena).   LEFTWICH-KIRBY, Vearl Aitken 03/21/2013, 4:15 PM

## 2013-03-23 ENCOUNTER — Ambulatory Visit: Payer: BC Managed Care – PPO | Admitting: Obstetrics

## 2013-03-28 ENCOUNTER — Ambulatory Visit: Payer: BC Managed Care – PPO | Admitting: Obstetrics

## 2013-05-03 ENCOUNTER — Ambulatory Visit (INDEPENDENT_AMBULATORY_CARE_PROVIDER_SITE_OTHER): Payer: BC Managed Care – PPO | Admitting: Obstetrics

## 2013-05-03 ENCOUNTER — Encounter: Payer: Self-pay | Admitting: Obstetrics

## 2013-05-03 VITALS — BP 142/90 | HR 95 | Temp 102.5°F | Ht 66.0 in | Wt 256.0 lb

## 2013-05-03 DIAGNOSIS — Z309 Encounter for contraceptive management, unspecified: Secondary | ICD-10-CM

## 2013-05-03 DIAGNOSIS — Z30430 Encounter for insertion of intrauterine contraceptive device: Secondary | ICD-10-CM

## 2013-05-03 NOTE — Progress Notes (Signed)
Patient had temp of 102 and had unprotected sex 1 week ago.  Mirena insertion cancelled.

## 2013-05-16 ENCOUNTER — Ambulatory Visit: Payer: BC Managed Care – PPO | Admitting: Obstetrics

## 2013-05-16 ENCOUNTER — Encounter: Payer: Self-pay | Admitting: Obstetrics

## 2013-05-16 ENCOUNTER — Ambulatory Visit (INDEPENDENT_AMBULATORY_CARE_PROVIDER_SITE_OTHER): Payer: BC Managed Care – PPO | Admitting: Obstetrics

## 2013-05-16 VITALS — BP 145/90 | HR 78 | Temp 99.1°F | Ht 66.0 in | Wt 257.0 lb

## 2013-05-16 DIAGNOSIS — IMO0001 Reserved for inherently not codable concepts without codable children: Secondary | ICD-10-CM

## 2013-05-16 DIAGNOSIS — Z113 Encounter for screening for infections with a predominantly sexual mode of transmission: Secondary | ICD-10-CM

## 2013-05-16 DIAGNOSIS — Z30430 Encounter for insertion of intrauterine contraceptive device: Secondary | ICD-10-CM

## 2013-05-16 DIAGNOSIS — Z3202 Encounter for pregnancy test, result negative: Secondary | ICD-10-CM

## 2013-05-16 NOTE — Progress Notes (Signed)
IUD Insertion Procedure Note  Pre-operative Diagnosis: Desires Contraception  Post-operative Diagnosis: same  Indications: contraception  Procedure Details  Urine pregnancy test was done in office and result was negative.  The risks (including infection, bleeding, pain, and uterine perforation) and benefits of the procedure were explained to the patient and Written informed consent was obtained.    Cervix cleansed with Betadine. Uterus sounded to 8 cm. IUD inserted without difficulty. String visible and trimmed. Patient tolerated procedure well.  IUD Information: Mirena, Lot # [id number], Expiration date 04 / 17.  Condition: Stable  Complications: None  Plan:  The patient was advised to call for any fever or for prolonged or severe pain or bleeding. She was advised to use NSAID as needed for mild to moderate pain.   Attending Physician Documentation: I was present for or participated in the entire procedure, including opening and closing.

## 2013-05-17 ENCOUNTER — Encounter: Payer: Self-pay | Admitting: Obstetrics

## 2013-05-17 NOTE — Addendum Note (Signed)
Addended by: Henriette Combs on: 05/17/2013 10:53 AM   Modules accepted: Orders

## 2013-05-18 LAB — WET PREP BY MOLECULAR PROBE
Candida species: NEGATIVE
Gardnerella vaginalis: NEGATIVE
Trichomonas vaginosis: NEGATIVE

## 2013-05-18 LAB — GC/CHLAMYDIA PROBE AMP
CT Probe RNA: NEGATIVE
GC Probe RNA: NEGATIVE

## 2013-06-15 ENCOUNTER — Ambulatory Visit: Payer: BC Managed Care – PPO | Admitting: Obstetrics

## 2013-06-27 ENCOUNTER — Encounter: Payer: Self-pay | Admitting: Obstetrics

## 2013-06-27 ENCOUNTER — Ambulatory Visit (INDEPENDENT_AMBULATORY_CARE_PROVIDER_SITE_OTHER): Payer: BC Managed Care – PPO | Admitting: Obstetrics

## 2013-06-27 VITALS — BP 137/88 | HR 76 | Temp 98.6°F | Ht 66.0 in | Wt 262.0 lb

## 2013-06-27 DIAGNOSIS — D649 Anemia, unspecified: Secondary | ICD-10-CM | POA: Insufficient documentation

## 2013-06-27 DIAGNOSIS — Z113 Encounter for screening for infections with a predominantly sexual mode of transmission: Secondary | ICD-10-CM

## 2013-06-27 DIAGNOSIS — N760 Acute vaginitis: Secondary | ICD-10-CM | POA: Insufficient documentation

## 2013-06-27 LAB — CBC
HCT: 33.3 % — ABNORMAL LOW (ref 36.0–46.0)
Hemoglobin: 10.8 g/dL — ABNORMAL LOW (ref 12.0–15.0)
MCH: 24.2 pg — ABNORMAL LOW (ref 26.0–34.0)
MCHC: 32.4 g/dL (ref 30.0–36.0)
MCV: 74.5 fL — AB (ref 78.0–100.0)
Platelets: 412 10*3/uL — ABNORMAL HIGH (ref 150–400)
RBC: 4.47 MIL/uL (ref 3.87–5.11)
RDW: 16.4 % — AB (ref 11.5–15.5)
WBC: 7.1 10*3/uL (ref 4.0–10.5)

## 2013-06-27 NOTE — Progress Notes (Signed)
Subjective:     Carmen [name] is a 33 y.o. female here for a routine exam.  Current complaints: Patient in office today for follow up for Mirena. Patient states she would like to have STD testing today. Patient denies any concerns. Patient denies any exposure states she just gets checked every year. Patient notified that STD testing was preformed on May 17, 2013 and that results were negative but that she could still have it done if she wanted, Patient then denied testing. Personal health questionnaire reviewed: yes.   Gynecologic History Patient's last menstrual period was 06/12/2013. Contraception: IUD Last Pap: 03/2013. Results were: normal   Obstetric History OB History  Gravida Para Term Preterm AB SAB TAB Ectopic Multiple Living  5 2 2  0 3 3 0 0 0 2    # Outcome Date GA Lbr Len/2nd Weight Sex Delivery Anes PTL Lv  5 TRM 03/03/10          4 TRM 11/11/08 [redacted]w[redacted]d 72:00 6 lb 3 oz (2.807 kg)       3 SAB 02/13/08          2 SAB 07/28/07          1 SAB 04/17/98               The following portions of the patient's history were reviewed and updated as appropriate: allergies, current medications, past family history, past medical history, past social history, past surgical history and problem list.  Review of Systems Pertinent items are noted in HPI.    Objective:    General appearance: alert and no distress Abdomen: normal findings: soft, non-tender Pelvic: cervix normal in appearance, external genitalia normal, no adnexal masses or tenderness, no cervical motion tenderness, rectovaginal septum normal, uterus normal size, shape, and consistency, vagina normal without discharge and IUD string visible, normal length.    Assessment:    Healthy female exam.    Plan:    Contraception: IUD. Follow up in: several months.

## 2013-06-28 ENCOUNTER — Other Ambulatory Visit: Payer: Self-pay | Admitting: *Deleted

## 2013-06-28 DIAGNOSIS — B9689 Other specified bacterial agents as the cause of diseases classified elsewhere: Secondary | ICD-10-CM

## 2013-06-28 DIAGNOSIS — N760 Acute vaginitis: Principal | ICD-10-CM

## 2013-06-28 LAB — HEPATITIS C ANTIBODY: HCV AB: NEGATIVE

## 2013-06-28 LAB — WET PREP BY MOLECULAR PROBE
CANDIDA SPECIES: NEGATIVE
GARDNERELLA VAGINALIS: POSITIVE — AB
Trichomonas vaginosis: NEGATIVE

## 2013-06-28 LAB — HIV ANTIBODY (ROUTINE TESTING W REFLEX): HIV: NONREACTIVE

## 2013-06-28 LAB — GC/CHLAMYDIA PROBE AMP
CT Probe RNA: NEGATIVE
GC Probe RNA: NEGATIVE

## 2013-06-28 LAB — RPR

## 2013-06-28 LAB — HEPATITIS B SURFACE ANTIGEN: Hepatitis B Surface Ag: NEGATIVE

## 2013-06-28 MED ORDER — METRONIDAZOLE 500 MG PO TABS: 500.0000 mg | ORAL_TABLET | Freq: Two times a day (BID) | ORAL | Status: DC

## 2013-07-12 ENCOUNTER — Encounter (HOSPITAL_COMMUNITY): Payer: Self-pay | Admitting: *Deleted

## 2013-07-12 ENCOUNTER — Inpatient Hospital Stay (HOSPITAL_COMMUNITY)
Admission: AD | Admit: 2013-07-12 | Discharge: 2013-07-12 | Disposition: A | Discharge status: Home / Self Care | Payer: BC Managed Care – PPO | Source: Ambulatory Visit | Attending: Obstetrics & Gynecology | Admitting: Obstetrics & Gynecology

## 2013-07-12 DIAGNOSIS — N946 Dysmenorrhea, unspecified: Secondary | ICD-10-CM

## 2013-07-12 DIAGNOSIS — M7062 Trochanteric bursitis, left hip: Secondary | ICD-10-CM

## 2013-07-12 DIAGNOSIS — I10 Essential (primary) hypertension: Secondary | ICD-10-CM | POA: Insufficient documentation

## 2013-07-12 DIAGNOSIS — M76899 Other specified enthesopathies of unspecified lower limb, excluding foot: Secondary | ICD-10-CM | POA: Insufficient documentation

## 2013-07-12 DIAGNOSIS — D649 Anemia, unspecified: Secondary | ICD-10-CM | POA: Insufficient documentation

## 2013-07-12 LAB — URINALYSIS, ROUTINE W REFLEX MICROSCOPIC
Bilirubin Urine: NEGATIVE
Glucose, UA: NEGATIVE mg/dL
Ketones, ur: NEGATIVE mg/dL
Leukocytes, UA: NEGATIVE
Nitrite: NEGATIVE
PROTEIN: NEGATIVE mg/dL
Specific Gravity, Urine: 1.025 (ref 1.005–1.030)
UROBILINOGEN UA: 0.2 mg/dL (ref 0.0–1.0)
pH: 6 (ref 5.0–8.0)

## 2013-07-12 LAB — URINE MICROSCOPIC-ADD ON

## 2013-07-12 LAB — POCT PREGNANCY, URINE: Preg Test, Ur: NEGATIVE

## 2013-07-12 MED ORDER — IBUPROFEN 800 MG PO TABS: 800.0000 mg | ORAL_TABLET | Freq: Three times a day (TID) | ORAL | Status: DC | PRN

## 2013-07-12 NOTE — MAU Provider Note (Signed)
First Provider Initiated Contact with Patient 07/12/13 1914      Chief Complaint:  Hip Pain   Carmen [name] is  33 y.o. [id number].  Patient's last menstrual period was 06/25/2013.Marland Kitchen  Her pregnancy status is negative.  She presents complaining of Hip Pain  Pt rolled over yesterday  On couch and had severe hip pain that was sharp in nature. It is on the outside of her hip, worse with crossing her legs and with point pressure. Pt reports pain is improved with 800mg  motrin tabs. Pt is ambulating into appointment.   Pt denies Fevers, redness, swelling or inability to bare weight. Pain is improving from onset.   Pt also complains of irregular spotting after her cycle. Recently had IUD -mirena placed. Had period then had some minimal bleeding today and yesterday.    Past Medical History  Diagnosis Date  . Irregular periods/menstrual cycles 10/04/2009  . History of dysmenorrhea   . Fibroids     Uterine unspecified  . Herpes   . Hypertension   . Obesity   . History of migraine headaches     Seen By Dr. Venia Minks  . Heart murmur   . Anemia   . DPOEUMPN(361.4)     Past Surgical History  Procedure Laterality Date  . Cesarean section       x 2  . Wisdom tooth extraction      Family History  Problem Relation Age of Onset  . Diabetes    . Heart disease      maternal grandparents  . Hypercholesterolemia    . Gout    . Congestive Heart Failure    . Breast cancer Mother     maternal aunt  . Alzheimer's disease    . Stroke Maternal Grandmother   . Kidney disease Maternal Grandmother   . Heart attack    . Hypertension Brother   . Diabetes Paternal Grandmother   . Ovarian cancer      Aunt  . Breast cancer Maternal Aunt     History  Substance Use Topics  . Smoking status: Never Smoker   . Smokeless tobacco: Never Used  . Alcohol Use: No    Allergies: No Known Allergies  Prescriptions prior to admission  Medication Sig Dispense Refill  . amLODipine (NORVASC) 10  MG tablet Take 10 mg by mouth daily.      . Multiple Vitamin (MULTIVITAMIN) tablet Take 1 tablet by mouth daily.      . Prenatal Vit-Fe Fumarate-FA (PNV PRENATAL PLUS MULTIVITAMIN) 27-1 MG TABS Take 1 tablet by mouth daily before breakfast.  30 tablet  11  . [DISCONTINUED] ibuprofen (ADVIL,MOTRIN) 800 MG tablet Take 1 tablet (800 mg total) by mouth every 8 (eight) hours as needed for pain.  30 tablet  prn     Review of Systems  See HPIa  Physical Exam   Blood pressure 139/85, pulse 73, temperature 98.7 F (37.1 C), temperature source Oral, resp. rate 16, height 5\' 5"  (1.651 m), weight 119.477 kg (263 lb 6.4 oz), last menstrual period 06/25/2013, SpO2 100.00%.  General: General appearance - alert, well appearing, and in no distress, oriented to person, place, and time and normal appearing weight Extremities - Pt ambullated to room. Minimal analgesic gate Neg log roll, Point tenderness of left trochanteric bursa, pain with adduction. No pain with abduction of lip. Neg strait leg lift.  No redness or edema appreciated.  Labs: Results for orders placed during the hospital encounter of 07/12/13 (from the  past 24 hour(s))  URINALYSIS, ROUTINE W REFLEX MICROSCOPIC   Collection Time    07/12/13  6:00 PM      Result Value Ref Range   Color, Urine YELLOW  YELLOW   APPearance CLOUDY (*) CLEAR   Specific Gravity, Urine 1.025  1.005 - 1.030   pH 6.0  5.0 - 8.0   Glucose, UA NEGATIVE  NEGATIVE mg/dL   Hgb urine dipstick LARGE (*) NEGATIVE   Bilirubin Urine NEGATIVE  NEGATIVE   Ketones, ur NEGATIVE  NEGATIVE mg/dL   Protein, ur NEGATIVE  NEGATIVE mg/dL   Urobilinogen, UA 0.2  0.0 - 1.0 mg/dL   Nitrite NEGATIVE  NEGATIVE   Leukocytes, UA NEGATIVE  NEGATIVE  URINE MICROSCOPIC-ADD ON   Collection Time    07/12/13  6:00 PM      Result Value Ref Range   Squamous Epithelial / LPF FEW (*) RARE   WBC, UA 0-2  <3 WBC/hpf   RBC / HPF 7-10  <3 RBC/hpf   Bacteria, UA FEW (*) RARE   Urine-Other  MUCOUS PRESENT    POCT PREGNANCY, URINE   Collection Time    07/12/13  6:50 PM      Result Value Ref Range   Preg Test, Ur NEGATIVE  NEGATIVE   Imaging Studies:  No results found.   Assessment: Patient Active Problem List   Diagnosis Date Noted  . Anemia 06/27/2013  . Vaginitis and vulvovaginitis, unspecified 06/27/2013  . Obesity 03/09/2013  . Genital herpes, unspecified 09/06/2012  . Dysmenorrhea 09/06/2012  . Excessive or frequent menstruation 09/06/2012  . Leiomyoma of uterus, unspecified 09/06/2012    Carmen [name] is a 33 y.o. with acute trochanteric bursitis and improved symptoms with motrin. Will continue motrin 800mg  TID with food. - ice or heat ok. - stretches provided to patient - return precautions given for fever, redness swelling, edema or inability to bear weight. - pt stated understanding - recommended follow up with her PCM in 2 weeks if no improvement - elected not to image given low concern for arthiritis or septic joint given presentation. Carmen [name], Lyda Kalata

## 2013-07-12 NOTE — MAU Note (Signed)
Patient states she has had a Mirena IUD since December. States she started having left hip pain yesterday. Wants the IUD checked to make sure of placement. Thinks the pain may be related to the IUD. Has a little spotting.

## 2013-07-12 NOTE — Discharge Instructions (Signed)
Hip Bursitis °Bursitis is a puffiness (swelling) and soreness of a fluid-filled sac (bursa). This sac covers and protects the joint. °HOME CARE °· Put ice on the injured area. °· Put ice in a plastic bag. °· Place a towel between your skin and the bag. °· Leave the ice on for 15-20 minutes, 03-04 times a day. °· Rest the painful joint as much as possible. Move your joint at least 4 times a day. When pain lessens, start normal, slow movements and normal activities. °· Only take medicine as told by your doctor. °· Use crutches as told. °· Raise (elevate) your painful joint. Use pillows for propping your legs and hips. °· Get a massage to lessen pain. °GET HELP RIGHT AWAY IF: °· Your pain increases or does not improve during treatment. °· You have a fever. °· You feel heat coming from the affected area. °· You see redness and puffiness around the affected area. °· You have any questions or concerns. °MAKE SURE YOU: °· Understand these instructions. °· Will watch your condition. °· Will get help right away if you are not well or get worse. °Document Released: 06/06/2010 Document Revised: 07/27/2011 Document Reviewed: 06/06/2010 °[employer]® Patient Information ©2014 [employer]. ° °

## 2013-07-14 ENCOUNTER — Encounter: Payer: Self-pay | Admitting: Obstetrics

## 2013-07-15 NOTE — MAU Provider Note (Signed)
`````  Attestation of Attending Supervision of Advanced Practitioner: Evaluation and management procedures were performed by the PA/NP/CNM/OB Fellow under my supervision/collaboration. Chart reviewed and agree with management and plan.  Jonnie Kind 07/15/2013 7:42 PM

## 2013-09-15 ENCOUNTER — Ambulatory Visit: Payer: BC Managed Care – PPO | Admitting: Obstetrics & Gynecology

## 2013-11-02 ENCOUNTER — Other Ambulatory Visit: Payer: Self-pay | Admitting: *Deleted

## 2013-11-02 DIAGNOSIS — A6000 Herpesviral infection of urogenital system, unspecified: Secondary | ICD-10-CM

## 2013-11-02 DIAGNOSIS — Z0000 Encounter for general adult medical examination without abnormal findings: Secondary | ICD-10-CM

## 2013-11-02 MED ORDER — VALACYCLOVIR HCL 500 MG PO TABS: 500.0000 mg | ORAL_TABLET | Freq: Two times a day (BID) | ORAL | Status: DC

## 2013-11-02 MED ORDER — PNV PRENATAL PLUS MULTIVITAMIN 27-1 MG PO TABS: 1.0000 | ORAL_TABLET | Freq: Every day | ORAL | Status: DC

## 2013-11-24 ENCOUNTER — Telehealth: Payer: Self-pay | Admitting: *Deleted

## 2013-11-24 NOTE — Telephone Encounter (Signed)
Patient called and requested a refill of her diet medication- Phentermine. Patient states she is out of town for 5 days and would like the RX sent to her location in [place]. 12:55 Patient notified Dr is out of the office. Will discuss RF - patient states she tried it in the past, but she has made some lifestyle changes and is exercising now and would like to try it again.

## 2013-11-27 NOTE — Telephone Encounter (Signed)
Needs Physician consult.

## 2013-11-27 NOTE — Telephone Encounter (Signed)
Forward to [place] to make appointment

## 2013-11-29 NOTE — Telephone Encounter (Signed)
[phone] - 4:42pm - brm - Left patient voive mail to please call and schedule annual exam.

## 2014-01-23 ENCOUNTER — Ambulatory Visit: Payer: BC Managed Care – PPO | Admitting: Obstetrics

## 2014-02-20 ENCOUNTER — Ambulatory Visit: Payer: BC Managed Care – PPO | Admitting: Obstetrics

## 2014-03-19 ENCOUNTER — Encounter (HOSPITAL_COMMUNITY): Payer: Self-pay | Admitting: *Deleted

## 2014-03-20 ENCOUNTER — Ambulatory Visit: Payer: BC Managed Care – PPO | Admitting: Obstetrics

## 2014-05-14 ENCOUNTER — Encounter: Payer: Self-pay | Admitting: *Deleted

## 2014-05-15 ENCOUNTER — Telehealth: Payer: Self-pay | Admitting: *Deleted

## 2014-05-15 ENCOUNTER — Encounter: Payer: Self-pay | Admitting: Obstetrics & Gynecology

## 2014-05-15 DIAGNOSIS — A6000 Herpesviral infection of urogenital system, unspecified: Secondary | ICD-10-CM

## 2014-05-15 MED ORDER — VALACYCLOVIR HCL 500 MG PO TABS: 500.0000 mg | ORAL_TABLET | Freq: Two times a day (BID) | ORAL | Status: DC

## 2014-05-15 NOTE — Telephone Encounter (Signed)
Patient is out of town and in need of a refill on her Valtrex. Per nursing protocol prescription sent to the pharmacy and patient notified.

## 2014-07-11 ENCOUNTER — Ambulatory Visit (INDEPENDENT_AMBULATORY_CARE_PROVIDER_SITE_OTHER): Payer: BC Managed Care – PPO | Admitting: Neurology

## 2014-07-11 ENCOUNTER — Encounter: Payer: Self-pay | Admitting: Neurology

## 2014-07-11 VITALS — BP 132/83 | HR 73 | Ht 66.0 in | Wt 269.2 lb

## 2014-07-11 DIAGNOSIS — G43719 Chronic migraine without aura, intractable, without status migrainosus: Secondary | ICD-10-CM

## 2014-07-11 DIAGNOSIS — G43909 Migraine, unspecified, not intractable, without status migrainosus: Secondary | ICD-10-CM

## 2014-07-11 HISTORY — DX: Migraine, unspecified, not intractable, without status migrainosus: G43.909

## 2014-07-11 MED ORDER — TOPIRAMATE 25 MG PO TABS: ORAL_TABLET | ORAL | Status: DC

## 2014-07-11 MED ORDER — SUMATRIPTAN SUCCINATE 100 MG PO TABS: 100.0000 mg | ORAL_TABLET | Freq: Once | ORAL | Status: DC | PRN

## 2014-07-11 NOTE — Patient Instructions (Signed)
Topamax (topiramate) is a seizure medication that has an FDA approval for seizures and for migraine headache. Potential side effects of this medication include weight loss, cognitive slowing, tingling in the fingers and toes, and carbonated drinks will taste bad. If any significant side effects are noted on this drug, please contact our office.  Migraine Headache A migraine headache is an intense, throbbing pain on one or both sides of your head. A migraine can last for 30 minutes to several hours. CAUSES  The exact cause of a migraine headache is not always known. However, a migraine may be caused when nerves in the brain become irritated and release chemicals that cause inflammation. This causes pain. Certain things may also trigger migraines, such as:  Alcohol.  Smoking.  Stress.  Menstruation.  Aged cheeses.  Foods or drinks that contain nitrates, glutamate, aspartame, or tyramine.  Lack of sleep.  Chocolate.  Caffeine.  Hunger.  Physical exertion.  Fatigue.  Medicines used to treat chest pain (nitroglycerine), birth control pills, estrogen, and some blood pressure medicines. SIGNS AND SYMPTOMS  Pain on one or both sides of your head.  Pulsating or throbbing pain.  Severe pain that prevents daily activities.  Pain that is aggravated by any physical activity.  Nausea, vomiting, or both.  Dizziness.  Pain with exposure to bright lights, loud noises, or activity.  General sensitivity to bright lights, loud noises, or smells. Before you get a migraine, you may get warning signs that a migraine is coming (aura). An aura may include:  Seeing flashing lights.  Seeing bright spots, halos, or zigzag lines.  Having tunnel vision or blurred vision.  Having feelings of numbness or tingling.  Having trouble talking.  Having muscle weakness. DIAGNOSIS  A migraine headache is often diagnosed based on:  Symptoms.  Physical exam.  A CT scan or MRI of your  head. These imaging tests cannot diagnose migraines, but they can help rule out other causes of headaches. TREATMENT Medicines may be given for pain and nausea. Medicines can also be given to help prevent recurrent migraines.  HOME CARE INSTRUCTIONS  Only take over-the-counter or prescription medicines for pain or discomfort as directed by your health care provider. The use of long-term narcotics is not recommended.  Lie down in a dark, quiet room when you have a migraine.  Keep a journal to find out what may trigger your migraine headaches. For example, write down:  What you eat and drink.  How much sleep you get.  Any change to your diet or medicines.  Limit alcohol consumption.  Quit smoking if you smoke.  Get 7-9 hours of sleep, or as recommended by your health care provider.  Limit stress.  Keep lights dim if bright lights bother you and make your migraines worse. SEEK IMMEDIATE MEDICAL CARE IF:   Your migraine becomes severe.  You have a fever.  You have a stiff neck.  You have vision loss.  You have muscular weakness or loss of muscle control.  You start losing your balance or have trouble walking.  You feel faint or pass out.  You have severe symptoms that are different from your first symptoms. MAKE SURE YOU:   Understand these instructions.  Will watch your condition.  Will get help right away if you are not doing well or get worse. Document Released: 05/04/2005 Document Revised: 09/18/2013 Document Reviewed: 01/09/2013 [facility] Patient Information 2015 [place], [place]. This information is not intended to replace advice given to you by your  health care provider. Make sure you discuss any questions you have with your health care provider.

## 2014-07-11 NOTE — Progress Notes (Signed)
Reason for visit: Headache  BRODIE SCOVELL is a 34 y.o. female  History of present illness:  Ms. Baxley is a 34 year old right-handed black female with a history of morbid obesity and headache. The patient indicates that she began having headache during pregnancy in 2008. She does not recall what medications were used during that time. Her headaches have become more frequent and severe over the last one year, however. The headaches are lasting for one week at a time. She may have 14 to 20 days a month with headache. She indicates that the headaches come up from the back of the head, primarily on the left. The patient will have pain across the frontal areas. She will have stiffness in the neck. She indicates that as a headache is coming on, she will develop right hand numbness and left leg and foot numbness. She may have some nausea and dizziness without vomiting. She may feel like she is going to black out. Tylenol will help some with headache. She does report some photophobia, no phonophobia with the headache. She does not note any particular activating factors, but she is exposed to mold in her apartment that is quite severe at this time. She is sent to this office for an evaluation. She currently is on no prescription medications for migraine at this time. There is no family history of migraine.  Past Medical History  Diagnosis Date  . Irregular periods/menstrual cycles 10/04/2009  . History of dysmenorrhea   . Fibroids     Uterine unspecified  . Herpes   . Hypertension   . Obesity   . History of migraine headaches     Seen By Dr. Venia Minks  . Heart murmur   . Anemia   . Headache(784.0)   . Migraine 07/11/2014    Past Surgical History  Procedure Laterality Date  . Cesarean section       x 2  . Wisdom tooth extraction      Family History  Problem Relation Age of Onset  . Diabetes    . Heart disease      maternal grandparents  . Hypercholesterolemia    . Gout      . Congestive Heart Failure    . Alzheimer's disease    . Heart attack    . Ovarian cancer      Aunt  . Breast cancer Mother     maternal aunt  . Hypertension Mother   . Stroke Maternal Grandmother   . Kidney disease Maternal Grandmother   . Hypertension Brother   . Diabetes Paternal Grandmother   . Breast cancer Maternal Aunt   . Hypertension Father   . Migraines Neg Hx     Social history:  reports that she has never smoked. She has never used smokeless tobacco. She reports that she does not drink alcohol or use illicit drugs.  Medications:  Current Outpatient Prescriptions on File Prior to Visit  Medication Sig Dispense Refill  . amLODipine (NORVASC) 10 MG tablet Take 10 mg by mouth daily.    Marland Kitchen ibuprofen (ADVIL,MOTRIN) 800 MG tablet Take 1 tablet (800 mg total) by mouth every 8 (eight) hours as needed. 60 tablet 1  . Iron TABS Take 1 tablet by mouth as needed.     . Multiple Vitamin (MULTIVITAMIN) tablet Take 1 tablet by mouth daily.    . valACYclovir (VALTREX) 500 MG tablet Take 1 tablet (500 mg total) by mouth 2 (two) times daily. (Patient taking differently: Take 500  mg by mouth as needed. ) 30 tablet prn   No current facility-administered medications on file prior to visit.     No Known Allergies  ROS:  Out of a complete 14 system review of symptoms, the patient complains only of the following symptoms, and all other reviewed systems are negative.  Fatigue Ringing in the ears, runny nose Eye itching, eye redness, blurred vision Cough Heart murmur Nausea Frequent waking, daytime sleepiness Back pain, neck pain, neck stiffness Skin rash, itching Dizziness, headache, numbness  Blood pressure 132/83, pulse 73, height 5\' 6"  (1.676 m), weight 269 lb 3.2 oz (122.108 kg).  Physical Exam  General: The patient is alert and cooperative at the time of the examination. The patient is markedly obese.  Eyes: Pupils are equal, round, and reactive to light. Discs are flat  bilaterally.  Neck: The neck is supple, no carotid bruits are noted.  Respiratory: The respiratory examination is clear.  Cardiovascular: The cardiovascular examination reveals a regular rate and rhythm, no obvious murmurs or rubs are noted.  Neuromuscular: Range of movement of the cervical spine is full, no crepitus is noted in the temporomandibular joints on either side.  Skin: Extremities are without significant edema.  Neurologic Exam  Mental status: The patient is alert and oriented x 3 at the time of the examination. The patient has apparent normal recent and remote memory, with an apparently normal attention span and concentration ability.  Cranial nerves: Facial symmetry is present. There is good sensation of the face to pinprick and soft touch bilaterally. The strength of the facial muscles and the muscles to head turning and shoulder shrug are normal bilaterally. Speech is well enunciated, no aphasia or dysarthria is noted. Extraocular movements are full. Visual fields are full. The tongue is midline, and the patient has symmetric elevation of the soft palate. No obvious hearing deficits are noted.  Motor: The motor testing reveals 5 over 5 strength of all 4 extremities. Good symmetric motor tone is noted throughout.  Sensory: Sensory testing is intact to pinprick, soft touch, vibration sensation, and position sense on all 4 extremities, with exception that there is some decrease in pinprick sensation on the left leg as compared to the right.. No evidence of extinction is noted.  Coordination: Cerebellar testing reveals good finger-nose-finger and heel-to-shin bilaterally.  Gait and station: Gait is normal. Tandem gait is normal. Romberg is negative. No drift is seen.  Reflexes: Deep tendon reflexes are symmetric and normal bilaterally. Toes are downgoing bilaterally.   Assessment/Plan:  1. Intractable migraine headache  2. Morbid obesity  The patient is having frequent  headaches events. The patient will be started on Topamax working up to 75 mg at night. Imitrex was given to take if needed for the migraine. MRI evaluation of the brain will be done given her sensory complaints that occur prior to the onset of the headache. She will follow-up in 4 months.  Jill Alexanders MD 07/11/2014 8:50 PM  [facility] [address] [place], [place] [address]  Phone [phone] Fax [phone]

## 2014-09-12 ENCOUNTER — Encounter: Payer: Self-pay | Admitting: Obstetrics

## 2014-09-12 ENCOUNTER — Ambulatory Visit (INDEPENDENT_AMBULATORY_CARE_PROVIDER_SITE_OTHER): Payer: BC Managed Care – PPO | Admitting: Obstetrics

## 2014-09-12 VITALS — BP 169/99 | HR 85 | Wt 268.0 lb

## 2014-09-12 DIAGNOSIS — R21 Rash and other nonspecific skin eruption: Secondary | ICD-10-CM

## 2014-09-12 DIAGNOSIS — D509 Iron deficiency anemia, unspecified: Secondary | ICD-10-CM | POA: Diagnosis not present

## 2014-09-12 MED ORDER — CLOTRIMAZOLE-BETAMETHASONE 1-0.05 % EX CREA: 1.0000 "application " | TOPICAL_CREAM | Freq: Two times a day (BID) | CUTANEOUS | Status: DC

## 2014-09-12 NOTE — Progress Notes (Signed)
Patient ID: Carmen [name], female   DOB: 1980/12/17, 34 y.o.   MRN: [id number]  Chief Complaint  Patient presents with  . Rash    vaginal rash    HPI Carmen [name] is a 34 y.o. female.  Very itchy rash on vulva for past week.  Denies vaginal discharge.  HPI  Past Medical History  Diagnosis Date  . Irregular periods/menstrual cycles 10/04/2009  . History of dysmenorrhea   . Fibroids     Uterine unspecified  . Herpes   . Hypertension   . Obesity   . History of migraine headaches     Seen By Dr. Venia Minks  . Heart murmur   . Anemia   . Headache(784.0)   . Migraine 07/11/2014    Past Surgical History  Procedure Laterality Date  . Cesarean section       x 2  . Wisdom tooth extraction      Family History  Problem Relation Age of Onset  . Diabetes    . Heart disease      maternal grandparents  . Hypercholesterolemia    . Gout    . Congestive Heart Failure    . Alzheimer's disease    . Heart attack    . Ovarian cancer      Aunt  . Breast cancer Mother     maternal aunt  . Hypertension Mother   . Stroke Maternal Grandmother   . Kidney disease Maternal Grandmother   . Hypertension Brother   . Diabetes Paternal Grandmother   . Breast cancer Maternal Aunt   . Hypertension Father   . Migraines Neg Hx     Social History History  Substance Use Topics  . Smoking status: Never Smoker   . Smokeless tobacco: Never Used  . Alcohol Use: No    No Known Allergies  Current Outpatient Prescriptions  Medication Sig Dispense Refill  . ibuprofen (ADVIL,MOTRIN) 800 MG tablet Take 1 tablet (800 mg total) by mouth every 8 (eight) hours as needed. 60 tablet 1  . topiramate (TOPAMAX) 25 MG tablet Take one tablet at night for one week, then take 2 tablets at night for one week, then take 3 tablets at night. 90 tablet 3  . valACYclovir (VALTREX) 500 MG tablet Take 1 tablet (500 mg total) by mouth 2 (two) times daily. (Patient taking differently: Take 500 mg by mouth  as needed. ) 30 tablet prn  . amLODipine (NORVASC) 10 MG tablet Take 10 mg by mouth daily.    . clotrimazole-betamethasone (LOTRISONE) cream Apply 1 application topically 2 (two) times daily. Do not use more than 14 days. 45 g 0  . Iron TABS Take 1 tablet by mouth as needed.     . Multiple Vitamin (MULTIVITAMIN) tablet Take 1 tablet by mouth daily.    . SUMAtriptan (IMITREX) 100 MG tablet Take 1 tablet (100 mg total) by mouth once as needed for migraine. (Patient not taking: Reported on 09/12/2014) 10 tablet 2   No current facility-administered medications for this visit.    Review of Systems Review of Systems Constitutional: negative for fatigue and weight loss Respiratory: negative for cough and wheezing Cardiovascular: negative for chest pain, fatigue and palpitations Gastrointestinal: negative for abdominal pain and change in bowel habits Genitourinary: vulva rash and itching Integument/breast: negative for nipple discharge Musculoskeletal:negative for myalgias Neurological: negative for gait problems and tremors Behavioral/Psych: negative for abusive relationship, depression Endocrine: negative for temperature intolerance     Blood pressure 169/99, pulse  85, weight 268 lb (121.564 kg).  Physical Exam Physical Exam:        Vulva:  Erythematous rash on both labia majora.  Excoriations present.                                                                          Data Reviewed  Labs  Assessment     Vulva rash     Plan    Lotrisone Rx F/U 6 weeks.   Orders Placed This Encounter  Procedures  . SureSwab Bacterial Vaginosis/itis   Meds ordered this encounter  Medications  . clotrimazole-betamethasone (LOTRISONE) cream    Sig: Apply 1 application topically 2 (two) times daily. Do not use more than 14 days.    Dispense:  45 g    Refill:  0

## 2014-09-15 ENCOUNTER — Other Ambulatory Visit: Payer: Self-pay | Admitting: Obstetrics

## 2014-09-15 DIAGNOSIS — N760 Acute vaginitis: Principal | ICD-10-CM

## 2014-09-15 DIAGNOSIS — B9689 Other specified bacterial agents as the cause of diseases classified elsewhere: Secondary | ICD-10-CM

## 2014-09-15 LAB — SURESWAB BACTERIAL VAGINOSIS/ITIS
Atopobium vaginae: NOT DETECTED Log (cells/mL)
BV CATEGORY: UNDETERMINED — AB
C. GLABRATA, DNA: NOT DETECTED
C. TROPICALIS, DNA: NOT DETECTED
C. albicans, DNA: NOT DETECTED
C. parapsilosis, DNA: NOT DETECTED
LACTOBACILLUS SPECIES: 6.7 Log (cells/mL)
MEGASPHAERA SPECIES: NOT DETECTED Log (cells/mL)
T. VAGINALIS RNA, QL TMA: NOT DETECTED

## 2014-09-15 MED ORDER — TINIDAZOLE 500 MG PO TABS: 1000.0000 mg | ORAL_TABLET | Freq: Every day | ORAL | Status: DC

## 2014-09-25 ENCOUNTER — Ambulatory Visit: Payer: BC Managed Care – PPO | Admitting: Obstetrics

## 2014-11-09 ENCOUNTER — Ambulatory Visit: Payer: BC Managed Care – PPO | Admitting: Nurse Practitioner

## 2014-11-21 ENCOUNTER — Ambulatory Visit: Payer: BC Managed Care – PPO | Admitting: Nurse Practitioner

## 2014-11-22 ENCOUNTER — Encounter: Payer: Self-pay | Admitting: Nurse Practitioner

## 2014-11-29 ENCOUNTER — Telehealth: Payer: Self-pay | Admitting: *Deleted

## 2014-11-29 NOTE — Telephone Encounter (Signed)
LMVM for pt to return call about MRI ordered, and I donot see report.  Has appt tomorrow with CM/NP.

## 2014-11-30 ENCOUNTER — Encounter: Payer: Self-pay | Admitting: Nurse Practitioner

## 2014-11-30 ENCOUNTER — Ambulatory Visit (INDEPENDENT_AMBULATORY_CARE_PROVIDER_SITE_OTHER): Payer: BC Managed Care – PPO | Admitting: Nurse Practitioner

## 2014-11-30 VITALS — BP 138/86 | HR 63 | Ht 65.5 in | Wt 265.2 lb

## 2014-11-30 DIAGNOSIS — G43719 Chronic migraine without aura, intractable, without status migrainosus: Secondary | ICD-10-CM

## 2014-11-30 DIAGNOSIS — E669 Obesity, unspecified: Secondary | ICD-10-CM | POA: Diagnosis not present

## 2014-11-30 DIAGNOSIS — G4710 Hypersomnia, unspecified: Secondary | ICD-10-CM | POA: Diagnosis not present

## 2014-11-30 DIAGNOSIS — R400 Somnolence: Secondary | ICD-10-CM

## 2014-11-30 MED ORDER — TOPIRAMATE 25 MG PO TABS: 75.0000 mg | ORAL_TABLET | Freq: Every day | ORAL | Status: DC

## 2014-11-30 NOTE — Patient Instructions (Signed)
Continue Topamax for migraines for now Set up for sleep study Follow up after sleep study completed

## 2014-11-30 NOTE — Progress Notes (Signed)
I have read the note, and I agree with the clinical assessment and plan.  Valerye Kobus KEITH   

## 2014-11-30 NOTE — Telephone Encounter (Signed)
No call back.   Pt arrived at appt today.

## 2014-11-30 NOTE — Progress Notes (Signed)
[facility]  PATIENT: Carmen [name] DOB: Apr 14, 1981   REASON FOR VISIT: Follow-up for migraine, morbid obesity new complaint of daytime drowsiness morning headache HISTORY FROM: Patient    HISTORY OF PRESENT ILLNESS:Carmen [name] is a 34 year old right-handed black female with a history of morbid obesity and headache. She was last seen in the office by Dr. Jannifer [name] 07/11/2014 The patient indicates that she began having headache during pregnancy in 2008. She does not recall what medications were used during that time. Her headaches have become more frequent and severe over the last one year, however. The headaches are lasting for one week at a time. She may have 14 to 20 days a month with headache. She indicates that the headaches come up from the back of the head, primarily on the left. The patient will have pain across the frontal areas. She will have stiffness in the neck. She indicates that as a headache is coming on, she will develop right hand numbness and left leg and foot numbness. She may have some nausea and dizziness without vomiting. She may feel like she is going to black out. Tylenol will help some with headache. She does report some photophobia, no phonophobia with the headache. She does not note any particular activating factors. She was placed on Topamax at her last visit and MRI was ordered due to her sensory changes however she did not get the MRI done due to out of  pocket cost. There is no family history of migraine. She also complains today of daytime drowsiness, morning headache and snoring.   REVIEW OF SYSTEMS: Full 14 system review of systems performed and notable only for those listed, all others are neg:  Constitutional: Fatigue  Cardiovascular: neg Ear/Nose/Throat: neg  Skin: neg Eyes: neg Respiratory: neg Gastroitestinal: Nausea Hematology/Lymphatic: neg  Endocrine: neg Musculoskeletal: Back pain neck pain Allergy/Immunology:  neg Neurological: Headache, numbness dizziness Psychiatric: neg Sleep : Daytime drowsiness, snoring ALLERGIES: No Known Allergies  HOME MEDICATIONS: Outpatient Prescriptions Prior to Visit  Medication Sig Dispense Refill  . amLODipine (NORVASC) 10 MG tablet Take 10 mg by mouth daily.    . clotrimazole-betamethasone (LOTRISONE) cream Apply 1 application topically 2 (two) times daily. Do not use more than 14 days. (Patient taking differently: Apply 1 application topically 2 (two) times daily as needed. Do not use more than 14 days.) 45 g 0  . ibuprofen (ADVIL,MOTRIN) 800 MG tablet Take 1 tablet (800 mg total) by mouth every 8 (eight) hours as needed. 60 tablet 1  . Iron TABS Take 1 tablet by mouth as needed.     . Multiple Vitamin (MULTIVITAMIN) tablet Take 1 tablet by mouth daily.    . SUMAtriptan (IMITREX) 100 MG tablet Take 1 tablet (100 mg total) by mouth once as needed for migraine. 10 tablet 2  . topiramate (TOPAMAX) 25 MG tablet Take one tablet at night for one week, then take 2 tablets at night for one week, then take 3 tablets at night. (Patient taking differently: Take 75 mg by mouth daily. ) 90 tablet 3  . valACYclovir (VALTREX) 500 MG tablet Take 1 tablet (500 mg total) by mouth 2 (two) times daily. (Patient taking differently: Take 500 mg by mouth as needed. ) 30 tablet prn  . tinidazole (TINDAMAX) 500 MG tablet Take 2 tablets (1,000 mg total) by mouth daily with breakfast. 10 tablet 2   No facility-administered medications prior to visit.    PAST MEDICAL HISTORY: Past Medical History  Diagnosis Date  .  Irregular periods/menstrual cycles 10/04/2009  . History of dysmenorrhea   . Fibroids     Uterine unspecified  . Herpes   . Hypertension   . Obesity   . History of migraine headaches     Seen By Dr. Venia Minks  . Heart murmur   . Anemia   . Headache(784.0)   . Migraine 07/11/2014    PAST SURGICAL HISTORY: Past Surgical History  Procedure Laterality Date  .  Cesarean section       x 2  . Wisdom tooth extraction      FAMILY HISTORY: Family History  Problem Relation Age of Onset  . Diabetes    . Heart disease      maternal grandparents  . Hypercholesterolemia    . Gout    . Congestive Heart Failure    . Alzheimer's disease    . Heart attack    . Ovarian cancer      Aunt  . Breast cancer Mother     maternal aunt  . Hypertension Mother   . Stroke Maternal Grandmother   . Kidney disease Maternal Grandmother   . Hypertension Brother   . Diabetes Paternal Grandmother   . Breast cancer Maternal Aunt   . Hypertension Father   . Migraines Neg Hx     SOCIAL HISTORY: History   Social History  . Marital Status: Single    Spouse Name: N/A  . Number of Children: 2  . Years of Education: N/A   Occupational History  . Not on file.   Social History Main Topics  . Smoking status: Never Smoker   . Smokeless tobacco: Never Used  . Alcohol Use: No  . Drug Use: No  . Sexual Activity: Not Currently    Birth Control/ Protection: IUD   Other Topics Concern  . Not on file   Social History Narrative   Patient is right handed.   Patient drinks one glass of caffeine daily.     PHYSICAL EXAM  Filed Vitals:   11/30/14 1054  BP: 138/86  Pulse: 63  Height: 5' 5.5" (1.664 m)  Weight: 265 lb 3.2 oz (120.294 kg)   Body mass index is 43.44 kg/(m^2). General: The patient is alert and cooperative at the time of the examination. The patient is markedly obese. Head normocephalic and atraumatic mallopattii4 Neck: The neck is supple, no carotid bruits are noted.Neck size 18 Respiratory: The respiratory examination is clear. Cardiovascular: The cardiovascular examination reveals a regular rate and rhythm, no obvious murmurs or rubs are noted. Neuromuscular: Range of movement of the cervical spine is full, no crepitus is noted in the temporomandibular joints on either side. Skin: Extremities are without significant edema.  Neurologic  Exam  Mental status: The patient is alert and oriented x 3 at the time of the examination. The patient has apparent normal recent and remote memory, with an apparently normal attention span and concentration ability.ESS 15.   Cranial nerves: Facial symmetry is present. There is good sensation of the face to pinprick and soft touch bilaterally. The strength of the facial muscles and the muscles to head turning and shoulder shrug are normal bilaterally. Speech is well enunciated, no aphasia or dysarthria is noted.Pupils are equal, round, and reactive to light. Discs are flat bilaterally.  Extraocular movements are full. Visual fields are full. The tongue is midline, and the patient has symmetric elevation of the soft palate. No obvious hearing deficits are noted. Motor: The motor testing reveals 5 over 5 strength  of all 4 extremities. Good symmetric motor tone is noted throughout. Sensory: Sensory testing is intact to pinprick, soft touch, vibration sensation, and position sense on all 4 extremities, with exception that there is some decrease in pinprick sensation on the left leg as compared to the right.. No evidence of extinction is noted. Coordination: Cerebellar testing reveals good finger-nose-finger and heel-to-shin bilaterally. Gait and station: Gait is normal. Tandem gait is normal. Romberg is negative. No drift is seen. Reflexes: Deep tendon reflexes are symmetric and normal bilaterally. Toes are downgoing bilaterally.  DIAGNOSTIC DATA (LABS, IMAGING, TESTING) - I reviewed patient records, labs, notes, testing and imaging myself where available.  Lab Results  Component Value Date   WBC 7.1 06/27/2013   HGB 10.8* 06/27/2013   HCT 33.3* 06/27/2013   MCV 74.5* 06/27/2013   PLT 412* 06/27/2013      Component Value Date/Time   NA 138 03/09/2013 1206   K 4.3 03/09/2013 1206   CL 105 03/09/2013 1206   CO2 25 03/09/2013 1206   GLUCOSE 106* 03/09/2013 1206   BUN 8 03/09/2013 1206    CREATININE 0.69 03/09/2013 1206   CREATININE .9 07/21/2010 1940   CALCIUM 9.1 03/09/2013 1206   PROT 7.3 03/09/2013 1206   ALBUMIN 4.0 03/09/2013 1206   AST 12 03/09/2013 1206   ALT 13 03/09/2013 1206   ALKPHOS 63 03/09/2013 1206   BILITOT 0.3 03/09/2013 1206   GFRNONAA >60 07/21/2010 1940   GFRAA  07/21/2010 1940    >60        The eGFR has been calculated using the MDRD equation. This calculation has not been validated in all clinical situations. eGFR's persistently <60 mL/min signify possible Chronic Kidney Disease.     ASSESSMENT AND PLAN  34 y.o. year old female  has a past medical history of Obesity; History of migraine headaches; a new complaint of daytime drowsiness morning headache.  Continue Topamax for migraines for now Set up for sleep study Follow up after sleep study completed Dennie Bible, [facility], [facility], APRN  [facility] [address] [place], [place] [address] [phone]

## 2015-01-02 ENCOUNTER — Ambulatory Visit (INDEPENDENT_AMBULATORY_CARE_PROVIDER_SITE_OTHER): Payer: BC Managed Care – PPO | Admitting: Neurology

## 2015-01-02 ENCOUNTER — Encounter: Payer: Self-pay | Admitting: Neurology

## 2015-01-02 VITALS — BP 138/87 | HR 70 | Temp 98.1°F | Ht 65.5 in | Wt 266.0 lb

## 2015-01-02 DIAGNOSIS — G4710 Hypersomnia, unspecified: Secondary | ICD-10-CM | POA: Diagnosis not present

## 2015-01-02 DIAGNOSIS — Z72820 Sleep deprivation: Secondary | ICD-10-CM | POA: Diagnosis not present

## 2015-01-02 DIAGNOSIS — R0683 Snoring: Secondary | ICD-10-CM

## 2015-01-02 DIAGNOSIS — G4730 Sleep apnea, unspecified: Secondary | ICD-10-CM | POA: Diagnosis not present

## 2015-01-02 NOTE — Patient Instructions (Signed)
Polysomnography (Sleep Studies) Polysomnography (PSG) is a series of tests used for detecting (diagnosing) obstructive sleep apnea and other sleep disorders. The tests measure how some parts of your body are working while you are sleeping. The tests are extensive and expensive. They are done in a sleep lab or hospital, and vary from center to center. Your caregiver may perform other more simple sleep studies and questionnaires before doing more complete and involved testing. Testing may not be covered by insurance. Some of these tests are:  An EEG (Electroencephalogram). This tests your brain waves and stages of sleep.  An EOG (Electrooculogram). This measures the movements of your eyes. It detects periods of REM (rapid eye movement) sleep, which is your dream sleep.  An EKG (Electrocardiogram). This measures your heart rhythm.  EMG (Electromyography). This is a measurement of how the muscles are working in your upper airway and your legs while sleeping.  An oximetry measurement. It measures how much oxygen (air) you are getting while sleeping.  Breathing efforts may be measured. The same test can be interpreted (understood) differently by different caregivers and centers that study sleep.  Studies may be given an apnea/hypopnea index (AHI). This is a number which is found by counting the times of no breathing or under breathing during the night, and relating those numbers to the amount of time spent in bed. When the AHI is greater than 15, the patient is likely to complain of daytime sleepiness. When the AHI is greater than 30, the patient is at increased risk for heart problems and must be followed more closely. Following the AHI also allows you to know how treatment is working. Simple oximetry (tracking the amount of oxygen that is taken in) can be used for screening patients who:  Do not have symptoms (problems) of OSA.  Have a normal Epworth Sleepiness Scale Score.  Have a low pre-test  probability of having OSA.  Have none of the upper airway problems likely to cause apnea.  Oximetry is also used to determine if treatment is effective in patients who showed significant desaturations (not getting enough oxygen) on their home sleep study. One extra measure of safety is to perform additional studies for the person who only snores. This is because no one can predict with absolute certainty who will have OSA. Those who show significant desaturations (not getting enough oxygen) are recommended to have a more detailed sleep study. Document Released: 11/08/2002 Document Revised: 07/27/2011 Document Reviewed: 07/10/2013 [employer] Patient Information 2015 [employer]. This information is not intended to replace advice given to you by your health care provider. Make sure you discuss any questions you have with your health care provider.  

## 2015-01-02 NOTE — Progress Notes (Signed)
[facility]   Provider:  Larey Seat, M D  Referring Provider: Roosvelt Maser Primary Care Physician:  Steva Colder, PA-C  Chief Complaint  Patient presents with  . New Evaluation    Sleep consult. In room 10 with 2 kids.     HPI:  Carmen [name] is a 34 y.o. female , seen here as a internal referral from Dr Jannifer Franklin and Cecille Rubin NP,  (originally PA. Trilby Drummer ) , for a sleep evlauation,  Chief complaint according to patient : "I sleep poorly, I am tired and drowsy".  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 She reports that she has been gaining weight continuously for the last 2 perhaps even the last 5 years. She developed frequent headaches that sometimes last several days. She has been seeing Dr. Jannifer Franklin for these. He cites morbid obesity she has problems with hypertension, iron deficiency, migraine headaches, uterine fibroids, she had 2 cesarean sections and a wisdom tooth extraction. She has never been evaluated for sleep disorder before but feels certain that she has sleep apnea. First of all there is a report of loud sometimes thunderous snoring. Her mother has witnessed these spells. Currently, she lives in an apartment that is mold infested and her own bedroom had to be abandoned and she sleeps on the sofa in the living room now. Her sleep habits are little off since that summer break so she is not teaching at this moment and her children are not in school either. She reports that she is falling asleep on the sofa by about midnight but she rises around 6 AM- during the school year she will be an hour earlier in bed and an hour earlier up in the morning. Overall she acknowledges that she only gets about 5-6 hours of sleep each night. Her brief sleep. As interrupted twice by bathroom breaks, but she states that her current arrangement in the living room allows her call, quiet and dark room to sleep  in. She states that she frequently wakes up with a headache on rare occasions she has been woken by headaches. These headaches are the most severe in the morning and then get lighter as the day goes on but they still can last several days. Also she doesn't nap in daytime by about 4 PM she is very tired and she reports that on her drive back from school after teaching around 5 PM she struggles to stay awake at the wheel. She has no history of neck or ENT surgeries never underwent a tonsillectomy or adenoidectomy only "canal or wisdom tooth extraction. 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  She does neither smoke, no drink and uses caffeine sparingly.        HISTORY OF PRESENT ILLNESS: Cecille Rubin : Carmen [name] is a 34 year old right-handed black female with a history of morbid obesity and headache. She was last seen in the office by Dr. Jannifer Franklin 07/11/2014 The patient indicates that she began having headache during pregnancy in 2008. She does not recall what medications were used during that time. Her headaches have become more frequent and severe over the last one year, however. The headaches are lasting for one week at a time. She may have 14 to 20 days a month with headache. She indicates that the headaches come up from the back of the head, primarily on the left. The patient will have pain  across the frontal areas. She will have stiffness in the neck. She indicates that as a headache is coming on, she will develop right hand numbness and left leg and foot numbness. She may have some nausea and dizziness without vomiting. She may feel like she is going to black out. Tylenol will help some with headache. She does report some photophobia, no phonophobia with the headache. She does not note any particular activating factors. She was placed on Topamax at her last visit and MRI was ordered due to her sensory changes however she did not get the MRI done due to out of  pocket  cost. There is no family history of migraine. She also complains today of daytime drowsiness, morning headache and snoring.  Social history: lives with 2 children, sleeps alone. 4 sodas per week.   Review of Systems: Out of a complete 14 system review, the patient complains of only the following symptoms, and all other reviewed systems are negative. Increased thirst, snoring, dry mouth, headaches, coughing, heart murmur proper trouble swallowing spasms in the neck, aching muscles, blurred vision, anemia, dizziness, decreased energy, daytime sleepiness,  Epworth score 16 out of 24 possible points, very high , Fatigue severity score 44 points  , depression score n/a .    Social History   Social History  . Marital Status: Single    Spouse Name: N/A  . Number of Children: 2  . Years of Education: 16   Occupational History  . Art Teacher- elementary     Social History Main Topics  . Smoking status: Never Smoker   . Smokeless tobacco: Never Used  . Alcohol Use: No  . Drug Use: No  . Sexual Activity: Not Currently    Birth Control/ Protection: IUD   Other Topics Concern  . Not on file   Social History Narrative   Lives at home with 2 kids.   Patient is right handed.   Patient drinks 4 sodas/week    Family History  Problem Relation Age of Onset  . Diabetes    . Heart disease      maternal grandparents  . Hypercholesterolemia    . Gout    . Congestive Heart Failure    . Alzheimer's disease    . Heart attack    . Ovarian cancer      Aunt  . Breast cancer Mother     maternal aunt  . Hypertension Mother   . Stroke Maternal Grandmother   . Kidney disease Maternal Grandmother   . Hypertension Brother   . Diabetes Paternal Grandmother   . Breast cancer Maternal Aunt   . Hypertension Father   . Migraines Neg Hx     Past Medical History  Diagnosis Date  . Irregular periods/menstrual cycles 10/04/2009  . History of dysmenorrhea   . Fibroids     Uterine unspecified  .  Herpes   . Hypertension   . Obesity   . History of migraine headaches     Seen By Dr. Venia Minks  . Heart murmur   . Anemia   . Headache(784.0)   . Migraine 07/11/2014    Past Surgical History  Procedure Laterality Date  . Cesarean section       x 2  . Wisdom tooth extraction      Current Outpatient Prescriptions  Medication Sig Dispense Refill  . amLODipine (NORVASC) 10 MG tablet Take 10 mg by mouth daily.    . clotrimazole-betamethasone (LOTRISONE) cream Apply 1 application topically 2 (  two) times daily. Do not use more than 14 days. (Patient taking differently: Apply 1 application topically 2 (two) times daily as needed. Do not use more than 14 days.) 45 g 0  . ibuprofen (ADVIL,MOTRIN) 800 MG tablet Take 1 tablet (800 mg total) by mouth every 8 (eight) hours as needed. 60 tablet 1  . Iron TABS Take 1 tablet by mouth as needed.     . Multiple Vitamin (MULTIVITAMIN) tablet Take 1 tablet by mouth daily.    . SUMAtriptan (IMITREX) 100 MG tablet Take 1 tablet (100 mg total) by mouth once as needed for migraine. 10 tablet 2  . topiramate (TOPAMAX) 25 MG tablet Take 3 tablets (75 mg total) by mouth daily. 90 tablet 4  . valACYclovir (VALTREX) 500 MG tablet Take 1 tablet (500 mg total) by mouth 2 (two) times daily. (Patient taking differently: Take 500 mg by mouth as needed. ) 30 tablet prn   No current facility-administered medications for this visit.    Allergies as of 01/02/2015  . (No Known Allergies)    Vitals: BP 138/87 mmHg  Pulse 70  Temp(Src) 98.1 F (36.7 C) (Oral)  Ht 5' 5.5" (1.664 m)  Wt 266 lb (120.657 kg)  BMI 43.58 kg/m2 Last Weight:  Wt Readings from Last 1 Encounters:  01/02/15 266 lb (120.657 kg)   LEX:NTZG mass index is 43.58 kg/(m^2).     Last Height:   Ht Readings from Last 1 Encounters:  01/02/15 5' 5.5" (1.664 m)    Physical exam:  General: The patient is awake, alert and appears not in acute distress. The patient is well groomed. Head:  Normocephalic, atraumatic. Neck is supple. Mallampati 3,  neck circumference:17.75. Nasal airflow restricted , TMJ click is evident, no pain  . Retrognathia is seen.  Cardiovascular:  Regular rate and rhythm ,  without distended neck veins. Respiratory: Lungs are clear to auscultation. Skin:  Without evidence of edema, or rash Trunk: BMI is elevated . The patient's posture is erect    Neurologic exam : The patient is awake and alert, oriented to place and time.   Attention span & concentration ability appears normal.  Speech is fluent,  without  dysarthria, dysphonia or aphasia.  Mood and affect are appropriate.  Cranial nerves: Pupils are equal and briskly reactive to light. Funduscopic exam without evidence of pallor or edema.  Extraocular movements  in vertical and horizontal planes intact and without nystagmus. Visual fields by finger perimetry are intact. Hearing to finger rub intact.   Facial sensation intact to fine touch.  Facial motor strength is symmetric and tongue and uvula move midline. Shoulder shrug was symmetrical.   Motor exam: Normal tone, muscle bulk and symmetric strength in all extremities. The patient reports that she sometimes has a weaker grip strength especially when opening a jar or turning a key.  Sensory:  Fine touch, pinprick and vibration were tested in all extremities.  She reports tingling and numbness in both hands affecting middle and ring finger more than the other fingers of the hand bilaterally. Proprioception tested in the upper extremities was normal.  Coordination: Rapid alternating movements in the fingers/hands was normal. Finger-to-nose maneuver  normal without evidence of ataxia, dysmetria or tremor.  Gait and station: Patient walks without assistive device and is able unassisted to climb up to the exam table. Strength within normal limits.  Stance is stable and normal.  Toe and hell stand were tested .Tandem gait is unfragmented. Turns with  3  Steps.  Deep tendon reflexes: in the  upper and lower extremities are symmetric and intact. Babinski maneuver response is  downgoing.  The patient was advised of the nature of the diagnosed sleep disorder , the treatment options and risks for general a health and wellness arising from not treating the condition.  I spent more than 30 minutes of face to face time with the patient. Greater than 50% of time was spent in counseling and coordination of care. We have discussed the diagnosis and differential and I answered the patient's questions.     Assessment:  After physical and neurologic examination, review of laboratory studies,  Personal review of imaging studies, reports of other /same  Imaging studies ,  Results of polysomnography/ neurophysiology testing and pre-existing records as far as provided in visit., my assessment is   1) Mrs. Javed has a very high risk of having sleep apnea. She is morbidly obese, she wakes up this morning headaches and on occasion her headaches wake her, she is a loud snorer with irregular nocturnal breathing. She has retrognathia which is another risk factor for sleep apnea a larger neck for woman at 18 inches.  2) morbid obesity  . headaches and associated hypertension, high degree of fatigue and sleepiness can be related to hypercapnia and hypoxemia at night. Hypercapnia and vascular dilation lead to obesity hypoventilation headaches..  3) excessive daytime sleepiness. The degree of sleepiness was subjectively endorsed by Epworth sleepiness score at 16 points. 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 I advised her to apply good judgment to her abilities to operate machinery or drive when sleepy. She will discuss further the treatment options depending on the sleep study results as ordered. I have ordered a split-night polysomnography at an AHI of 15, and oxygen scoring of 3% desaturation and  capnography for her.   I will follow up after the sleep study is completed her regular visits with Dr. Jannifer Franklin or Cecille Rubin are not affected by the sleep clinic follow-up appointment.    Plan:  Treatment plan and additional workup :  SPLIT 15 AHI, score 35 , capnography.  Low carb diet, exercise regimen  Driving safety - depending on response to apnea treatment (  if diagnosed ) , may need to use Modafinil to allow her to operate machinery/ drive.    Asencion Partridge Shaterica Mcclatchy MD  01/02/2015   CC: Steva Colder, Pa-c [address], [place] [address]

## 2015-02-05 ENCOUNTER — Telehealth: Payer: Self-pay | Admitting: Neurology

## 2015-02-05 NOTE — Telephone Encounter (Signed)
LVM concerning rescheduling a sleep study

## 2015-05-06 ENCOUNTER — Other Ambulatory Visit: Payer: Self-pay | Admitting: Obstetrics

## 2015-10-01 ENCOUNTER — Other Ambulatory Visit: Payer: Self-pay | Admitting: *Deleted

## 2015-10-01 ENCOUNTER — Telehealth: Payer: Self-pay | Admitting: *Deleted

## 2015-10-01 DIAGNOSIS — A6000 Herpesviral infection of urogenital system, unspecified: Secondary | ICD-10-CM

## 2015-10-01 MED ORDER — VALACYCLOVIR HCL 500 MG PO TABS: 500.0000 mg | ORAL_TABLET | Freq: Two times a day (BID) | ORAL | Status: DC

## 2015-10-01 NOTE — Telephone Encounter (Signed)
Patient is requesting a refill of her herpes medication. Rx has been sent to the pharmacy per office protocol. Patient is due an annual exam

## 2016-10-16 ENCOUNTER — Telehealth: Payer: Self-pay | Admitting: *Deleted

## 2016-10-16 DIAGNOSIS — B3731 Acute candidiasis of vulva and vagina: Secondary | ICD-10-CM

## 2016-10-16 DIAGNOSIS — B373 Candidiasis of vulva and vagina: Secondary | ICD-10-CM

## 2016-10-16 MED ORDER — FLUCONAZOLE 150 MG PO TABS: 150.0000 mg | ORAL_TABLET | ORAL | 0 refills | Status: DC

## 2016-10-16 NOTE — Telephone Encounter (Signed)
Patient is calling regarding some pain that she is having- she is trying to rule out her Mirena as the cause of the pain. Patient states she is having pan that radiates form her midline below the umbilicus to her back. Going to the right. Patient has had the pain since yesterday. Patient reports no other symptoms besides itching. Appointment offered and scheduled. Yeast treated

## 2016-10-28 ENCOUNTER — Ambulatory Visit: Payer: BC Managed Care – PPO | Admitting: Certified Nurse Midwife

## 2016-11-06 ENCOUNTER — Ambulatory Visit: Payer: BC Managed Care – PPO | Admitting: Certified Nurse Midwife

## 2016-11-23 ENCOUNTER — Other Ambulatory Visit: Payer: Self-pay | Admitting: Obstetrics

## 2016-11-24 ENCOUNTER — Other Ambulatory Visit: Payer: Self-pay | Admitting: Obstetrics

## 2016-11-25 ENCOUNTER — Other Ambulatory Visit: Payer: Self-pay | Admitting: Obstetrics

## 2016-11-25 DIAGNOSIS — L309 Dermatitis, unspecified: Secondary | ICD-10-CM

## 2016-11-25 MED ORDER — CLOTRIMAZOLE-BETAMETHASONE 1-0.05 % EX CREA: TOPICAL_CREAM | CUTANEOUS | 0 refills | Status: DC

## 2016-11-25 NOTE — Progress Notes (Signed)
Patient notified

## 2017-03-09 ENCOUNTER — Telehealth: Payer: Self-pay

## 2017-03-09 NOTE — Telephone Encounter (Signed)
Rec'vd VM from pt requesting a call back.

## 2017-03-16 ENCOUNTER — Other Ambulatory Visit: Payer: Self-pay | Admitting: Obstetrics

## 2017-03-16 DIAGNOSIS — A6000 Herpesviral infection of urogenital system, unspecified: Secondary | ICD-10-CM

## 2017-03-19 ENCOUNTER — Encounter (HOSPITAL_COMMUNITY): Payer: Self-pay

## 2017-03-19 ENCOUNTER — Emergency Department (HOSPITAL_COMMUNITY): Payer: BC Managed Care – PPO

## 2017-03-19 DIAGNOSIS — J40 Bronchitis, not specified as acute or chronic: Secondary | ICD-10-CM | POA: Insufficient documentation

## 2017-03-19 DIAGNOSIS — I10 Essential (primary) hypertension: Secondary | ICD-10-CM | POA: Insufficient documentation

## 2017-03-19 DIAGNOSIS — R0602 Shortness of breath: Secondary | ICD-10-CM | POA: Diagnosis present

## 2017-03-19 DIAGNOSIS — Z79899 Other long term (current) drug therapy: Secondary | ICD-10-CM | POA: Insufficient documentation

## 2017-03-19 IMAGING — DX DG CHEST 2V
2 series · 2 of 2 positions shown · non-contrast
Comparison: None.

CLINICAL DATA: Cough and shortness of breath. Upper respiratory
infection.

EXAM:
CHEST  2 VIEW

[w chest lat]
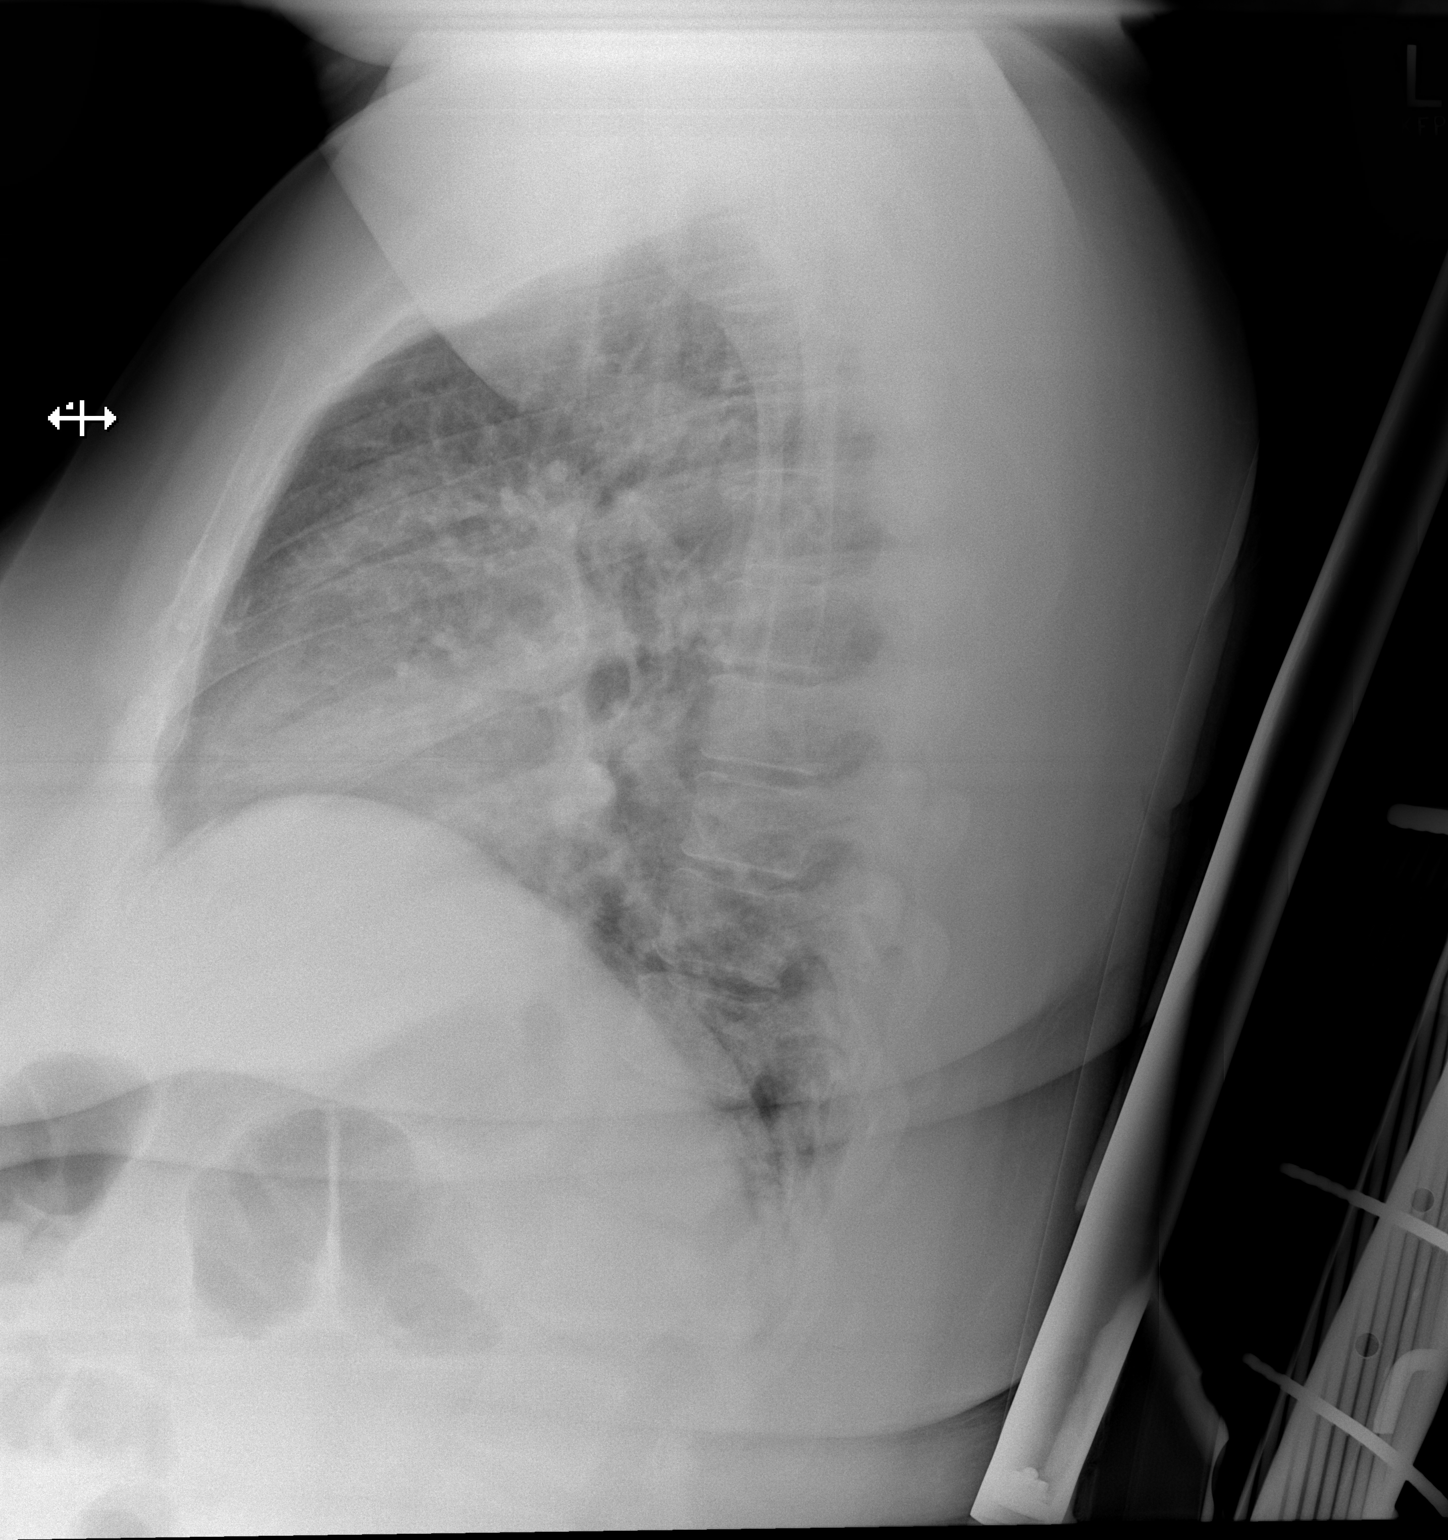

[w chest pa]
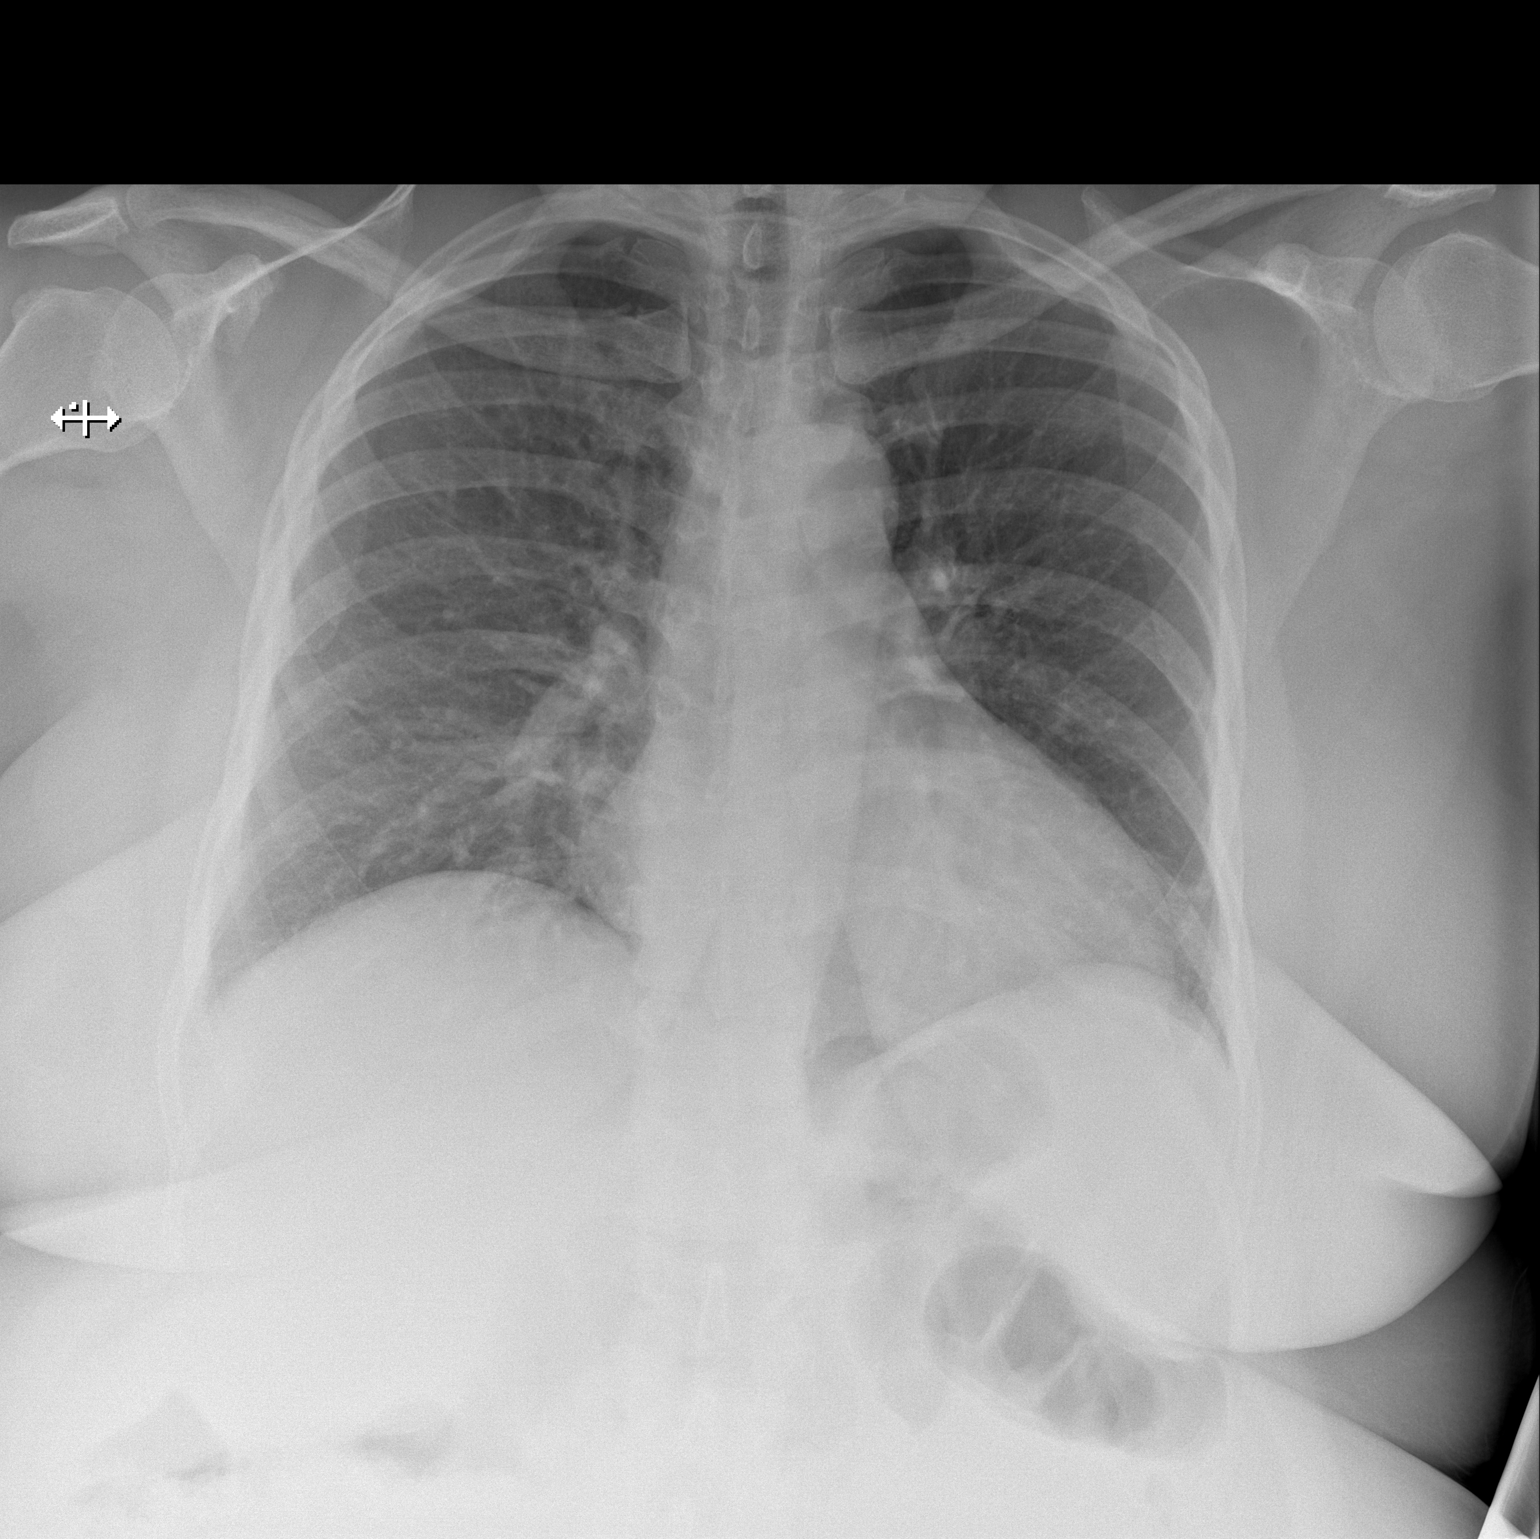

[2 of 2 positions shown; findings below may reference images not displayed]

FINDINGS: The cardiomediastinal contours are normal. Central bronchial wall
thickening. Subsegmental atelectasis at the left lung base.
Pulmonary vasculature is normal. No consolidation, pleural effusion,
or pneumothorax. No acute osseous abnormalities are seen.
IMPRESSION: Bronchial thickening without pneumonia.

## 2017-03-19 MED ORDER — ALBUTEROL SULFATE (2.5 MG/3ML) 0.083% IN NEBU
5.0000 mg | INHALATION_SOLUTION | Freq: Once | RESPIRATORY_TRACT | Status: AC
  Administered 2017-03-19: 5 mg via RESPIRATORY_TRACT

## 2017-03-19 MED ORDER — ALBUTEROL SULFATE (2.5 MG/3ML) 0.083% IN NEBU
INHALATION_SOLUTION | RESPIRATORY_TRACT | Status: AC
  Filled 2017-03-19: qty 6

## 2017-03-19 MED ORDER — ACETAMINOPHEN 325 MG PO TABS
ORAL_TABLET | ORAL | Status: AC
Start: 2017-03-19 — End: 2017-03-19
  Administered 2017-03-19: 650 mg
  Filled 2017-03-19: qty 2

## 2017-03-19 NOTE — ED Triage Notes (Signed)
Pt endorses URI x 2 weeks and is having shob. Pt has congestive non productive cough. Oral temp 99.5 in triage. Breath sounds clear.

## 2017-03-20 ENCOUNTER — Emergency Department (HOSPITAL_COMMUNITY)
Admission: EM | Admit: 2017-03-20 | Discharge: 2017-03-20 | Disposition: A | Discharge status: Home / Self Care | Payer: BC Managed Care – PPO | Attending: Emergency Medicine | Admitting: Emergency Medicine

## 2017-03-20 DIAGNOSIS — J40 Bronchitis, not specified as acute or chronic: Secondary | ICD-10-CM

## 2017-03-20 MED ORDER — IPRATROPIUM BROMIDE 0.02 % IN SOLN
0.5000 mg | Freq: Once | RESPIRATORY_TRACT | Status: AC
  Administered 2017-03-20: 0.5 mg via RESPIRATORY_TRACT
  Filled 2017-03-20: qty 2.5

## 2017-03-20 MED ORDER — BENZONATATE 100 MG PO CAPS: 100.0000 mg | ORAL_CAPSULE | Freq: Three times a day (TID) | ORAL | 0 refills | Status: DC

## 2017-03-20 MED ORDER — DEXAMETHASONE SODIUM PHOSPHATE 10 MG/ML IJ SOLN
10.0000 mg | Freq: Once | INTRAMUSCULAR | Status: AC
  Administered 2017-03-20: 10 mg via INTRAMUSCULAR
  Filled 2017-03-20: qty 1

## 2017-03-20 MED ORDER — SODIUM CHLORIDE 0.9 % IV SOLN: 1000.0000 mg | Freq: Once | INTRAVENOUS | Status: DC

## 2017-03-20 MED ORDER — AZITHROMYCIN 250 MG PO TABS: 250.0000 mg | ORAL_TABLET | Freq: Every day | ORAL | 0 refills | Status: DC

## 2017-03-20 MED ORDER — BENZONATATE 100 MG PO CAPS
100.0000 mg | ORAL_CAPSULE | Freq: Once | ORAL | Status: AC
  Administered 2017-03-20: 100 mg via ORAL
  Filled 2017-03-20: qty 1

## 2017-03-20 MED ORDER — ALBUTEROL SULFATE HFA 108 (90 BASE) MCG/ACT IN AERS: 1.0000 | INHALATION_SPRAY | Freq: Four times a day (QID) | RESPIRATORY_TRACT | 0 refills | Status: DC | PRN

## 2017-03-20 MED ORDER — ALBUTEROL SULFATE (2.5 MG/3ML) 0.083% IN NEBU
5.0000 mg | INHALATION_SOLUTION | Freq: Once | RESPIRATORY_TRACT | Status: AC
  Administered 2017-03-20: 5 mg via RESPIRATORY_TRACT
  Filled 2017-03-20: qty 6

## 2017-03-20 NOTE — Discharge Instructions (Signed)
Take the prescribed medication as directed.  Try to rest and drink fluids. Follow-up with your primary care doctor if any ongoing issues. Return to the ED for new or worsening symptoms.

## 2017-03-20 NOTE — ED Notes (Signed)
Pt reports seeing her PCP and being diagnosed with an upper respiratory infection. Pt reports no relief from same.

## 2017-03-20 NOTE — ED Notes (Signed)
Pt ambulated to the bathroom w/o any difficulty

## 2017-03-20 NOTE — ED Notes (Signed)
Pt verbalized understanding of discharge instructions and prescriptions 

## 2017-03-20 NOTE — ED Provider Notes (Signed)
[facility] EMERGENCY DEPARTMENT Provider Note   CSN: [id number] Arrival date & time: 03/19/17  2040     History   Chief Complaint Chief Complaint  Patient presents with  . URI  . Shortness of Breath    HPI Carmen [name] is a 36 y.o. female.  The history is provided by the patient and medical records.  URI   Associated symptoms include cough.  Shortness of Breath  Associated symptoms include cough.     36 year old female with history of anemia, uterine fibroids, headaches, herpes, obesity, presenting to the ED for cough, shortness of breath, and chest tightness.  Reports this is been ongoing for about 2-1/2 weeks now.  States she was seen by her PCP on 03/04/2017 and given a shot of steroids, sent home with prednisone prescription as well as albuterol inhaler.  States she has not had any improvement.  She was also given a prescription for azithromycin, however she did not fill it.  She does report subjective fever and chills at home.  She reports repetitive dry cough making it difficult for her to sleep at night.  States her chest feels tight like she is not "getting a deep breath".  She denies history of asthma or other respiratory issues.  She has not had any new sick contacts.  She has not been taking any other medications at home such as cough suppressant, lozenges, etc. she has no known cardiac history.  She is not a smoker.  Past Medical History:  Diagnosis Date  . Anemia   . Fibroids    Uterine unspecified  . Headache(784.0)   . Heart murmur   . Herpes   . History of dysmenorrhea   . History of migraine headaches    Seen By Dr. Venia Minks  . Hypertension   . Irregular periods/menstrual cycles 10/04/2009  . Migraine 07/11/2014  . Obesity     Patient Active Problem List   Diagnosis Date Noted  . Somnolence, daytime 11/30/2014  . Migraine 07/11/2014  . Anemia 06/27/2013  . Vaginitis and vulvovaginitis, unspecified 06/27/2013  .  Obesity 03/09/2013  . Genital herpes, unspecified 09/06/2012  . Dysmenorrhea 09/06/2012  . Excessive or frequent menstruation 09/06/2012  . Leiomyoma of uterus, unspecified 09/06/2012    Past Surgical History:  Procedure Laterality Date  . CESAREAN SECTION      x 2  . WISDOM TOOTH EXTRACTION      OB History    Gravida Para Term Preterm AB Living   5 2 2  0 3 2   SAB TAB Ectopic Multiple Live Births   3 0 0 0         Home Medications    Prior to Admission medications   Medication Sig Start Date End Date Taking? Authorizing Provider  albuterol (PROAIR HFA) 108 (90 Base) MCG/ACT inhaler Inhale 2 puffs into the lungs every 6 (six) hours as needed for wheezing or shortness of breath.   Yes [provider]  diltiazem (CARDIZEM CD) 120 MG 24 hr capsule Take 120 mg by mouth daily. 03/11/17  Yes [provider]  hydrochlorothiazide (HYDRODIURIL) 25 MG tablet Take 25 mg by mouth daily. 03/11/17  Yes [provider]  Ibuprofen (ADVIL MIGRAINE) 200 MG CAPS Take 400 mg by mouth daily as needed (migraine).   Yes [provider]  levonorgestrel (MIRENA) 20 MCG/24HR IUD 1 each by Intrauterine route once. Implanted December 2014   Yes [provider]  metroNIDAZOLE (FLAGYL) 500 MG tablet  Take 500 mg by mouth 2 (two) times daily. 7 day course filled 03/12/17 03/12/17  Yes [provider]  Multiple Vitamin (MULTIVITAMIN WITH MINERALS) TABS tablet Take 1 tablet by mouth daily.   Yes [provider]  nystatin-triamcinolone ointment (MYCOLOG) Apply 1 application topically daily as needed (itching/rash).  03/12/17  Yes [provider]  topiramate (TOPAMAX) 25 MG tablet Take 3 tablets (75 mg total) by mouth daily. Patient taking differently: Take 50 mg by mouth at bedtime.  11/30/14  Yes Dennie Bible, NP  valACYclovir (VALTREX) 500 MG tablet TAKE 1 TABLET (500 MG TOTAL) BY MOUTH 2 (TWO) TIMES DAILY. Patient taking  differently: Take 500 mg by mouth See admin instructions. Take 1 tablet (500 mg) by mouth twice daily for 5 days as needed for outbreaks 03/17/17  Yes Shelly Bombard, MD  azithromycin (ZITHROMAX) 250 MG tablet Take 250-500 mg by mouth See admin instructions. Prescribed 03/04/17 - take 2 tablets (500 mg) by mouth 1st day, then take 1 tablet (250 mg) daily on days 2-5 03/04/17   [provider]  clotrimazole-betamethasone (LOTRISONE) cream APPLY 1 APPLICATION TOPICALLY 2 TIMES DAILY. DO NOT USE MORE THAN 14 DAYS. Patient not taking: Reported on 03/19/2017 11/25/16   Shelly Bombard, MD  ibuprofen (ADVIL,MOTRIN) 800 MG tablet Take 1 tablet (800 mg total) by mouth every 8 (eight) hours as needed. Patient not taking: Reported on 03/19/2017 07/12/13   Allen Norris, MD  SUMAtriptan (IMITREX) 100 MG tablet Take 1 tablet (100 mg total) by mouth once as needed for migraine. Patient not taking: Reported on 03/19/2017 07/11/14   Kathrynn Ducking, MD    Family History Family History  Problem Relation Age of Onset  . Breast cancer Mother        maternal aunt  . Hypertension Mother   . Hypertension Brother   . Hypertension Father   . Diabetes Unknown   . Heart disease Unknown        maternal grandparents  . Hypercholesterolemia Unknown   . Gout Unknown   . Congestive Heart Failure Unknown   . Alzheimer's disease Unknown   . Heart attack Unknown   . Ovarian cancer Unknown        Aunt  . Stroke Maternal Grandmother   . Kidney disease Maternal Grandmother   . Diabetes Paternal Grandmother   . Breast cancer Maternal Aunt   . Migraines Neg Hx     Social History Social History  Substance Use Topics  . Smoking status: Never Smoker  . Smokeless tobacco: Never Used  . Alcohol use No     Allergies   Patient has no known allergies.   Review of Systems Review of Systems  Respiratory: Positive for cough, chest tightness and shortness of breath.   All other systems reviewed and are  negative.    Physical Exam Updated Vital Signs BP (!) 142/82   Pulse 94   Temp 99.5 F (37.5 C) (Oral)   Resp 15   Ht 5\' 6"  (1.676 m)   Wt 120.2 kg (265 lb)   LMP 03/01/2017 (Exact Date)   SpO2 97%   BMI 42.77 kg/m   Physical Exam  Constitutional: She is oriented to person, place, and time. She appears well-developed and well-nourished.  HENT:  Head: Normocephalic and atraumatic.  Right Ear: Tympanic membrane and ear canal normal.  Left Ear: Tympanic membrane and ear canal normal.  Nose: Nose normal.  Mouth/Throat: Uvula is midline, oropharynx is clear and  moist and mucous membranes are normal.  Eyes: Pupils are equal, round, and reactive to light. Conjunctivae and EOM are normal.  Neck: Normal range of motion.  Cardiovascular: Normal rate, regular rhythm and normal heart sounds.   Pulmonary/Chest: Effort normal and breath sounds normal.  Repetitive dry coughing during exam, diminished breath sounds but no significant wheezing, O2 sats WNL  Abdominal: Soft. Bowel sounds are normal.  Musculoskeletal: Normal range of motion.  Neurological: She is alert and oriented to person, place, and time.  Skin: Skin is warm and dry.  Psychiatric: She has a normal mood and affect.  Nursing note and vitals reviewed.    ED Treatments / Results  Labs (all labs ordered are listed, but only abnormal results are displayed) Labs Reviewed - No data to display  EKG  EKG Interpretation None       Radiology Dg Chest 2 View  Result Date: 03/19/2017 CLINICAL DATA:  Cough and shortness of breath. Upper respiratory infection. EXAM: CHEST  2 VIEW COMPARISON:  None. FINDINGS: The cardiomediastinal contours are normal. Central bronchial wall thickening. Subsegmental atelectasis at the left lung base. Pulmonary vasculature is normal. No consolidation, pleural effusion, or pneumothorax. No acute osseous abnormalities are seen. IMPRESSION: Bronchial thickening without pneumonia. Electronically  Signed   By: Jeb Levering M.D.   On: 03/19/2017 21:41    Procedures Procedures (including critical care time)  Medications Ordered in ED Medications  albuterol (PROVENTIL) (2.5 MG/3ML) 0.083% nebulizer solution 5 mg (5 mg Nebulization Given 03/19/17 2052)  acetaminophen (TYLENOL) 325 MG tablet (650 mg  Given 03/19/17 2054)  dexamethasone (DECADRON) injection 10 mg (10 mg Intramuscular Given 03/20/17 0239)  albuterol (PROVENTIL) (2.5 MG/3ML) 0.083% nebulizer solution 5 mg (5 mg Nebulization Given 03/20/17 0236)  ipratropium (ATROVENT) nebulizer solution 0.5 mg (0.5 mg Nebulization Given 03/20/17 0236)  benzonatate (TESSALON) capsule 100 mg (100 mg Oral Given 03/20/17 0235)     Initial Impression / Assessment and Plan / ED Course  I have reviewed the triage vital signs and the nursing notes.  Pertinent labs & imaging results that were available during my care of the patient were reviewed by me and considered in my medical decision making (see chart for details).  36 year old female here with cough and shortness of breath as well as chest tightness.  Reports she has had these symptoms since 03/01/2017.  She was treated for bronchitis with steroids and albuterol, never filled the azithromycin prescription she was given.  She is afebrile and nontoxic in appearance here.  Breath sounds diminished without significant wheezing/rhonchi.  CXR consistent with bronchitic changes.  EKG non-ischemic.  Low suspicion for ACS.  Patient treated here with decadron, nebs, and tessalon and reports improvement.  VSS.  Feel she is appropriate for outpatient management.  Will have her start azithromycin, tessalon, continue inhaler PRN.  Follow-up with PCP.  Discussed plan with patient, he/she acknowledged understanding and agreed with plan of care.  Return precautions given for new or worsening symptoms.  Final Clinical Impressions(s) / ED Diagnoses   Final diagnoses:  Bronchitis    New Prescriptions Discharge  Medication List as of 03/20/2017  3:21 AM    START taking these medications   Details  benzonatate (TESSALON) 100 MG capsule Take 1 capsule (100 mg total) by mouth every 8 (eight) hours., Starting Sat 03/20/2017, Print         Larene Pickett, PA-C 03/20/17 0411    Orpah Greek, MD 03/20/17 [phone]

## 2017-12-08 ENCOUNTER — Other Ambulatory Visit: Payer: Self-pay | Admitting: Obstetrics

## 2017-12-08 DIAGNOSIS — A6000 Herpesviral infection of urogenital system, unspecified: Secondary | ICD-10-CM

## 2018-05-17 ENCOUNTER — Ambulatory Visit
Admission: EM | Admit: 2018-05-17 | Discharge: 2018-05-17 | Disposition: A | Discharge status: Home / Self Care | Payer: BC Managed Care – PPO | Attending: Physician Assistant | Admitting: Physician Assistant

## 2018-05-17 DIAGNOSIS — H6983 Other specified disorders of Eustachian tube, bilateral: Secondary | ICD-10-CM

## 2018-05-17 DIAGNOSIS — J3489 Other specified disorders of nose and nasal sinuses: Secondary | ICD-10-CM | POA: Diagnosis not present

## 2018-05-17 DIAGNOSIS — H6121 Impacted cerumen, right ear: Secondary | ICD-10-CM | POA: Diagnosis not present

## 2018-05-17 DIAGNOSIS — M545 Low back pain, unspecified: Secondary | ICD-10-CM

## 2018-05-17 MED ORDER — MELOXICAM 7.5 MG PO TABS: 7.5000 mg | ORAL_TABLET | Freq: Every day | ORAL | 0 refills | Status: DC

## 2018-05-17 MED ORDER — FLUTICASONE PROPIONATE 50 MCG/ACT NA SUSP: 2.0000 | Freq: Every day | NASAL | 0 refills | Status: DC

## 2018-05-17 MED ORDER — IPRATROPIUM BROMIDE 0.06 % NA SOLN: 2.0000 | Freq: Four times a day (QID) | NASAL | 12 refills | Status: DC

## 2018-05-17 NOTE — ED Provider Notes (Signed)
[facility]    CSN: [id number] Arrival date & time: 05/17/18  1532     History   Chief Complaint Chief Complaint  Patient presents with  . Headache    HPI Carmen [name] is a 37 y.o. female.   37 year old female comes in for 1 week history of right sided back pain and URI symptoms. States first started with back pain, woke up with the pain and denies injury/trauma. Pain is intermittent, sore/aching in sensation, worse with movement. Denies urinary frequency, dysuria, hematuria. Denies fever, chills, night sweats. Denies abdominal pain. Mild nausea without vomiting. She then started having rhinorrhea, nasal congestion, frontal pressure, right ear fullness. States tried to use otc ear wax removal with some relief. Also took advil for back pain with mild relief. Never smoker. LMP 12/2017 due to IUD placement.     Past Medical History:  Diagnosis Date  . Anemia   . Fibroids    Uterine unspecified  . Headache(784.0)   . Heart murmur   . Herpes   . History of dysmenorrhea   . History of migraine headaches    Seen By Dr. Venia Minks  . Hypertension   . Irregular periods/menstrual cycles 10/04/2009  . Migraine 07/11/2014  . Obesity     Patient Active Problem List   Diagnosis Date Noted  . Somnolence, daytime 11/30/2014  . Migraine 07/11/2014  . Anemia 06/27/2013  . Vaginitis and vulvovaginitis, unspecified 06/27/2013  . Obesity 03/09/2013  . Genital herpes, unspecified 09/06/2012  . Dysmenorrhea 09/06/2012  . Excessive or frequent menstruation 09/06/2012  . Leiomyoma of uterus, unspecified 09/06/2012    Past Surgical History:  Procedure Laterality Date  . CESAREAN SECTION      x 2  . WISDOM TOOTH EXTRACTION      OB History    Gravida  5   Para  2   Term  2   Preterm  0   AB  3   Living  2     SAB  3   TAB  0   Ectopic  0   Multiple  0   Live Births               Home Medications    Prior to Admission medications    Medication Sig Start Date End Date Taking? Authorizing Provider  albuterol (PROVENTIL HFA;VENTOLIN HFA) 108 (90 Base) MCG/ACT inhaler Inhale 1-2 puffs into the lungs every 6 (six) hours as needed for wheezing. 03/20/17   Larene Pickett, PA-C  clotrimazole-betamethasone (LOTRISONE) cream APPLY 1 APPLICATION TOPICALLY 2 TIMES DAILY. DO NOT USE MORE THAN 14 DAYS. Patient not taking: Reported on 03/19/2017 11/25/16   Shelly Bombard, MD  fluticasone [facility]) 50 MCG/ACT nasal spray Place 2 sprays into both nostrils daily. 05/17/18   Tasia Catchings, Amy V, PA-C  Ibuprofen (ADVIL MIGRAINE) 200 MG CAPS Take 400 mg by mouth daily as needed (migraine).    [provider]  ibuprofen (ADVIL,MOTRIN) 800 MG tablet Take 1 tablet (800 mg total) by mouth every 8 (eight) hours as needed. Patient not taking: Reported on 03/19/2017 07/12/13   Allen Norris, MD  ipratropium (ATROVENT) 0.06 % nasal spray Place 2 sprays into both nostrils 4 (four) times daily. 05/17/18   Ok Edwards, PA-C  levonorgestrel (MIRENA) 20 MCG/24HR IUD 1 each by Intrauterine route once. Implanted December 2014    [provider]  meloxicam (MOBIC) 7.5 MG tablet Take 1 tablet (7.5 mg total) by mouth  daily. 05/17/18   Ok Edwards, PA-C  metroNIDAZOLE (FLAGYL) 500 MG tablet Take 500 mg by mouth 2 (two) times daily. 7 day course filled 03/12/17 03/12/17   [provider]  Multiple Vitamin (MULTIVITAMIN WITH MINERALS) TABS tablet Take 1 tablet by mouth daily.    [provider]  SUMAtriptan (IMITREX) 100 MG tablet Take 1 tablet (100 mg total) by mouth once as needed for migraine. Patient not taking: Reported on 03/19/2017 07/11/14   Kathrynn Ducking, MD  topiramate (TOPAMAX) 25 MG tablet Take 3 tablets (75 mg total) by mouth daily. Patient taking differently: Take 50 mg by mouth at bedtime.  11/30/14   Dennie Bible, NP  valACYclovir (VALTREX) 500 MG tablet Take 1 tablet (500 mg total) by mouth See admin instructions.  Take 1 tablet (500 mg) by mouth twice daily for 5 days as needed for outbreaks 12/09/17   Shelly Bombard, MD    Family History Family History  Problem Relation Age of Onset  . Breast cancer Mother        maternal aunt  . Hypertension Mother   . Hypertension Brother   . Hypertension Father   . Diabetes Other   . Heart disease Other        maternal grandparents  . Hypercholesterolemia Other   . Gout Other   . Congestive Heart Failure Other   . Alzheimer's disease Other   . Heart attack Other   . Ovarian cancer Other        Aunt  . Stroke Maternal Grandmother   . Kidney disease Maternal Grandmother   . Diabetes Paternal Grandmother   . Breast cancer Maternal Aunt   . Migraines Neg Hx     Social History Social History   Tobacco Use  . Smoking status: Never Smoker  . Smokeless tobacco: Never Used  Substance Use Topics  . Alcohol use: No    Alcohol/week: 0.0 standard drinks  . Drug use: No     Allergies   Patient has no known allergies.   Review of Systems Review of Systems  Reason unable to perform ROS: See HPI as above.     Physical Exam Triage Vital Signs ED Triage Vitals  Enc Vitals Group     BP 05/17/18 1542 (!) 162/105     Pulse Rate 05/17/18 1542 75     Resp 05/17/18 1542 18     Temp 05/17/18 1542 98 F (36.7 C)     Temp Source 05/17/18 1542 Oral     SpO2 05/17/18 1542 96 %     Weight --      Height --      Head Circumference --      Peak Flow --      Pain Score 05/17/18 1543 6     Pain Loc --      Pain Edu? --      Excl. in [place]? --    No data found.  Updated Vital Signs BP (!) 162/105 (BP Location: Left Arm)   Pulse 75   Temp 98 F (36.7 C) (Oral)   Resp 18   SpO2 96%   Physical Exam Constitutional:      General: She is not in acute distress.    Appearance: She is well-developed. She is not ill-appearing, toxic-appearing or diaphoretic.  HENT:     Head: Normocephalic and atraumatic.     Right Ear: Ear canal and external ear  normal.     Left Ear:  Tympanic membrane, ear canal and external ear normal. Tympanic membrane is not erythematous or bulging.     Ears:     Comments: No tenderness to palpation of bilateral tragus. Right ear canal with cerumen impaction, TM not visible.   Post ear irrigation, slightly erythematous TM, no bulging. Mid ear effusion.     Nose: Congestion present.     Right Sinus: Maxillary sinus tenderness and frontal sinus tenderness present.     Left Sinus: No maxillary sinus tenderness or frontal sinus tenderness.     Mouth/Throat:     Pharynx: Uvula midline.  Eyes:     Conjunctiva/sclera: Conjunctivae normal.     Pupils: Pupils are equal, round, and reactive to light.  Neck:     Musculoskeletal: Normal range of motion and neck supple.  Cardiovascular:     Rate and Rhythm: Normal rate and regular rhythm.     Heart sounds: Normal heart sounds. No murmur. No friction rub. No gallop.   Pulmonary:     Effort: Pulmonary effort is normal. No respiratory distress.     Breath sounds: Normal breath sounds. No stridor. No decreased breath sounds, wheezing, rhonchi or rales.  Abdominal:     General: Bowel sounds are normal.     Palpations: Abdomen is soft.     Tenderness: There is no abdominal tenderness. There is no right CVA tenderness, left CVA tenderness or guarding.  Musculoskeletal:     Comments: No tenderness to palpation of spinous processes. Tenderness to palpation of right low thoracic/upper lumbar region. Full ROM of back and hips. Strength normal and equal bilaterally. Sensation intact and equal bilaterally. Negative straight leg raise.   Lymphadenopathy:     Cervical: No cervical adenopathy.  Skin:    General: Skin is warm and dry.  Neurological:     Mental Status: She is alert and oriented to person, place, and time.  Psychiatric:        Behavior: Behavior normal.        Judgment: Judgment normal.      UC Treatments / Results  Labs (all labs ordered are listed, but only  abnormal results are displayed) Labs Reviewed - No data to display  EKG None  Radiology No results found.  Procedures Procedures (including critical care time)  Medications Ordered in UC Medications - No data to display  Initial Impression / Assessment and Plan / UC Course  I have reviewed the triage vital signs and the nursing notes.  Pertinent labs & imaging results that were available during my care of the patient were reviewed by me and considered in my medical decision making (see chart for details).    Patient with much improved symptoms after ear irrigation. Will provide nasal spray for eustachian tube dysfunction. Other symptomatic treatment discussed.  Reproducible back pain, will provide NSAIDs. Return precautions given. Patient expresses understanding and agrees to plan.  Final Clinical Impressions(s) / UC Diagnoses   Final diagnoses:  Sinus pressure  Dysfunction of both eustachian tubes  Impacted cerumen of right ear  Acute right-sided low back pain without sciatica    ED Prescriptions    Medication Sig Dispense Auth. Provider   fluticasone (FLONASE) 50 MCG/ACT nasal spray Place 2 sprays into both nostrils daily. 1 g Yu, Amy V, PA-C   ipratropium (ATROVENT) 0.06 % nasal spray Place 2 sprays into both nostrils 4 (four) times daily. 15 mL Yu, Amy V, PA-C   meloxicam (MOBIC) 7.5 MG tablet Take 1 tablet (7.5 mg total) by  mouth daily. 15 tablet Tobin Chad, [place] 05/17/18 [phone]

## 2018-05-17 NOTE — Discharge Instructions (Signed)
Start flonase, atrovent nasal spray for nasal congestion/drainage. You can use over the counter nasal saline rinse such as neti pot for nasal congestion. Keep hydrated, your urine should be clear to pale yellow in color. Tylenol/motrin for fever and pain. Monitor for any worsening of symptoms, chest pain, shortness of breath, wheezing, swelling of the throat, follow up for reevaluation.   Start Mobic. Do not take ibuprofen (motrin/advil)/ naproxen (aleve) while on mobic. This can take up to 3-4 weeks to completely resolve, but you should be feeling better each week. Follow up here or with PCP if symptoms worsen, changes for reevaluation. If experience numbness/tingling of the inner thighs, loss of bladder or bowel control, go to the emergency department for evaluation.

## 2018-05-17 NOTE — ED Triage Notes (Signed)
Pt c/o rt ear "clogged", rt lower back pain and headache x1wk. States used OTC ear wax removal with some results but no relief

## 2018-07-20 ENCOUNTER — Ambulatory Visit
Admission: EM | Admit: 2018-07-20 | Discharge: 2018-07-20 | Disposition: A | Discharge status: Home / Self Care | Payer: BC Managed Care – PPO | Attending: Physician Assistant | Admitting: Physician Assistant

## 2018-07-20 DIAGNOSIS — K580 Irritable bowel syndrome with diarrhea: Secondary | ICD-10-CM

## 2018-07-20 DIAGNOSIS — R140 Abdominal distension (gaseous): Secondary | ICD-10-CM

## 2018-07-20 DIAGNOSIS — R197 Diarrhea, unspecified: Secondary | ICD-10-CM

## 2018-07-20 DIAGNOSIS — Z8719 Personal history of other diseases of the digestive system: Secondary | ICD-10-CM

## 2018-07-20 HISTORY — DX: Irritable bowel syndrome, unspecified: K58.9

## 2018-07-20 MED ORDER — DICYCLOMINE HCL 20 MG PO TABS: 20.0000 mg | ORAL_TABLET | Freq: Two times a day (BID) | ORAL | 0 refills | Status: DC

## 2018-07-20 MED ORDER — ONDANSETRON 4 MG PO TBDP: 4.0000 mg | ORAL_TABLET | Freq: Three times a day (TID) | ORAL | 0 refills | Status: DC | PRN

## 2018-07-20 NOTE — Discharge Instructions (Signed)
Zofran for nausea and vomiting as needed. Start bentyl as directed. Keep hydrated, you urine should be clear to pale yellow in color. Bland diet, advance as tolerated. Monitor for any worsening of symptoms, nausea or vomiting not controlled by medication, worsening abdominal pain, fever, go to the emergency department for further evaluation needed.

## 2018-07-20 NOTE — ED Triage Notes (Signed)
Pt states hx of IBS, no diarrhea but having BMs after eating, c/o bloating with a dual pain x1wk

## 2018-07-20 NOTE — ED Provider Notes (Signed)
[facility]    CSN: [id number] Arrival date & time: 07/20/18  1422     History   Chief Complaint Chief Complaint  Patient presents with  . Abdominal Pain    HPI Carmen [name] is a 38 y.o. female.   38 year old female with history of fibroids, IBS, comes in for 1 week history of diarrhea, abdominal pain, nausea. States it feels like an IBS flare up. She has small frequent loose stools with increase flatus. She feels a dull aching pain to the abdomen, that can be epigastric, suprapubic, or LLQ. Has intermittent nausea without vomiting. She has continued to tolerate oral intake. She denies fever, chills, night sweats. She denies URI symptoms such as cough, congestion, sore throat. States last episode about 1 year ago. She usually takes imodium for the symptoms, but has not taken any recently. History of C-section x 2.      Past Medical History:  Diagnosis Date  . Anemia   . Fibroids    Uterine unspecified  . Headache(784.0)   . Heart murmur   . Herpes   . History of dysmenorrhea   . History of migraine headaches    Seen By Dr. Venia Minks  . Hypertension   . IBS (irritable bowel syndrome)   . Irregular periods/menstrual cycles 10/04/2009  . Migraine 07/11/2014  . Obesity     Patient Active Problem List   Diagnosis Date Noted  . Somnolence, daytime 11/30/2014  . Migraine 07/11/2014  . Anemia 06/27/2013  . Vaginitis and vulvovaginitis, unspecified 06/27/2013  . Obesity 03/09/2013  . Genital herpes, unspecified 09/06/2012  . Dysmenorrhea 09/06/2012  . Excessive or frequent menstruation 09/06/2012  . Leiomyoma of uterus, unspecified 09/06/2012    Past Surgical History:  Procedure Laterality Date  . CESAREAN SECTION      x 2  . WISDOM TOOTH EXTRACTION      OB History    Gravida  5   Para  2   Term  2   Preterm  0   AB  3   Living  2     SAB  3   TAB  0   Ectopic  0   Multiple  0   Live Births               Home  Medications    Prior to Admission medications   Medication Sig Start Date End Date Taking? Authorizing Provider  albuterol (PROVENTIL HFA;VENTOLIN HFA) 108 (90 Base) MCG/ACT inhaler Inhale 1-2 puffs into the lungs every 6 (six) hours as needed for wheezing. 03/20/17   Larene Pickett, PA-C  clotrimazole-betamethasone (LOTRISONE) cream APPLY 1 APPLICATION TOPICALLY 2 TIMES DAILY. DO NOT USE MORE THAN 14 DAYS. Patient not taking: Reported on 03/19/2017 11/25/16   Shelly Bombard, MD  dicyclomine (BENTYL) 20 MG tablet Take 1 tablet (20 mg total) by mouth 2 (two) times daily. 07/20/18   Tasia Catchings, Amy V, PA-C  fluticasone (FLONASE) 50 MCG/ACT nasal spray Place 2 sprays into both nostrils daily. 05/17/18   Tasia Catchings, Amy V, PA-C  Ibuprofen (ADVIL MIGRAINE) 200 MG CAPS Take 400 mg by mouth daily as needed (migraine).    [provider]  ibuprofen (ADVIL,MOTRIN) 800 MG tablet Take 1 tablet (800 mg total) by mouth every 8 (eight) hours as needed. Patient not taking: Reported on 03/19/2017 07/12/13   Allen Norris, MD  ipratropium (ATROVENT) 0.06 % nasal spray Place 2 sprays into both nostrils 4 (four) times daily.  05/17/18   Ok Edwards, PA-C  levonorgestrel (MIRENA) 20 MCG/24HR IUD 1 each by Intrauterine route once. Implanted December 2014    [provider]  metroNIDAZOLE (FLAGYL) 500 MG tablet Take 500 mg by mouth 2 (two) times daily. 7 day course filled 03/12/17 03/12/17   [provider]  Multiple Vitamin (MULTIVITAMIN WITH MINERALS) TABS tablet Take 1 tablet by mouth daily.    [provider]  ondansetron (ZOFRAN ODT) 4 MG disintegrating tablet Take 1 tablet (4 mg total) by mouth every 8 (eight) hours as needed for nausea or vomiting. 07/20/18   Tasia Catchings, Amy V, PA-C  SUMAtriptan (IMITREX) 100 MG tablet Take 1 tablet (100 mg total) by mouth once as needed for migraine. Patient not taking: Reported on 03/19/2017 07/11/14   Kathrynn Ducking, MD  topiramate (TOPAMAX) 25 MG tablet Take 3 tablets  (75 mg total) by mouth daily. Patient taking differently: Take 50 mg by mouth at bedtime.  11/30/14   Dennie Bible, NP  valACYclovir (VALTREX) 500 MG tablet Take 1 tablet (500 mg total) by mouth See admin instructions. Take 1 tablet (500 mg) by mouth twice daily for 5 days as needed for outbreaks 12/09/17   Shelly Bombard, MD    Family History Family History  Problem Relation Age of Onset  . Breast cancer Mother        maternal aunt  . Hypertension Mother   . Hypertension Brother   . Hypertension Father   . Diabetes Other   . Heart disease Other        maternal grandparents  . Hypercholesterolemia Other   . Gout Other   . Congestive Heart Failure Other   . Alzheimer's disease Other   . Heart attack Other   . Ovarian cancer Other        Aunt  . Stroke Maternal Grandmother   . Kidney disease Maternal Grandmother   . Diabetes Paternal Grandmother   . Breast cancer Maternal Aunt   . Migraines Neg Hx     Social History Social History   Tobacco Use  . Smoking status: Never Smoker  . Smokeless tobacco: Never Used  Substance Use Topics  . Alcohol use: No    Alcohol/week: 0.0 standard drinks  . Drug use: No     Allergies   Patient has no known allergies.   Review of Systems Review of Systems  Reason unable to perform ROS: See HPI as above.     Physical Exam Triage Vital Signs ED Triage Vitals  Enc Vitals Group     BP 07/20/18 1430 (!) 161/94     Pulse Rate 07/20/18 1430 75     Resp 07/20/18 1430 18     Temp 07/20/18 1430 98.4 F (36.9 C)     Temp Source 07/20/18 1430 Oral     SpO2 07/20/18 1430 98 %     Weight --      Height --      Head Circumference --      Peak Flow --      Pain Score 07/20/18 1432 5     Pain Loc --      Pain Edu? --      Excl. in [place]? --    No data found.  Updated Vital Signs BP (!) 161/94 (BP Location: Left Arm)   Pulse 75   Temp 98.4 F (36.9 C) (Oral)   Resp 18   SpO2 98%   Physical Exam Constitutional:  General: She is not in acute distress.    Appearance: She is well-developed. She is not ill-appearing, toxic-appearing or diaphoretic.  HENT:     Head: Normocephalic and atraumatic.  Eyes:     Conjunctiva/sclera: Conjunctivae normal.     Pupils: Pupils are equal, round, and reactive to light.  Cardiovascular:     Rate and Rhythm: Normal rate and regular rhythm.     Heart sounds: Normal heart sounds. No murmur. No friction rub. No gallop.   Pulmonary:     Effort: Pulmonary effort is normal.     Breath sounds: Normal breath sounds. No wheezing or rales.  Abdominal:     General: Bowel sounds are normal.     Palpations: Abdomen is soft.     Tenderness: There is no abdominal tenderness. There is no right CVA tenderness, left CVA tenderness, guarding or rebound.  Skin:    General: Skin is warm and dry.  Neurological:     Mental Status: She is alert and oriented to person, place, and time.  Psychiatric:        Behavior: Behavior normal.        Judgment: Judgment normal.      UC Treatments / Results  Labs (all labs ordered are listed, but only abnormal results are displayed) Labs Reviewed - No data to display  EKG None  Radiology No results found.  Procedures Procedures (including critical care time)  Medications Ordered in UC Medications - No data to display  Initial Impression / Assessment and Plan / UC Course  I have reviewed the triage vital signs and the nursing notes.  Pertinent labs & imaging results that were available during my care of the patient were reviewed by me and considered in my medical decision making (see chart for details).    Exam reassuring, nontender abdomen. Will provide bentyl for IBS-D at this time. zofran as needed. Push fluids. Return precautions given. Otherwise, follow up with PCP as scheduled for reevaluation and management needed. Patient expresses understanding and agrees to plan.  Final Clinical Impressions(s) / UC Diagnoses   Final  diagnoses:  Diarrhea, unspecified type  Abdominal bloating  History of IBS    ED Prescriptions    Medication Sig Dispense Auth. Provider   dicyclomine (BENTYL) 20 MG tablet Take 1 tablet (20 mg total) by mouth 2 (two) times daily. 20 tablet Yu, Amy V, PA-C   ondansetron (ZOFRAN ODT) 4 MG disintegrating tablet Take 1 tablet (4 mg total) by mouth every 8 (eight) hours as needed for nausea or vomiting. 15 tablet Tobin Chad, [place] 07/20/18 1456

## 2018-07-28 ENCOUNTER — Encounter: Payer: Self-pay | Admitting: Family Medicine

## 2018-08-04 ENCOUNTER — Ambulatory Visit (INDEPENDENT_AMBULATORY_CARE_PROVIDER_SITE_OTHER): Payer: BC Managed Care – PPO | Admitting: Family Medicine

## 2018-08-04 ENCOUNTER — Other Ambulatory Visit: Payer: Self-pay

## 2018-08-04 ENCOUNTER — Encounter: Payer: Self-pay | Admitting: Family Medicine

## 2018-08-04 VITALS — BP 138/88 | HR 80 | Temp 98.9°F | Resp 16 | Wt 278.0 lb

## 2018-08-04 DIAGNOSIS — J019 Acute sinusitis, unspecified: Secondary | ICD-10-CM | POA: Diagnosis not present

## 2018-08-04 DIAGNOSIS — I10 Essential (primary) hypertension: Secondary | ICD-10-CM

## 2018-08-04 DIAGNOSIS — Z6841 Body Mass Index (BMI) 40.0 and over, adult: Secondary | ICD-10-CM | POA: Diagnosis not present

## 2018-08-04 MED ORDER — AMOXICILLIN-POT CLAVULANATE 875-125 MG PO TABS: 1.0000 | ORAL_TABLET | Freq: Two times a day (BID) | ORAL | 0 refills | Status: DC

## 2018-08-04 MED ORDER — CETIRIZINE HCL 10 MG PO TABS: 10.0000 mg | ORAL_TABLET | Freq: Every day | ORAL | 11 refills | Status: AC

## 2018-08-04 MED ORDER — DILTIAZEM HCL ER COATED BEADS 120 MG PO CP24: 120.0000 mg | ORAL_CAPSULE | Freq: Every day | ORAL | 3 refills | Status: DC

## 2018-08-04 MED ORDER — HYDROCHLOROTHIAZIDE 25 MG PO TABS: 25.0000 mg | ORAL_TABLET | Freq: Every day | ORAL | 3 refills | Status: DC

## 2018-08-04 NOTE — Progress Notes (Signed)
Carmen [name], is a 38 y.o. female  IRC:[id number]  PZW:[id number]  DOB - 11/10/1980  CC:  Chief Complaint  Patient presents with  . Establish Care       HPI: Carmen [name] is a 38 y.o. female is here today to establish care.   Carmen [name] has Genital herpes, unspecified; Leiomyoma of uterus, unspecified; Obesity; Anemia; Vaginitis and vulvovaginitis, unspecified; Migraine; and Somnolence, daytime on their problem list.   Hypertension Off medications for almost 1 year. History of hypertension in her 66's. Off blood pressure medication since March or April of 2019. No recent monitoring of blood pressure. Blood pressure has been elevated in the past at recent Urgent care visits x 2. BP previously managed with Cardizem and Hydrochlorothiazide. She denies chest pain, lower extremity edema, shortness of breath, dizziness, or weakness.   Weight gain Concern for weight gain. Reports changes in diet such as increasing vegetables.  She skips breakfast and doesn't routinely eat snacks. She doesn't engage in any routine physical activity. Body mass index is 44.87 kg/m. she would like to make efforts to improve overall health.   Nasal congestion Sinus issues for 3 weeks. Nasal congestion and sneezing. Pressure behind the eyes and facial tenderness. Cough occasionally, which she attributes to PND. She intermittently experiences nasal drainage but experiences mostly nasal congestion. She has not attempted relief with any OTC medication.   Current medications: Current Outpatient Medications:  .  dicyclomine (BENTYL) 20 MG tablet, Take 1 tablet (20 mg total) by mouth 2 (two) times daily., Disp: 20 tablet, Rfl: 0 .  ipratropium (ATROVENT) 0.06 % nasal spray, Place 2 sprays into both nostrils 4 (four) times daily., Disp: 15 mL, Rfl: 12 .  levonorgestrel (MIRENA) 20 MCG/24HR IUD, 1 each by Intrauterine route once. Implanted December 2014, Disp: , Rfl:  .  Multiple Vitamin (MULTIVITAMIN WITH MINERALS)  TABS tablet, Take 1 tablet by mouth daily., Disp: , Rfl:  .  ondansetron (ZOFRAN ODT) 4 MG disintegrating tablet, Take 1 tablet (4 mg total) by mouth every 8 (eight) hours as needed for nausea or vomiting., Disp: 15 tablet, Rfl: 0 .  valACYclovir (VALTREX) 500 MG tablet, Take 1 tablet (500 mg total) by mouth See admin instructions. Take 1 tablet (500 mg) by mouth twice daily for 5 days as needed for outbreaks, Disp: 30 tablet, Rfl: 11   Pertinent family medical history: family history includes Alzheimer's disease in an other family member; Breast cancer in her maternal aunt, maternal aunt, and mother; Congestive Heart Failure in an other family member; Diabetes in her paternal grandmother and another family member; Gout in an other family member; Heart attack in an other family member; Heart disease in her maternal grandfather and maternal grandmother; Hypercholesterolemia in an other family member; Hyperlipidemia in her father and mother; Hypertension in her brother, father, maternal grandfather, and mother; Kidney disease in her maternal grandmother; Ovarian cancer in an other family member; Stroke in her maternal grandmother.   No Known Allergies  Social History   Socioeconomic History  . Marital status: Single    Spouse name: Not on file  . Number of children: 2  . Years of education: 56  . Highest education level: Not on file  Occupational History  . Occupation: Metallurgist- elementary   Social Needs  . Financial resource strain: Not on file  . Food insecurity:    Worry: Not on file    Inability: Not on file  . Transportation needs:    Medical: Not on file  Non-medical: Not on file  Tobacco Use  . Smoking status: Never Smoker  . Smokeless tobacco: Never Used  Substance and Sexual Activity  . Alcohol use: No    Alcohol/week: 0.0 standard drinks  . Drug use: No  . Sexual activity: Not Currently    Birth control/protection: I.U.D.  Lifestyle  . Physical activity:    Days per  week: Not on file    Minutes per session: Not on file  . Stress: Not on file  Relationships  . Social connections:    Talks on phone: Not on file    Gets together: Not on file    Attends religious service: Not on file    Active member of club or organization: Not on file    Attends meetings of clubs or organizations: Not on file    Relationship status: Not on file  . Intimate partner violence:    Fear of current or ex partner: Not on file    Emotionally abused: Not on file    Physically abused: Not on file    Forced sexual activity: Not on file  Other Topics Concern  . Not on file  Social History Narrative   Lives at home with 2 kids.   Patient is right handed.   Patient drinks 4 sodas/week    Review of Systems: Pertinent negatives listed in HPI Objective:   Vitals:   08/04/18 1531  BP: 138/88  Pulse: 80  Resp: 16  Temp: 98.9 F (37.2 C)  SpO2: 96%    BP Readings from Last 3 Encounters:  08/04/18 138/88  07/20/18 (!) 161/94  05/17/18 (!) 162/105    Filed Weights   08/04/18 1531  Weight: 278 lb (126.1 kg)      Physical Exam: Constitutional: Patient appears well-developed and well-nourished. No distress. HENT: Normocephalic, atraumatic, External right and left ear normal. Eyes: Conjunctivae and EOM are normal. PERRLA, no scleral icterus. Neck: Normal ROM. Neck supple. No JVD. No tracheal deviation. No thyromegaly. CVS: RRR, S1/S2 +, no murmurs, no gallops, no carotid bruit.  Pulmonary: Effort and breath sounds normal, no stridor, rhonchi, wheezes, rales.  Abdominal: Soft. BS +, no distension, tenderness, rebound or guarding.  Musculoskeletal: Normal range of motion. No edema and no tenderness.  Neuro: Alert. Normal muscle tone coordination. Normal gait.  Skin: Skin is warm and dry. No rash noted. Not diaphoretic. No erythema. No pallor. Psychiatric: Normal mood and affect. Behavior, judgment, thought content normal.  Lab Results  Component Value Date    WBC 7.1 06/27/2013   HGB 10.8 (L) 06/27/2013   HCT 33.3 (L) 06/27/2013   MCV 74.5 (L) 06/27/2013   PLT 412 (H) 06/27/2013   Lab Results  Component Value Date   CREATININE 0.69 03/09/2013   BUN 8 03/09/2013   NA 138 03/09/2013   K 4.3 03/09/2013   CL 105 03/09/2013   CO2 25 03/09/2013    Lab Results  Component Value Date   HGBA1C 5.9 (H) 12/27/2012       Component Value Date/Time   CHOL 121 12/27/2012 1106   TRIG 98 12/27/2012 1106   HDL 43 12/27/2012 1106        Assessment and plan:  1. Essential hypertension, controled today, mostly accelerated prior readings per EMR Resume Cardizem and HCtZ We have discussed target BP range and blood pressure goal. I have advised patient to check BP regularly and to call us back or report to clinic if the numbers are consistently higher than 140/90. We  discussed the importance of compliance with medical therapy and DASH diet recommended, consequences of uncontrolled hypertension discussed.  - Comprehensive metabolic panel  2. Acute sinusitis, recurrence not specified, unspecified location -Augmentin 1 tablet twice daily x 10 days -Trial Atrovent and Cetirizine   3. Severe obesity (BMI >= 40) (HCC) Encouraged efforts to reduce weight include engaging in physical activity as tolerated with goal of 150 minutes per week. Improve dietary choices and eat a meal regimen consistent with a Mediterranean or DASH diet. Reduce simple carbohydrates. Do not skip meals and eat healthy snacks throughout the day to avoid over-eating at dinner. Set a goal weight loss that is achievable for you. Checking: - Hemoglobin A1c - TSH  Return for 6 week Nurse visit and lab appt and 4 month provider .   The patient was given clear instructions to go to ER or return to medical center if symptoms don't improve, worsen or new problems develop. The patient verbalized understanding. The patient was advised  to call and obtain lab results if they haven't heard  anything from out office within 7-10 business days.  Molli Barrows, FNP [facility] [address], [place] [address] [phone]: [phone]    This note has been created with Dragon speech recognition software and Engineer, materials. Any transcriptional errors are unintentional.

## 2018-08-04 NOTE — Progress Notes (Deleted)
Nasal congestion and headache  Has concerns about BP and weight gain  2 spots were found on her Be Sure Cancer screening.  (Breast Ca)

## 2018-08-04 NOTE — Patient Instructions (Signed)
Thank you for choosing Primary Care at [facility] for your medical home!    Carmen [name] was seen by Molli Barrows, FNP today.   Carmen [name]'s primary care doctor is Scot Jun, FNP.   For the best care possible,  you should try to see Molli Barrows, FNP-C  whenever you come to clinic.   We look forward to seeing you again soon!  If you have any questions about your visit today,  please call us at [phone]  Or feel free to reach your provider via [place].      Calorie Counting for Weight Loss Calories are units of energy. Your body needs a certain amount of calories from food to keep you going throughout the day. When you eat more calories than your body needs, your body stores the extra calories as fat. When you eat fewer calories than your body needs, your body burns fat to get the energy it needs. Calorie counting means keeping track of how many calories you eat and drink each day. Calorie counting can be helpful if you need to lose weight. If you make sure to eat fewer calories than your body needs, you should lose weight. Ask your health care provider what a healthy weight is for you. For calorie counting to work, you will need to eat the right number of calories in a day in order to lose a healthy amount of weight per week. A dietitian can help you determine how many calories you need in a day and will give you suggestions on how to reach your calorie goal.  A healthy amount of weight to lose per week is usually 1-2 lb (0.5-0.9 kg). This usually means that your daily calorie intake should be reduced by 500-750 calories.  Eating 1,200 - 1,500 calories per day can help most women lose weight.  Eating 1,500 - 1,800 calories per day can help most men lose weight. What is my plan? My goal is to have __________ calories per day. If I have this many calories per day, I should lose around __________ pounds per week. What do I need to know about calorie  counting? In order to meet your daily calorie goal, you will need to:  Find out how many calories are in each food you would like to eat. Try to do this before you eat.  Decide how much of the food you plan to eat.  Write down what you ate and how many calories it had. Doing this is called keeping a food log. To successfully lose weight, it is important to balance calorie counting with a healthy lifestyle that includes regular activity. Aim for 150 minutes of moderate exercise (such as walking) or 75 minutes of vigorous exercise (such as running) each week. Where do I find calorie information?  The number of calories in a food can be found on a Nutrition Facts label. If a food does not have a Nutrition Facts label, try to look up the calories online or ask your dietitian for help. Remember that calories are listed per serving. If you choose to have more than one serving of a food, you will have to multiply the calories per serving by the amount of servings you plan to eat. For example, the label on a package of bread might say that a serving size is 1 slice and that there are 90 calories in a serving. If you eat 1 slice, you will have eaten 90 calories. If you eat 2  slices, you will have eaten 180 calories. How do I keep a food log? Immediately after each meal, record the following information in your food log:  What you ate. Don't forget to include toppings, sauces, and other extras on the food.  How much you ate. This can be measured in cups, ounces, or number of items.  How many calories each food and drink had.  The total number of calories in the meal. Keep your food log near you, such as in a small notebook in your pocket, or use a mobile app or website. Some programs will calculate calories for you and show you how many calories you have left for the day to meet your goal. What are some calorie counting tips?   Use your calories on foods and drinks that will fill you up and not  leave you hungry: ? Some examples of foods that fill you up are nuts and nut butters, vegetables, lean proteins, and high-fiber foods like whole grains. High-fiber foods are foods with more than 5 g fiber per serving. ? Drinks such as sodas, specialty coffee drinks, alcohol, and juices have a lot of calories, yet do not fill you up.  Eat nutritious foods and avoid empty calories. Empty calories are calories you get from foods or beverages that do not have many vitamins or protein, such as candy, sweets, and soda. It is better to have a nutritious high-calorie food (such as an avocado) than a food with few nutrients (such as a bag of chips).  Know how many calories are in the foods you eat most often. This will help you calculate calorie counts faster.  Pay attention to calories in drinks. Low-calorie drinks include water and unsweetened drinks.  Pay attention to nutrition labels for "low fat" or "fat free" foods. These foods sometimes have the same amount of calories or more calories than the full fat versions. They also often have added sugar, starch, or salt, to make up for flavor that was removed with the fat.  Find a way of tracking calories that works for you. Get creative. Try different apps or programs if writing down calories does not work for you. What are some portion control tips?  Know how many calories are in a serving. This will help you know how many servings of a certain food you can have.  Use a measuring cup to measure serving sizes. You could also try weighing out portions on a kitchen scale. With time, you will be able to estimate serving sizes for some foods.  Take some time to put servings of different foods on your favorite plates, bowls, and cups so you know what a serving looks like.  Try not to eat straight from a bag or box. Doing this can lead to overeating. Put the amount you would like to eat in a cup or on a plate to make sure you are eating the right portion.   Use smaller plates, glasses, and bowls to prevent overeating.  Try not to multitask (for example, watch TV or use your computer) while eating. If it is time to eat, sit down at a table and enjoy your food. This will help you to know when you are full. It will also help you to be aware of what you are eating and how much you are eating. What are tips for following this plan? Reading food labels  Check the calorie count compared to the serving size. The serving size may be smaller than what  you are used to eating.  Check the source of the calories. Make sure the food you are eating is high in vitamins and protein and low in saturated and trans fats. Shopping  Read nutrition labels while you shop. This will help you make healthy decisions before you decide to purchase your food.  Make a grocery list and stick to it. Cooking  Try to cook your favorite foods in a healthier way. For example, try baking instead of frying.  Use low-fat dairy products. Meal planning  Use more fruits and vegetables. Half of your plate should be fruits and vegetables.  Include lean proteins like poultry and fish. How do I count calories when eating out?  Ask for smaller portion sizes.  Consider sharing an entree and sides instead of getting your own entree.  If you get your own entree, eat only half. Ask for a box at the beginning of your meal and put the rest of your entree in it so you are not tempted to eat it.  If calories are listed on the menu, choose the lower calorie options.  Choose dishes that include vegetables, fruits, whole grains, low-fat dairy products, and lean protein.  Choose items that are boiled, broiled, grilled, or steamed. Stay away from items that are buttered, battered, fried, or served with cream sauce. Items labeled "crispy" are usually fried, unless stated otherwise.  Choose water, low-fat milk, unsweetened iced tea, or other drinks without added sugar. If you want an alcoholic  beverage, choose a lower calorie option such as a glass of wine or light beer.  Ask for dressings, sauces, and syrups on the side. These are usually high in calories, so you should limit the amount you eat.  If you want a salad, choose a garden salad and ask for grilled meats. Avoid extra toppings like bacon, cheese, or fried items. Ask for the dressing on the side, or ask for olive oil and vinegar or lemon to use as dressing.  Estimate how many servings of a food you are given. For example, a serving of cooked rice is  cup or about the size of half a baseball. Knowing serving sizes will help you be aware of how much food you are eating at restaurants. The list below tells you how big or small some common portion sizes are based on everyday objects: ? 1 oz-4 stacked dice. ? 3 oz-1 deck of cards. ? 1 tsp-1 die. ? 1 Tbsp- a ping-pong ball. ? 2 Tbsp-1 ping-pong ball. ?  cup- baseball. ? 1 cup-1 baseball. Summary  Calorie counting means keeping track of how many calories you eat and drink each day. If you eat fewer calories than your body needs, you should lose weight.  A healthy amount of weight to lose per week is usually 1-2 lb (0.5-0.9 kg). This usually means reducing your daily calorie intake by 500-750 calories.  The number of calories in a food can be found on a Nutrition Facts label. If a food does not have a Nutrition Facts label, try to look up the calories online or ask your dietitian for help.  Use your calories on foods and drinks that will fill you up, and not on foods and drinks that will leave you hungry.  Use smaller plates, glasses, and bowls to prevent overeating. This information is not intended to replace advice given to you by your health care provider. Make sure you discuss any questions you have with your health care provider. Document Released: 05/04/2005  Document Revised: 01/21/2018 Document Reviewed: 04/03/2016 Elsevier Interactive Patient Education  [employer].

## 2018-08-05 LAB — COMPREHENSIVE METABOLIC PANEL
A/G RATIO: 1.1 — AB (ref 1.2–2.2)
ALT: 14 IU/L (ref 0–32)
AST: 11 IU/L (ref 0–40)
Albumin: 4 g/dL (ref 3.8–4.8)
Alkaline Phosphatase: 88 IU/L (ref 39–117)
BUN/Creatinine Ratio: 13 (ref 9–23)
BUN: 10 mg/dL (ref 6–20)
Bilirubin Total: 0.4 mg/dL (ref 0.0–1.2)
CALCIUM: 9.2 mg/dL (ref 8.7–10.2)
CO2: 24 mmol/L (ref 20–29)
CREATININE: 0.78 mg/dL (ref 0.57–1.00)
Chloride: 103 mmol/L (ref 96–106)
GFR, EST AFRICAN AMERICAN: 112 mL/min/{1.73_m2} (ref >59)
GFR, EST NON AFRICAN AMERICAN: 97 mL/min/{1.73_m2} (ref >59)
GLOBULIN, TOTAL: 3.5 g/dL (ref 1.5–4.5)
Glucose: 130 mg/dL — ABNORMAL HIGH (ref 65–99)
POTASSIUM: 3.7 mmol/L (ref 3.5–5.2)
SODIUM: 140 mmol/L (ref 134–144)
TOTAL PROTEIN: 7.5 g/dL (ref 6.0–8.5)

## 2018-08-05 LAB — HEMOGLOBIN A1C
Est. average glucose Bld gHb Est-mCnc: 123 mg/dL
Hgb A1c MFr Bld: 5.9 % — ABNORMAL HIGH (ref 4.8–5.6)

## 2018-08-05 LAB — TSH: TSH: 0.963 u[IU]/mL (ref 0.450–4.500)

## 2018-08-08 ENCOUNTER — Telehealth: Payer: Self-pay

## 2018-08-08 NOTE — Telephone Encounter (Signed)
CMA spoke to patient.  Patient was informed on lab results.  Patient understood. Patient verified DOB.

## 2018-08-08 NOTE — Progress Notes (Signed)
Carmen [name], CMA 18 minutes ago (4:18 PM)    CMA spoke to patient.  Patient was informed on lab results.  Patient understood. Patient verified DOB.    Francisco Capuchin

## 2018-08-20 ENCOUNTER — Other Ambulatory Visit: Payer: Self-pay | Admitting: Family Medicine

## 2018-08-23 ENCOUNTER — Other Ambulatory Visit: Payer: Self-pay

## 2018-08-23 ENCOUNTER — Ambulatory Visit
Admission: EM | Admit: 2018-08-23 | Discharge: 2018-08-23 | Disposition: A | Discharge status: Home / Self Care | Payer: BC Managed Care – PPO | Attending: Physician Assistant | Admitting: Physician Assistant

## 2018-08-23 DIAGNOSIS — J302 Other seasonal allergic rhinitis: Secondary | ICD-10-CM

## 2018-08-23 DIAGNOSIS — J3089 Other allergic rhinitis: Secondary | ICD-10-CM

## 2018-08-23 MED ORDER — KETOROLAC TROMETHAMINE 30 MG/ML IJ SOLN
30.0000 mg | Freq: Once | INTRAMUSCULAR | Status: AC
  Administered 2018-08-23: 30 mg via INTRAMUSCULAR

## 2018-08-23 NOTE — Discharge Instructions (Signed)
Restart atrovent and continue zyrtec. If symptoms still not improving in 1-2 weeks, give [place] a call back and I'll see if we need to add on a different allergy medicine.    As discussed, currently symptoms most likely due to allergies. If develop cold symptoms such as cough, fever, chills, body aches, please self quarantine for 7 days since symptoms started AND more than 72 hours of no fever and cough prior to leaving the house. You can call COVID hotline [phone]) or use [facility] online if you have questions, or symptoms worsens to determine where you should seek care. If experiencing shortness of breath, trouble breathing, call 911 and provide them with your current situation.

## 2018-08-23 NOTE — ED Triage Notes (Signed)
Pt triaged by provider  

## 2018-08-23 NOTE — ED Provider Notes (Signed)
[facility]    CSN: [id number] Arrival date & time: 08/23/18  1919     History   Chief Complaint Chief Complaint  Patient presents with   Sore Throat    HPI Carmen [name] is a 38 y.o. female.   38 year old female comes in for 2 day history of allergy symptoms. She has had facial pressure, nasal congestion, post nasal drip, throat itching, watery eyes, sneezing. She has had mild cough. Denies fever, chills, night sweats. Denies chest pain, shortness of breath, wheezing. She is currently experiencing a headache to the top of the head. Denies photophobia, nausea, vomiting. Was seen in 07/2018 with PCP for allergies and was on zyrtec and atrovent nasal spray with good relief. States that she stopped atrovent about 1 week ago as she did not know how long she could be on it. Has continued the zyrtec.      Past Medical History:  Diagnosis Date   Anemia    Bilateral carpal tunnel syndrome    Essential hypertension    Fibroids    Uterine unspecified   Genital herpes    Heart murmur    History of migraine headaches    Seen By Dr. Venia Minks   IBS (irritable bowel syndrome)    Migraine 07/11/2014   Morbid obesity ([place])    OSA (obstructive sleep apnea)     Patient Active Problem List   Diagnosis Date Noted   Somnolence, daytime 11/30/2014   Migraine 07/11/2014   Anemia 06/27/2013   Vaginitis and vulvovaginitis, unspecified 06/27/2013   Obesity 03/09/2013   Genital herpes, unspecified 09/06/2012   Leiomyoma of uterus, unspecified 09/06/2012    Past Surgical History:  Procedure Laterality Date   CARPAL TUNNEL RELEASE Right    CESAREAN SECTION      x 2   WISDOM TOOTH EXTRACTION      OB History    Gravida  5   Para  2   Term  2   Preterm  0   AB  3   Living  2     SAB  3   TAB  0   Ectopic  0   Multiple  0   Live Births               Home Medications    Prior to Admission medications   Medication  Sig Start Date End Date Taking? Authorizing Provider  amoxicillin-clavulanate (AUGMENTIN) 875-125 MG tablet Take 1 tablet by mouth 2 (two) times daily. 08/04/18   Scot Jun, FNP  cetirizine (ZYRTEC) 10 MG tablet Take 1 tablet (10 mg total) by mouth daily. 08/04/18   Scot Jun, FNP  dicyclomine (BENTYL) 20 MG tablet Take 1 tablet (20 mg total) by mouth 2 (two) times daily. 07/20/18   Tasia Catchings, Ernie Kasler V, PA-C  diltiazem (CARDIZEM CD) 120 MG 24 hr capsule Take 1 capsule (120 mg total) by mouth daily. 08/04/18   Scot Jun, FNP  hydrochlorothiazide (HYDRODIURIL) 25 MG tablet Take 1 tablet (25 mg total) by mouth daily. 08/04/18   Scot Jun, FNP  ipratropium (ATROVENT) 0.06 % nasal spray Place 2 sprays into both nostrils 4 (four) times daily. 05/17/18   Ok Edwards, PA-C  levonorgestrel (MIRENA) 20 MCG/24HR IUD 1 each by Intrauterine route once. Implanted December 2014    [provider]  Multiple Vitamin (MULTIVITAMIN WITH MINERALS) TABS tablet Take 1 tablet by mouth daily.    [provider]  ondansetron (  ZOFRAN ODT) 4 MG disintegrating tablet Take 1 tablet (4 mg total) by mouth every 8 (eight) hours as needed for nausea or vomiting. 07/20/18   Tasia Catchings, Hildreth Orsak V, PA-C  valACYclovir (VALTREX) 500 MG tablet Take 1 tablet (500 mg total) by mouth See admin instructions. Take 1 tablet (500 mg) by mouth twice daily for 5 days as needed for outbreaks 12/09/17   Shelly Bombard, MD    Family History Family History  Problem Relation Age of Onset   Breast cancer Mother    Hypertension Mother    Hyperlipidemia Mother    Hypertension Brother    Hypertension Father    Hyperlipidemia Father    Diabetes Other    Hypercholesterolemia Other    Gout Other    Congestive Heart Failure Other    Alzheimer's disease Other    Heart attack Other    Ovarian cancer Other        Aunt   Stroke Maternal Grandmother    Kidney disease Maternal Grandmother    Heart disease  Maternal Grandmother    Diabetes Paternal Grandmother    Breast cancer Maternal Aunt    Heart disease Maternal Grandfather    Hypertension Maternal Grandfather    Breast cancer Maternal Aunt    Migraines Neg Hx     Social History Social History   Tobacco Use   Smoking status: Never Smoker   Smokeless tobacco: Never Used  Substance Use Topics   Alcohol use: No    Alcohol/week: 0.0 standard drinks   Drug use: No     Allergies   Patient has no known allergies.   Review of Systems Review of Systems  Reason unable to perform ROS: See HPI as above.     Physical Exam Triage Vital Signs ED Triage Vitals  Enc Vitals Group     BP      Pulse      Resp      Temp      Temp src      SpO2      Weight      Height      Head Circumference      Peak Flow      Pain Score      Pain Loc      Pain Edu?      Excl. in [place]?    No data found.  Updated Vital Signs BP (!) 153/87 (BP Location: Left Arm)    Pulse 85    Temp 98.6 F (37 C) (Oral)    Resp 20    SpO2 97%   Physical Exam Constitutional:      General: She is not in acute distress.    Appearance: She is well-developed. She is not ill-appearing, toxic-appearing or diaphoretic.  HENT:     Head: Normocephalic and atraumatic.     Right Ear: Tympanic membrane, ear canal and external ear normal. Tympanic membrane is not erythematous or bulging.     Left Ear: Tympanic membrane, ear canal and external ear normal. Tympanic membrane is not erythematous or bulging.     Nose:     Right Sinus: Maxillary sinus tenderness present. No frontal sinus tenderness.     Left Sinus: Maxillary sinus tenderness present. No frontal sinus tenderness.     Mouth/Throat:     Mouth: Mucous membranes are moist.     Pharynx: Oropharynx is clear. Uvula midline.  Eyes:     Conjunctiva/sclera: Conjunctivae normal.     Pupils: Pupils are  equal, round, and reactive to light.  Neck:     Musculoskeletal: Normal range of motion and neck  supple.  Cardiovascular:     Rate and Rhythm: Normal rate and regular rhythm.     Heart sounds: Normal heart sounds. No murmur. No friction rub. No gallop.   Pulmonary:     Effort: Pulmonary effort is normal. No accessory muscle usage, prolonged expiration, respiratory distress or retractions.     Breath sounds: Normal breath sounds. No stridor, decreased air movement or transmitted upper airway sounds. No decreased breath sounds, wheezing, rhonchi or rales.  Skin:    General: Skin is warm and dry.  Neurological:     Mental Status: She is alert and oriented to person, place, and time.      UC Treatments / Results  Labs (all labs ordered are listed, but only abnormal results are displayed) Labs Reviewed - No data to display  EKG None  Radiology No results found.  Procedures Procedures (including critical care time)  Medications Ordered in UC Medications  ketorolac (TORADOL) 30 MG/ML injection 30 mg (30 mg Intramuscular Given 08/23/18 1937)    Initial Impression / Assessment and Plan / UC Course  I have reviewed the triage vital signs and the nursing notes.  Pertinent labs & imaging results that were available during my care of the patient were reviewed by me and considered in my medical decision making (see chart for details).    History and exam more consistent with allergic rhinitis vs viral illness. Patient to restart atrovent. Monitor for next 1-2 weeks, and to call back if needing additional allergy medication.  Discussed if develop more viral symptoms such as worsening cough, fever, chills, body aches, to self quarantine. Self quarantine guidelines discussed. Return precautions given. Patient expresses understanding and agrees to plan.  Final Clinical Impressions(s) / UC Diagnoses   Final diagnoses:  Seasonal allergic rhinitis due to other allergic trigger    ED Prescriptions    None        Ok Edwards, PA-C 08/23/18 1938

## 2018-09-15 ENCOUNTER — Ambulatory Visit: Payer: BC Managed Care – PPO

## 2018-10-06 ENCOUNTER — Telehealth: Payer: BC Managed Care – PPO | Admitting: Physician Assistant

## 2018-10-06 DIAGNOSIS — J0110 Acute frontal sinusitis, unspecified: Secondary | ICD-10-CM

## 2018-10-06 MED ORDER — AMOXICILLIN-POT CLAVULANATE 875-125 MG PO TABS: 1.0000 | ORAL_TABLET | Freq: Two times a day (BID) | ORAL | 0 refills | Status: DC

## 2018-10-06 NOTE — Progress Notes (Signed)

## 2018-10-06 NOTE — Progress Notes (Signed)
I have spent 5 minutes in review of e-visit questionnaire, review and updating patient chart, medical decision making and response to patient.   Zyrell Carmean Cody Kamoni Depree, PA-C    

## 2018-11-10 ENCOUNTER — Ambulatory Visit
Admission: EM | Admit: 2018-11-10 | Discharge: 2018-11-10 | Disposition: A | Discharge status: Home / Self Care | Payer: BC Managed Care – PPO | Attending: Family Medicine | Admitting: Family Medicine

## 2018-11-10 ENCOUNTER — Encounter: Payer: Self-pay | Admitting: Emergency Medicine

## 2018-11-10 ENCOUNTER — Other Ambulatory Visit: Payer: Self-pay

## 2018-11-10 DIAGNOSIS — H9202 Otalgia, left ear: Secondary | ICD-10-CM | POA: Diagnosis not present

## 2018-11-10 DIAGNOSIS — H60392 Other infective otitis externa, left ear: Secondary | ICD-10-CM

## 2018-11-10 MED ORDER — NEOMYCIN-POLYMYXIN-HC 3.5-10000-1 OT SUSP: 4.0000 [drp] | Freq: Three times a day (TID) | OTIC | 0 refills | Status: DC

## 2018-11-10 NOTE — ED Provider Notes (Signed)
[place]   [id number] 11/10/18 Arrival Time: 4174  ASSESSMENT & PLAN:  1. Otalgia of left ear   2. Infective otitis externa of left ear   TMs intact.  Begin: Meds ordered this encounter  Medications  . neomycin-polymyxin-hydrocortisone (CORTISPORIN) 3.5-10000-1 OTIC suspension    Sig: Place 4 drops into the left ear 3 (three) times daily.    Dispense:  10 mL    Refill:  0   Discussed typical duration of symptoms. OTC symptom care as needed. Ensure adequate fluid intake and rest. May f/u with PCP or here as needed or if not improving over the next 3-5 days.  Reviewed expectations re: course of current medical issues. Questions answered. Outlined signs and symptoms indicating need for more acute intervention. Patient verbalized understanding. After Visit Summary given.   SUBJECTIVE: History from: patient.  Carmen [name] is a 38 y.o. female who presents with complaint of left otalgia; without drainage; without bleeding. Onset gradual, over the past 4-6 days; 'after getting a lot of water in my ears'. Recent cold symptoms: none. Fever: no. Overall normal PO intake without n/v. Sick contacts: no. No FB inserted into ears. Slightly decreased hearing out of left ear. No specific aggravating or alleviating factors reported. OTC treatment: OTC gtts without much relief. No h/o frequent ear infections.  Social History   Tobacco Use  Smoking Status Never Smoker  Smokeless Tobacco Never Used   ROS: As per HPI. All other systems negative.   Vitals:   11/10/18 1037  BP: 133/87  Pulse: 74  Resp: 16  Temp: 98.1 F (36.7 C)  TempSrc: Oral  SpO2: 96%    General appearance: alert; NAD HEENT: throat normal Ear Canal: edema and inflammation on the left; no FB TM: bilateral: normal appearing Neck: supple without LAD CV: RRR Lungs: unlabored respirations, symmetrical air entry; cough: absent; no respiratory distress Skin: warm and dry Psychological: alert and  cooperative; normal mood and affect  No Known Allergies  Past Medical History:  Diagnosis Date  . Anemia   . Bilateral carpal tunnel syndrome   . Essential hypertension   . Fibroids    Uterine unspecified  . Genital herpes   . Heart murmur   . History of migraine headaches    Seen By Dr. Venia Minks  . IBS (irritable bowel syndrome)   . Migraine 07/11/2014  . Morbid obesity ([place])   . OSA (obstructive sleep apnea)    Family History  Problem Relation Age of Onset  . Breast cancer Mother   . Hypertension Mother   . Hyperlipidemia Mother   . Hypertension Brother   . Hypertension Father   . Hyperlipidemia Father   . Diabetes Other   . Hypercholesterolemia Other   . Gout Other   . Congestive Heart Failure Other   . Alzheimer's disease Other   . Heart attack Other   . Ovarian cancer Other        Aunt  . Stroke Maternal Grandmother   . Kidney disease Maternal Grandmother   . Heart disease Maternal Grandmother   . Diabetes Paternal Grandmother   . Breast cancer Maternal Aunt   . Heart disease Maternal Grandfather   . Hypertension Maternal Grandfather   . Breast cancer Maternal Aunt   . Migraines Neg Hx    Social History   Socioeconomic History  . Marital status: Single    Spouse name: Not on file  . Number of children: 2  . Years of education: 71  .  Highest education level: Not on file  Occupational History  . Occupation: Metallurgist- elementary   Social Needs  . Financial resource strain: Not on file  . Food insecurity    Worry: Not on file    Inability: Not on file  . Transportation needs    Medical: Not on file    Non-medical: Not on file  Tobacco Use  . Smoking status: Never Smoker  . Smokeless tobacco: Never Used  Substance and Sexual Activity  . Alcohol use: No    Alcohol/week: 0.0 standard drinks  . Drug use: No  . Sexual activity: Not Currently    Birth control/protection: I.U.D.  Lifestyle  . Physical activity    Days per week: Not on  file    Minutes per session: Not on file  . Stress: Not on file  Relationships  . Social Herbalist on phone: Not on file    Gets together: Not on file    Attends religious service: Not on file    Active member of club or organization: Not on file    Attends meetings of clubs or organizations: Not on file    Relationship status: Not on file  . Intimate partner violence    Fear of current or ex partner: Not on file    Emotionally abused: Not on file    Physically abused: Not on file    Forced sexual activity: Not on file  Other Topics Concern  . Not on file  Social History Narrative   Lives at home with 2 kids.   Patient is right handed.   Patient drinks 4 sodas/week            Vanessa Kick, MD 11/10/18 1101

## 2018-11-10 NOTE — ED Triage Notes (Signed)
Pt presents to [facility] for assessment of bilateral ear pain after getting water in her ears in the shower since last Thursday.  States she has been pushing her finer in to her ear and pulling it out to pop it.  Pt states the internal and external ear is hurting, especially on the left side.  Pt c/o decreased hearing.

## 2018-12-11 ENCOUNTER — Other Ambulatory Visit: Payer: Self-pay | Admitting: Obstetrics

## 2018-12-11 DIAGNOSIS — A6000 Herpesviral infection of urogenital system, unspecified: Secondary | ICD-10-CM

## 2018-12-12 ENCOUNTER — Ambulatory Visit
Admission: EM | Admit: 2018-12-12 | Discharge: 2018-12-12 | Disposition: A | Discharge status: Home / Self Care | Payer: BC Managed Care – PPO | Attending: Family Medicine | Admitting: Family Medicine

## 2018-12-12 ENCOUNTER — Encounter: Payer: Self-pay | Admitting: Emergency Medicine

## 2018-12-12 ENCOUNTER — Other Ambulatory Visit: Payer: Self-pay

## 2018-12-12 DIAGNOSIS — L089 Local infection of the skin and subcutaneous tissue, unspecified: Secondary | ICD-10-CM | POA: Diagnosis not present

## 2018-12-12 DIAGNOSIS — L720 Epidermal cyst: Secondary | ICD-10-CM

## 2018-12-12 DIAGNOSIS — M542 Cervicalgia: Secondary | ICD-10-CM | POA: Diagnosis not present

## 2018-12-12 MED ORDER — IBUPROFEN 800 MG PO TABS: 800.0000 mg | ORAL_TABLET | Freq: Three times a day (TID) | ORAL | 0 refills | Status: DC

## 2018-12-12 MED ORDER — TIZANIDINE HCL 4 MG PO TABS: 4.0000 mg | ORAL_TABLET | Freq: Four times a day (QID) | ORAL | 0 refills | Status: DC | PRN

## 2018-12-12 MED ORDER — CEPHALEXIN 500 MG PO CAPS: 500.0000 mg | ORAL_CAPSULE | Freq: Four times a day (QID) | ORAL | 0 refills | Status: DC

## 2018-12-12 NOTE — Discharge Instructions (Signed)
Take ibuprofen 3 times a day with food.  As needed for pain Take tizanidine as needed for muscle spasm.  This is helpful at bedtime. Take Keflex 4 times a day for the infection in your skin.  Warm compresses to area. Call or return for any problems

## 2018-12-12 NOTE — ED Triage Notes (Signed)
Pt presents to [facility] for assessment of right arm pain and difficulty with movement yesterday, states she kept feeling a "pulling" sensation.   States she went to roll over last night and felt a large "mass" to her right flank area which she has not noted before.  This RN able to palpate a more firm area to her flank.  Patient denies any changes to urination.  States pain to her arm seems to be easing off, but she is concerned for the lump.

## 2018-12-12 NOTE — ED Notes (Signed)
Patient able to ambulate independently  

## 2018-12-12 NOTE — ED Provider Notes (Signed)
[facility]    CSN: [id number] Arrival date & time: 12/12/18  [place]      History   Chief Complaint Chief Complaint  Patient presents with  . Arm Pain    HPI Carmen [name] is a 38 y.o. female.   HPI  Patient states that she was having some pain in her right neck and shoulder yesterday.  Unknown reason.  She was stiff and sore from her neck down to the shoulder blade area.  When she was rubbing the area she felt a bump on her skin.  She states when she rolled over on this area was sore.  She is never had a problem in this area before.  Past Medical History:  Diagnosis Date  . Anemia   . Bilateral carpal tunnel syndrome   . Essential hypertension   . Fibroids    Uterine unspecified  . Genital herpes   . Heart murmur   . History of migraine headaches    Seen By Dr. Venia Minks  . IBS (irritable bowel syndrome)   . Migraine 07/11/2014  . Morbid obesity ([place])   . OSA (obstructive sleep apnea)     Patient Active Problem List   Diagnosis Date Noted  . Somnolence, daytime 11/30/2014  . Migraine 07/11/2014  . Anemia 06/27/2013  . Vaginitis and vulvovaginitis, unspecified 06/27/2013  . Obesity 03/09/2013  . Genital herpes, unspecified 09/06/2012  . Leiomyoma of uterus, unspecified 09/06/2012    Past Surgical History:  Procedure Laterality Date  . CARPAL TUNNEL RELEASE Right   . CESAREAN SECTION      x 2  . WISDOM TOOTH EXTRACTION      OB History    Gravida  5   Para  2   Term  2   Preterm  0   AB  3   Living  2     SAB  3   TAB  0   Ectopic  0   Multiple  0   Live Births               Home Medications    Prior to Admission medications   Medication Sig Start Date End Date Taking? Authorizing Provider  cetirizine (ZYRTEC) 10 MG tablet Take 1 tablet (10 mg total) by mouth daily. 08/04/18  Yes Scot Jun, FNP  hydrochlorothiazide (HYDRODIURIL) 25 MG tablet Take 1 tablet (25 mg total) by mouth daily. 08/04/18  Yes  Scot Jun, FNP  ipratropium (ATROVENT) 0.06 % nasal spray Place 2 sprays into both nostrils 4 (four) times daily. 05/17/18  Yes Yu, Amy V, PA-C  Multiple Vitamin (MULTIVITAMIN WITH MINERALS) TABS tablet Take 1 tablet by mouth daily.   Yes [provider]  valACYclovir (VALTREX) 500 MG tablet TAKE 1 TABLET (500 MG) BY MOUTH TWICE DAILY FOR 5 DAYS AS NEEDED FOR OUTBREAKS 12/12/18  Yes Shelly Bombard, MD  dicyclomine (BENTYL) 20 MG tablet Take 1 tablet (20 mg total) by mouth 2 (two) times daily. 07/20/18 12/12/18 Yes Yu, Amy V, PA-C  cephALEXin (KEFLEX) 500 MG capsule Take 1 capsule (500 mg total) by mouth 4 (four) times daily. 12/12/18   Raylene Everts, MD  ibuprofen (ADVIL) 800 MG tablet Take 1 tablet (800 mg total) by mouth 3 (three) times daily. 12/12/18   Raylene Everts, MD  levonorgestrel (MIRENA) 20 MCG/24HR IUD 1 each by Intrauterine route once. Implanted December 2014    [provider]  tiZANidine (ZANAFLEX) 4 MG tablet  Take 1 tablet (4 mg total) by mouth every 6 (six) hours as needed for muscle spasms. 12/12/18   Raylene Everts, MD  diltiazem (CARDIZEM CD) 120 MG 24 hr capsule Take 1 capsule (120 mg total) by mouth daily. 08/04/18 12/12/18  Scot Jun, FNP    Family History Family History  Problem Relation Age of Onset  . Breast cancer Mother   . Hypertension Mother   . Hyperlipidemia Mother   . Hypertension Brother   . Hypertension Father   . Hyperlipidemia Father   . Diabetes Other   . Hypercholesterolemia Other   . Gout Other   . Congestive Heart Failure Other   . Alzheimer's disease Other   . Heart attack Other   . Ovarian cancer Other        Aunt  . Stroke Maternal Grandmother   . Kidney disease Maternal Grandmother   . Heart disease Maternal Grandmother   . Diabetes Paternal Grandmother   . Breast cancer Maternal Aunt   . Heart disease Maternal Grandfather   . Hypertension Maternal Grandfather   . Breast cancer Maternal Aunt    . Migraines Neg Hx     Social History Social History   Tobacco Use  . Smoking status: Never Smoker  . Smokeless tobacco: Never Used  Substance Use Topics  . Alcohol use: No    Alcohol/week: 0.0 standard drinks  . Drug use: No     Allergies   Patient has no known allergies.   Review of Systems Review of Systems  Constitutional: Negative for chills and fever.  HENT: Negative for ear pain and sore throat.   Eyes: Negative for pain and visual disturbance.  Respiratory: Negative for cough and shortness of breath.   Cardiovascular: Negative for chest pain and palpitations.  Gastrointestinal: Negative for abdominal pain and vomiting.  Genitourinary: Negative for dysuria and hematuria.  Musculoskeletal: Positive for neck pain and neck stiffness. Negative for arthralgias and back pain.  Skin: Negative for color change and rash.       lump  Neurological: Negative for seizures and syncope.  All other systems reviewed and are negative.    Physical Exam Triage Vital Signs ED Triage Vitals  Enc Vitals Group     BP 12/12/18 1845 (!) 153/89     Pulse Rate 12/12/18 1845 87     Resp 12/12/18 1845 16     Temp 12/12/18 1845 98.5 F (36.9 C)     Temp Source 12/12/18 1845 Oral     SpO2 12/12/18 1845 96 %     Weight --      Height --      Head Circumference --      Peak Flow --      Pain Score 12/12/18 1849 5     Pain Loc --      Pain Edu? --      Excl. in [place]? --    No data found.  Updated Vital Signs BP (!) 153/89 (BP Location: Left Arm)   Pulse 87   Temp 98.5 F (36.9 C) (Oral)   Resp 16   SpO2 96%      Physical Exam Constitutional:      General: She is not in acute distress.    Appearance: She is well-developed.  HENT:     Head: Normocephalic and atraumatic.  Eyes:     Conjunctiva/sclera: Conjunctivae normal.     Pupils: Pupils are equal, round, and reactive to light.  Neck:  Musculoskeletal: Normal range of motion.  Cardiovascular:     Rate and Rhythm:  Normal rate.  Pulmonary:     Effort: Pulmonary effort is normal. No respiratory distress.  Abdominal:     General: There is no distension.     Palpations: Abdomen is soft.  Musculoskeletal: Normal range of motion.       Back:  Skin:    General: Skin is warm and dry.  Neurological:     General: No focal deficit present.     Mental Status: She is alert.  Psychiatric:        Mood and Affect: Mood normal.        Behavior: Behavior normal.      UC Treatments / Results  Labs (all labs ordered are listed, but only abnormal results are displayed) Labs Reviewed - No data to display  EKG   Radiology No results found.  Procedures Procedures (including critical care time)  Medications Ordered in UC Medications - No data to display  Initial Impression / Assessment and Plan / UC Course  I have reviewed the triage vital signs and the nursing notes.  Pertinent labs & imaging results that were available during my care of the patient were reviewed by me and considered in my medical decision making (see chart for details).     Discussed epithelial inclusion cyst.  Mild infection Final Clinical Impressions(s) / UC Diagnoses   Final diagnoses:  Infected epithelial inclusion cyst  Neck pain, musculoskeletal     Discharge Instructions     Take ibuprofen 3 times a day with food.  As needed for pain Take tizanidine as needed for muscle spasm.  This is helpful at bedtime. Take Keflex 4 times a day for the infection in your skin.  Warm compresses to area. Call or return for any problems   ED Prescriptions    Medication Sig Dispense Auth. Provider   cephALEXin (KEFLEX) 500 MG capsule Take 1 capsule (500 mg total) by mouth 4 (four) times daily. 20 capsule Raylene Everts, MD   tiZANidine (ZANAFLEX) 4 MG tablet Take 1 tablet (4 mg total) by mouth every 6 (six) hours as needed for muscle spasms. 21 tablet Raylene Everts, MD   ibuprofen (ADVIL) 800 MG tablet Take 1 tablet  (800 mg total) by mouth 3 (three) times daily. 21 tablet Raylene Everts, MD     Controlled Substance Prescriptions [place] Controlled Substance Registry consulted? Not Applicable   Raylene Everts, MD 12/12/18 [phone]

## 2018-12-16 ENCOUNTER — Ambulatory Visit
Admission: EM | Admit: 2018-12-16 | Discharge: 2018-12-16 | Disposition: A | Discharge status: Home / Self Care | Payer: BC Managed Care – PPO | Attending: Emergency Medicine | Admitting: Emergency Medicine

## 2018-12-16 DIAGNOSIS — L0291 Cutaneous abscess, unspecified: Secondary | ICD-10-CM | POA: Diagnosis not present

## 2018-12-16 NOTE — Discharge Instructions (Signed)
Keep this dressing on for the next 24 hours or so.  Warm compresses 3-4 times a day to promote drainage.  Keep covered to keep clean.  Wash daily starting tomorrow, with soap and water.  Continue with antibiotics as prescribed.  If symptoms worsen or do not improve in the next week to return to be seen or to follow up with your PCP.

## 2018-12-16 NOTE — ED Triage Notes (Signed)
Pt c/o a cyst to her rt mid back area, states seen here 4 days ago and given antibiotic. Pt state pain is still the same, feels like its getting worse then better

## 2018-12-16 NOTE — ED Provider Notes (Signed)
[facility]    CSN: [id number] Arrival date & time: 12/16/18  1219      History   Chief Complaint Chief Complaint  Patient presents with  . Abscess    HPI Carmen [name] is a 38 y.o. female.   Carmen [name] presents with complaints of persistent discomfort to right upper back. She has an area which she first noted 7/26 as a firm lump which progressed in size and tenderness. And was seen here 7/27 with a need aspiration completed with some serous drainage, and antibiotics were started. She feels like the surrounding area has started to feel better but the swollen mass is more painful. No drainage. No fevers. Denies any previous similar. Friction and sweating has increased the discomfort. Hx of migraines, ibs, murmur, htn, anemia.     ROS per HPI, negative if not otherwise mentioned.      Past Medical History:  Diagnosis Date  . Anemia   . Bilateral carpal tunnel syndrome   . Essential hypertension   . Fibroids    Uterine unspecified  . Genital herpes   . Heart murmur   . History of migraine headaches    Seen By Dr. Venia Minks  . IBS (irritable bowel syndrome)   . Migraine 07/11/2014  . Morbid obesity ([place])   . OSA (obstructive sleep apnea)     Patient Active Problem List   Diagnosis Date Noted  . Somnolence, daytime 11/30/2014  . Migraine 07/11/2014  . Anemia 06/27/2013  . Vaginitis and vulvovaginitis, unspecified 06/27/2013  . Obesity 03/09/2013  . Genital herpes, unspecified 09/06/2012  . Leiomyoma of uterus, unspecified 09/06/2012    Past Surgical History:  Procedure Laterality Date  . CARPAL TUNNEL RELEASE Right   . CESAREAN SECTION      x 2  . WISDOM TOOTH EXTRACTION      OB History    Gravida  5   Para  2   Term  2   Preterm  0   AB  3   Living  2     SAB  3   TAB  0   Ectopic  0   Multiple  0   Live Births               Home Medications    Prior to Admission medications   Medication Sig  Start Date End Date Taking? Authorizing Provider  cephALEXin (KEFLEX) 500 MG capsule Take 1 capsule (500 mg total) by mouth 4 (four) times daily. 12/12/18   Raylene Everts, MD  cetirizine (ZYRTEC) 10 MG tablet Take 1 tablet (10 mg total) by mouth daily. 08/04/18   Scot Jun, FNP  hydrochlorothiazide (HYDRODIURIL) 25 MG tablet Take 1 tablet (25 mg total) by mouth daily. 08/04/18   Scot Jun, FNP  ibuprofen (ADVIL) 800 MG tablet Take 1 tablet (800 mg total) by mouth 3 (three) times daily. 12/12/18   Raylene Everts, MD  ipratropium (ATROVENT) 0.06 % nasal spray Place 2 sprays into both nostrils 4 (four) times daily. 05/17/18   Ok Edwards, PA-C  levonorgestrel (MIRENA) 20 MCG/24HR IUD 1 each by Intrauterine route once. Implanted December 2014    [provider]  Multiple Vitamin (MULTIVITAMIN WITH MINERALS) TABS tablet Take 1 tablet by mouth daily.    [provider]  tiZANidine (ZANAFLEX) 4 MG tablet Take 1 tablet (4 mg total) by mouth every 6 (six) hours as needed for muscle spasms. 12/12/18   Meda Coffee,  Jennette Banker, MD  valACYclovir (VALTREX) 500 MG tablet TAKE 1 TABLET (500 MG) BY MOUTH TWICE DAILY FOR 5 DAYS AS NEEDED FOR OUTBREAKS 12/12/18   Shelly Bombard, MD  dicyclomine (BENTYL) 20 MG tablet Take 1 tablet (20 mg total) by mouth 2 (two) times daily. 07/20/18 12/12/18  Ok Edwards, PA-C  diltiazem (CARDIZEM CD) 120 MG 24 hr capsule Take 1 capsule (120 mg total) by mouth daily. 08/04/18 12/12/18  Scot Jun, FNP    Family History Family History  Problem Relation Age of Onset  . Breast cancer Mother   . Hypertension Mother   . Hyperlipidemia Mother   . Hypertension Brother   . Hypertension Father   . Hyperlipidemia Father   . Diabetes Other   . Hypercholesterolemia Other   . Gout Other   . Congestive Heart Failure Other   . Alzheimer's disease Other   . Heart attack Other   . Ovarian cancer Other        Aunt  . Stroke Maternal Grandmother   .  Kidney disease Maternal Grandmother   . Heart disease Maternal Grandmother   . Diabetes Paternal Grandmother   . Breast cancer Maternal Aunt   . Heart disease Maternal Grandfather   . Hypertension Maternal Grandfather   . Breast cancer Maternal Aunt   . Migraines Neg Hx     Social History Social History   Tobacco Use  . Smoking status: Never Smoker  . Smokeless tobacco: Never Used  Substance Use Topics  . Alcohol use: No    Alcohol/week: 0.0 standard drinks  . Drug use: No     Allergies   Patient has no known allergies.   Review of Systems Review of Systems   Physical Exam Triage Vital Signs ED Triage Vitals  Enc Vitals Group     BP 12/16/18 1234 138/88     Pulse Rate 12/16/18 1234 80     Resp 12/16/18 1234 18     Temp 12/16/18 1234 98.4 F (36.9 C)     Temp Source 12/16/18 1234 Oral     SpO2 12/16/18 1234 97 %     Weight --      Height --      Head Circumference --      Peak Flow --      Pain Score 12/16/18 1235 6     Pain Loc --      Pain Edu? --      Excl. in [place]? --    No data found.  Updated Vital Signs BP 138/88 (BP Location: Left Arm)   Pulse 80   Temp 98.4 F (36.9 C) (Oral)   Resp 18   SpO2 97%   Visual Acuity Right Eye Distance:   Left Eye Distance:   Bilateral Distance:    Right Eye Near:   Left Eye Near:    Bilateral Near:     Physical Exam Constitutional:      General: She is not in acute distress.    Appearance: She is well-developed.  Cardiovascular:     Rate and Rhythm: Normal rate.  Pulmonary:     Effort: Pulmonary effort is normal.  Skin:    General: Skin is warm and dry.          Comments: Approximately 1-1.5 cm firm and mildly fluctuant abscess within skin fold to right back; no active drainage, minimal surrounding skin redness   Neurological:     Mental Status: She is alert and oriented  to person, place, and time.      UC Treatments / Results  Labs (all labs ordered are listed, but only abnormal results are  displayed) Labs Reviewed - No data to display  EKG   Radiology No results found.  Procedures Incision and Drainage  Date/Time: 12/16/2018 1:44 PM Performed by: Zigmund Gottron, NP Authorized by: Zigmund Gottron, NP   Consent:    Consent obtained:  Verbal   Consent given by:  Patient   Risks discussed:  Bleeding, incomplete drainage, pain and infection   Alternatives discussed:  No treatment, alternative treatment, delayed treatment, observation and referral Location:    Type:  Abscess   Size:  1.5   Location:  Trunk   Trunk location:  Back Pre-procedure details:    Skin preparation:  Betadine Anesthesia (see MAR for exact dosages):    Anesthesia method:  Local infiltration   Local anesthetic:  Lidocaine 2% w/o epi Procedure type:    Complexity:  Simple Procedure details:    Incision types:  Single straight   Scalpel blade:  11   Wound management:  Probed and deloculated   Drainage:  Purulent and bloody   Drainage amount:  Moderate   Wound treatment:  Wound left open   Packing materials:  None Post-procedure details:    Patient tolerance of procedure:  Tolerated well, no immediate complications   (including critical care time)  Medications Ordered in UC Medications - No data to display  Initial Impression / Assessment and Plan / UC Course  I have reviewed the triage vital signs and the nursing notes.  Pertinent labs & imaging results that were available during my care of the patient were reviewed by me and considered in my medical decision making (see chart for details).     Moderate output with I&D. Wound care discussed. To continue with antibiotics. Cyst vs simple abscess discussed. Return precautions provided. Patient verbalized understanding and agreeable to plan.   Final Clinical Impressions(s) / UC Diagnoses   Final diagnoses:  Abscess     Discharge Instructions     Keep this dressing on for the next 24 hours or so.  Warm compresses 3-4 times  a day to promote drainage.  Keep covered to keep clean.  Wash daily starting tomorrow, with soap and water.  Continue with antibiotics as prescribed.  If symptoms worsen or do not improve in the next week to return to be seen or to follow up with your PCP.     ED Prescriptions    None     Controlled Substance Prescriptions [place] Controlled Substance Registry consulted? Not Applicable   Zigmund Gottron, NP 12/16/18 1345

## 2019-02-16 ENCOUNTER — Other Ambulatory Visit: Payer: Self-pay | Admitting: Emergency Medicine

## 2019-02-16 DIAGNOSIS — Z20822 Contact with and (suspected) exposure to covid-19: Secondary | ICD-10-CM

## 2019-02-17 LAB — NOVEL CORONAVIRUS, NAA: SARS-CoV-2, NAA: NOT DETECTED

## 2020-02-25 ENCOUNTER — Other Ambulatory Visit: Payer: Self-pay | Admitting: Obstetrics

## 2020-02-25 DIAGNOSIS — A6000 Herpesviral infection of urogenital system, unspecified: Secondary | ICD-10-CM

## 2020-05-05 ENCOUNTER — Emergency Department (HOSPITAL_COMMUNITY): Payer: BC Managed Care – PPO

## 2020-05-05 ENCOUNTER — Encounter (HOSPITAL_BASED_OUTPATIENT_CLINIC_OR_DEPARTMENT_OTHER): Payer: Self-pay | Admitting: Emergency Medicine

## 2020-05-05 ENCOUNTER — Other Ambulatory Visit: Payer: Self-pay

## 2020-05-05 ENCOUNTER — Emergency Department (HOSPITAL_BASED_OUTPATIENT_CLINIC_OR_DEPARTMENT_OTHER)
Admission: EM | Admit: 2020-05-05 | Discharge: 2020-05-05 | Disposition: A | Discharge status: Home / Self Care | Payer: BC Managed Care – PPO | Attending: Emergency Medicine | Admitting: Emergency Medicine

## 2020-05-05 ENCOUNTER — Emergency Department (HOSPITAL_BASED_OUTPATIENT_CLINIC_OR_DEPARTMENT_OTHER): Payer: BC Managed Care – PPO

## 2020-05-05 DIAGNOSIS — R519 Headache, unspecified: Secondary | ICD-10-CM | POA: Diagnosis not present

## 2020-05-05 DIAGNOSIS — I10 Essential (primary) hypertension: Secondary | ICD-10-CM | POA: Insufficient documentation

## 2020-05-05 DIAGNOSIS — R200 Anesthesia of skin: Secondary | ICD-10-CM | POA: Insufficient documentation

## 2020-05-05 DIAGNOSIS — Z79899 Other long term (current) drug therapy: Secondary | ICD-10-CM | POA: Diagnosis not present

## 2020-05-05 DIAGNOSIS — G43809 Other migraine, not intractable, without status migrainosus: Secondary | ICD-10-CM

## 2020-05-05 LAB — DIFFERENTIAL
Abs Immature Granulocytes: 0.02 10*3/uL (ref 0.00–0.07)
Basophils Absolute: 0 10*3/uL (ref 0.0–0.1)
Basophils Relative: 1 %
Eosinophils Absolute: 0.6 10*3/uL — ABNORMAL HIGH (ref 0.0–0.5)
Eosinophils Relative: 8 %
Immature Granulocytes: 0 %
Lymphocytes Relative: 41 %
Lymphs Abs: 3.2 10*3/uL (ref 0.7–4.0)
Monocytes Absolute: 0.4 10*3/uL (ref 0.1–1.0)
Monocytes Relative: 5 %
Neutro Abs: 3.5 10*3/uL (ref 1.7–7.7)
Neutrophils Relative %: 45 %

## 2020-05-05 LAB — TROPONIN I (HIGH SENSITIVITY)
Troponin I (High Sensitivity): <2 ng/L (ref <18)
Troponin I (High Sensitivity): <2 ng/L (ref <18)

## 2020-05-05 LAB — PROTIME-INR
INR: 1 (ref 0.8–1.2)
Prothrombin Time: 12.8 seconds (ref 11.4–15.2)

## 2020-05-05 LAB — COMPREHENSIVE METABOLIC PANEL
ALT: 15 U/L (ref 0–44)
AST: 13 U/L — ABNORMAL LOW (ref 15–41)
Albumin: 3.8 g/dL (ref 3.5–5.0)
Alkaline Phosphatase: 89 U/L (ref 38–126)
Anion gap: 8 (ref 5–15)
BUN: 9 mg/dL (ref 6–20)
CO2: 28 mmol/L (ref 22–32)
Calcium: 9 mg/dL (ref 8.9–10.3)
Chloride: 102 mmol/L (ref 98–111)
Creatinine, Ser: 0.77 mg/dL (ref 0.44–1.00)
GFR, Estimated: >60 mL/min (ref >60)
Glucose, Bld: 133 mg/dL — ABNORMAL HIGH (ref 70–99)
Potassium: 3.7 mmol/L (ref 3.5–5.1)
Sodium: 138 mmol/L (ref 135–145)
Total Bilirubin: 0.5 mg/dL (ref 0.3–1.2)
Total Protein: 8.4 g/dL — ABNORMAL HIGH (ref 6.5–8.1)

## 2020-05-05 LAB — CBC
HCT: 39.4 % (ref 36.0–46.0)
Hemoglobin: 12.7 g/dL (ref 12.0–15.0)
MCH: 27.1 pg (ref 26.0–34.0)
MCHC: 32.2 g/dL (ref 30.0–36.0)
MCV: 84.2 fL (ref 80.0–100.0)
Platelets: 379 10*3/uL (ref 150–400)
RBC: 4.68 MIL/uL (ref 3.87–5.11)
RDW: 12.7 % (ref 11.5–15.5)
WBC: 7.8 10*3/uL (ref 4.0–10.5)
nRBC: 0 % (ref 0.0–0.2)

## 2020-05-05 LAB — APTT: aPTT: 30 seconds (ref 24–36)

## 2020-05-05 LAB — CBG MONITORING, ED: Glucose-Capillary: 124 mg/dL — ABNORMAL HIGH (ref 70–99)

## 2020-05-05 LAB — PREGNANCY, URINE: Preg Test, Ur: NEGATIVE

## 2020-05-05 IMAGING — MR MR MRA NECK WO/W CM
7 of 8 series · 41 of 48 positions shown · IV contrast (Gadavist)
Comparison: Head CT earlier same day

CLINICAL DATA: Acute presentation with worsening headache. Question
stroke.

EXAM:
MRI HEAD WITHOUT  CONTRAST
MRA HEAD WITHOUT CONTRAST
MRA NECK WITHOUT AND WITH CONTRAST
TECHNIQUE: Multiplanar, multiecho pulse sequences of the brain and surrounding
structures were obtained without intravenous contrast. Angiographic
images of the Circle of Willis were obtained using MRA technique
without intravenous contrast. Angiographic images of the neck were
obtained using MRA technique without and with intravenous contrast.
Carotid stenosis measurements (when applicable) are obtained
utilizing NASCET criteria, using the distal internal carotid
diameter as the denominator.
CONTRAST:  10mL GADAVIST GADOBUTROL 1 MMOL/ML IV SOLN

[Series 11: tof_fl3d_tra_iso · axial · 0.6mm · 0.52mm/px · z∈[-125,-46]mm · 9 of 133 slices shown]
[im 1/133]
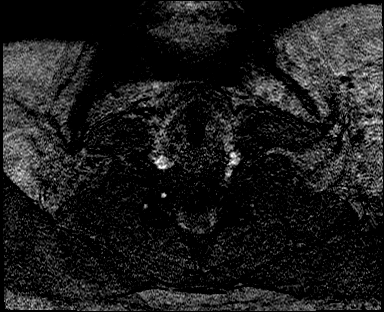
[im 17/133]
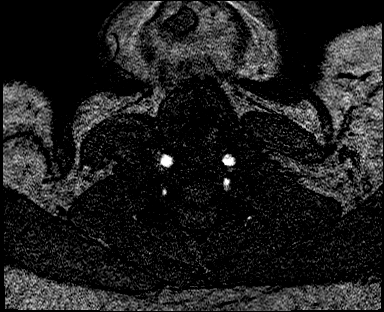
[im 34/133]
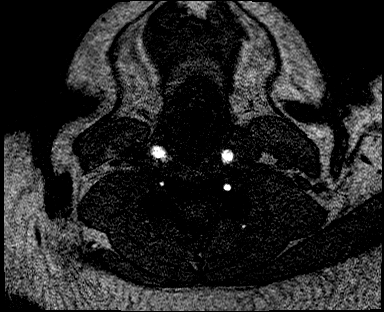
[im 50/133]
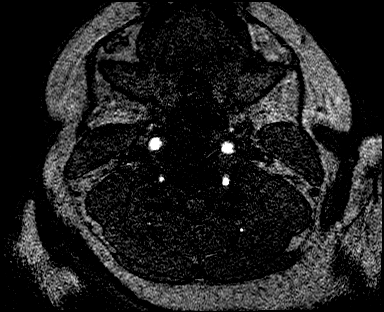
[im 67/133]
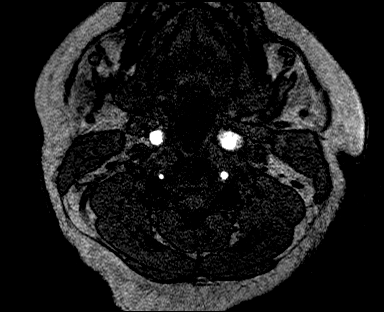
[im 83/133]
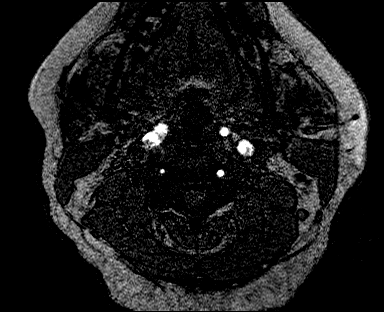
[im 100/133]
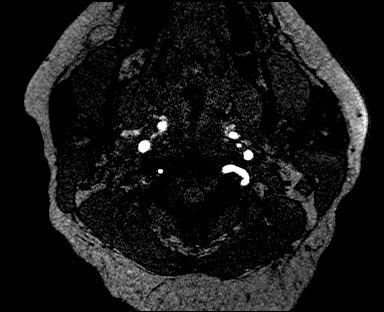
[im 116/133]
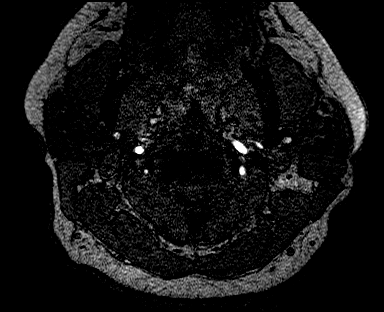
[im 133/133]
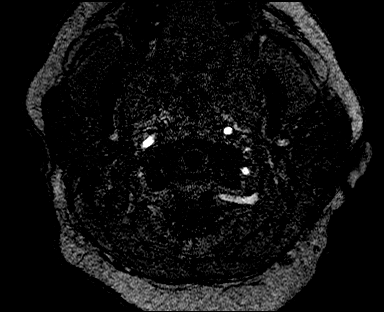

[Series 14: angio_fl3d_cor_pre_ttc=3.0s · coronal · 0.9mm · 0.85mm/px · 6 of 80 slices shown]
[im 1/80]
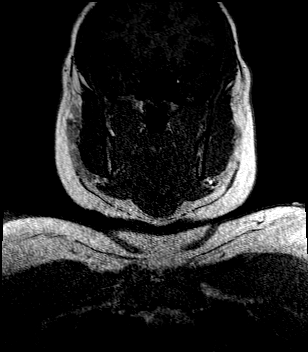
[im 16/80]
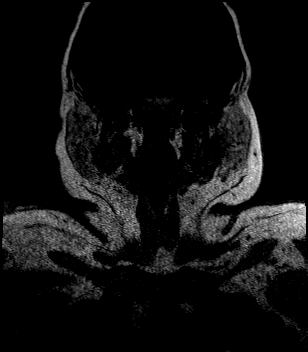
[im 32/80]
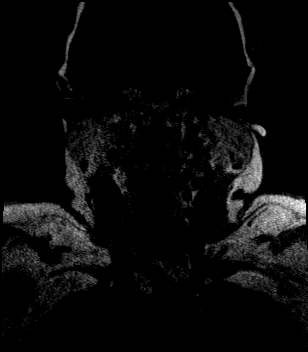
[im 48/80]
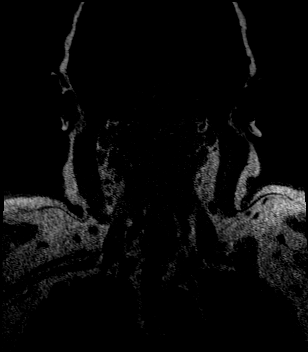
[im 64/80]
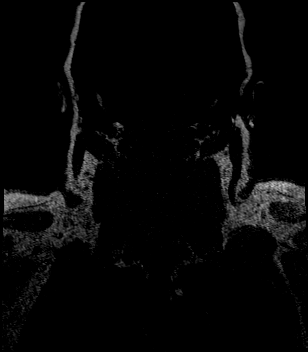
[im 80/80]
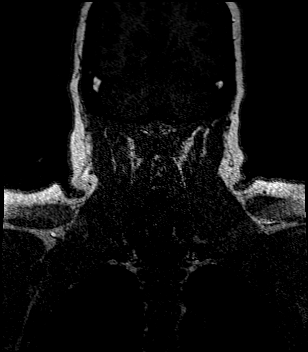

[Series 16: angio_fl3d_cor_post_ttc=3.0s · coronal · 0.9mm · 0.85mm/px · 5 of 78 slices shown (1 of 2)]
[im 1/78]
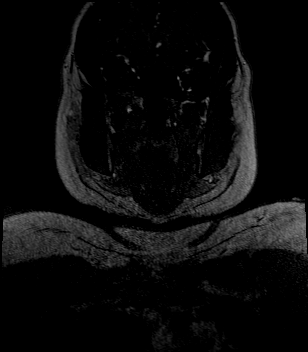
[im 20/78]
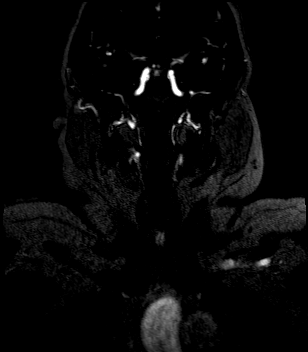
[im 39/78]
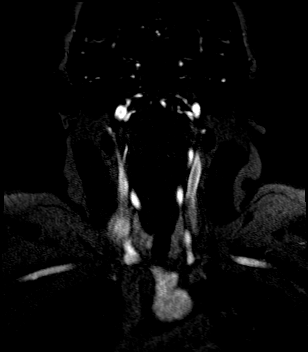
[im 58/78]
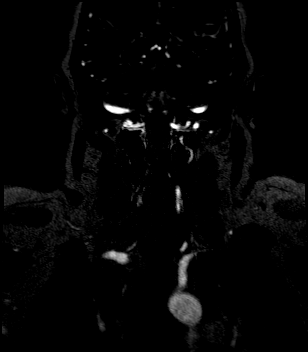
[im 78/78]
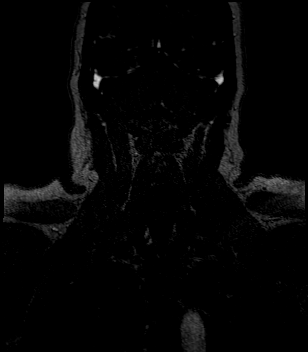

[Series 17: angio_fl3d_cor_post_ttc=3.0s_moco-adv · coronal · 0.9mm · 0.85mm/px · 6 of 79 slices shown (1 of 2)]
[im 1/79]
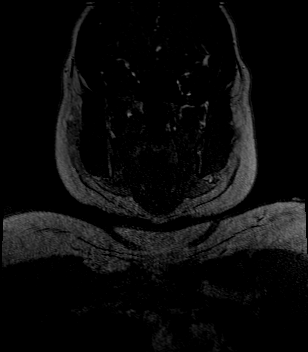
[im 16/79]
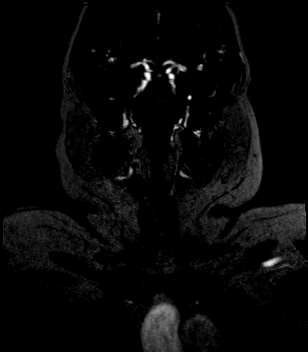
[im 32/79]
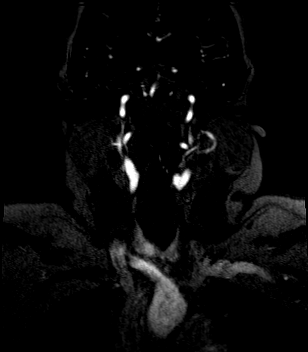
[im 47/79]
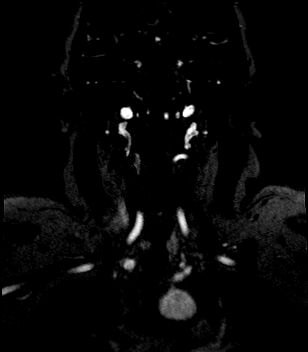
[im 63/79]
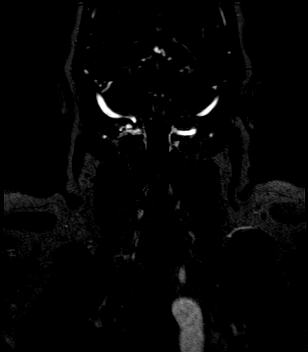
[im 79/79]
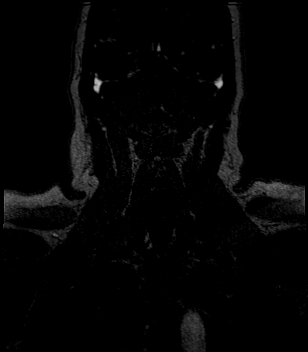

[Series 18: angio_fl3d_cor_post_ttc=3.0s_moco-adv_sub · coronal · 0.9mm · 0.85mm/px · 5 of 75 slices shown]
[im 1/75]
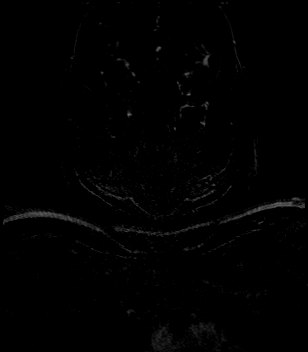
[im 19/75]
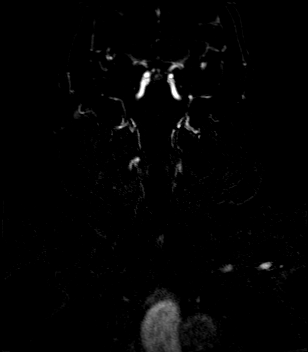
[im 38/75]
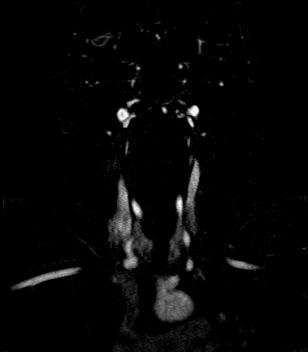
[im 56/75]
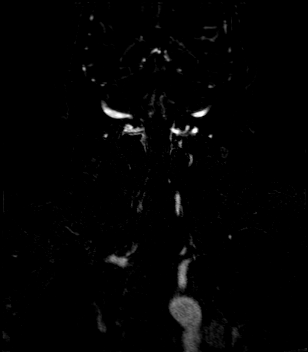
[im 75/75]
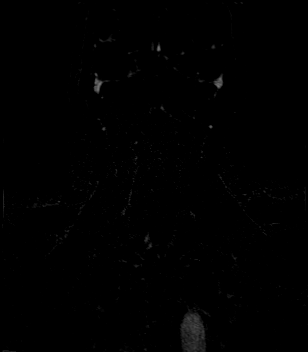

[Series 20: angio_fl3d_cor_post_ttc=3.0s · coronal · 0.9mm · 0.85mm/px · 6 of 80 slices shown (2 of 2)]
[im 1/80]
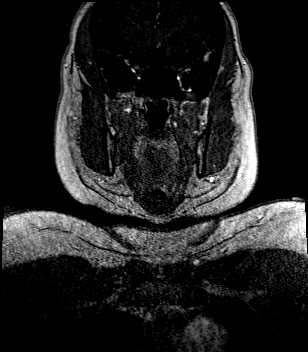
[im 16/80]
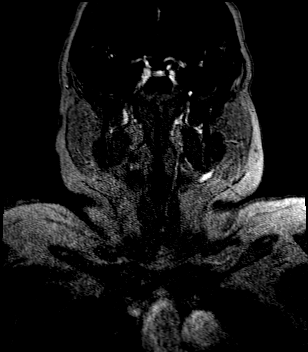
[im 32/80]
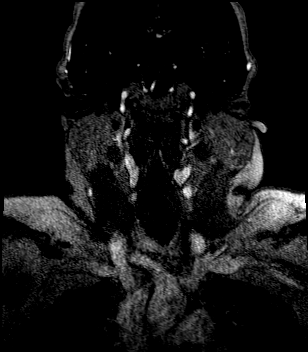
[im 48/80]
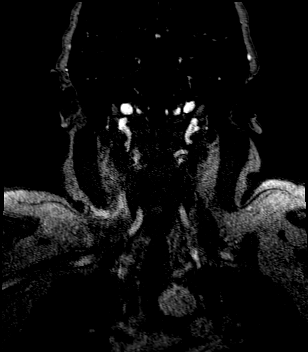
[im 64/80]
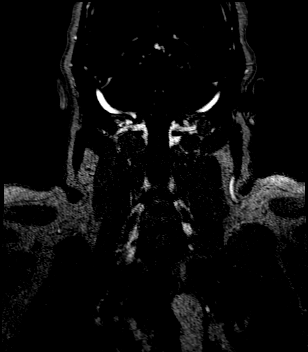
[im 80/80]
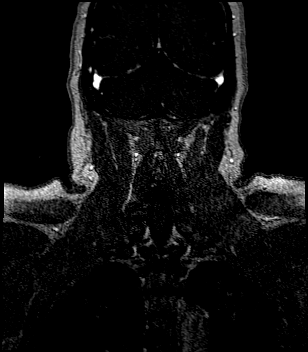

[Series 21: angio_fl3d_cor_post_ttc=3.0s_moco-adv · coronal · 0.9mm · 0.85mm/px · 4 of 80 slices shown (2 of 2)]
[im 1/80]
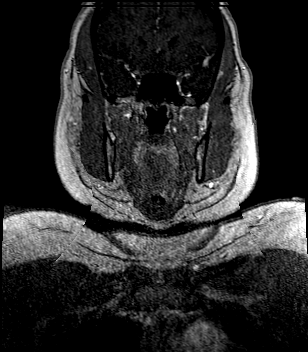
[im 16/80]
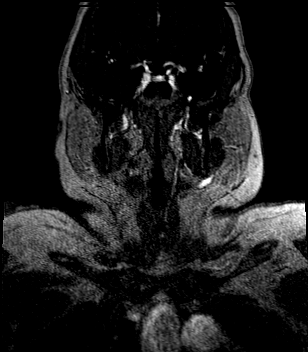
[im 32/80]
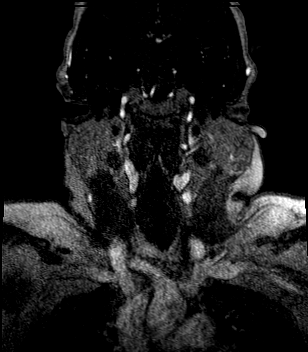
[im 48/80]
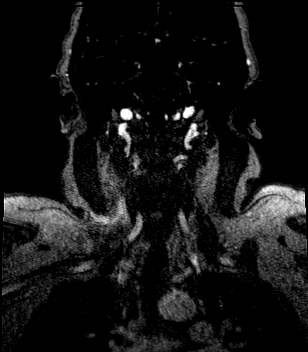

[41 of 48 positions shown; findings below may reference images not displayed]

FINDINGS: MRI HEAD FINDINGS

Brain: The brain has a normal appearance without evidence of
malformation, atrophy, old or acute small or large vessel
infarction, mass lesion, hemorrhage, hydrocephalus or extra-axial
collection.

Vascular: Major vessels at the base of the brain show flow. Venous
sinuses appear patent.

Skull and upper cervical spine: Normal.

Sinuses/Orbits: Clear/normal.

Other: None significant.

MRA HEAD FINDINGS

Both internal carotid arteries are widely patent into the brain. No
siphon stenosis. The anterior and middle cerebral vessels are patent
without proximal stenosis, aneurysm or vascular malformation.

Both vertebral arteries are widely patent to the basilar. No basilar
stenosis. Posterior circulation branch vessels appear normal.

MRA NECK FINDINGS

Branching pattern is normal. Both common carotid arteries widely
patent to the bifurcation. Both carotid bifurcations are normal.
Both cervical internal carotid arteries are normal.

Both vertebral arteries are patent and show antegrade flow. The left
vertebral artery is dominant. Postcontrast imaging shows a large
amount of venous signal adjacent to the small right vertebral
artery, making evaluation difficulty using that technique.
Noncontrast MR angiography of the neck vessels has a normal
appearance. If there is further concern about potential right
vertebral artery pathology, CT angiography could be considered, but
my degree of suspicion is quite low, particularly based on the
noncontrast imaging.
IMPRESSION: 1. Normal MRI of the brain.
2. Normal MR angiography of the neck vessels. Evaluation of the
right vertebral artery is somewhat suboptimal due to prominent
venous contrast. Based on the normal appearance of the noncontrast
exam, I do not have any significant suspicion regarding right
vertebral pathology. If concern persists specifically regarding
right vertebral pathology, CT angiography could be considered.
3. Normal intracranial MR angiography.

## 2020-05-05 IMAGING — CT CT HEAD W/O CM
3 series · 15 of 47 positions shown, 18 images · non-contrast
Comparison: None.

CLINICAL DATA: Worsening headache over the past week.

EXAM:
CT HEAD WITHOUT CONTRAST
TECHNIQUE: Contiguous axial images were obtained from the base of the skull
through the vertex without intravenous contrast.

[Series 2: head wo · axial · 0.47mm/px · z∈[-190,-55]mm · 9 of 33 slices shown, 12 images]
[im 3/33  brain]
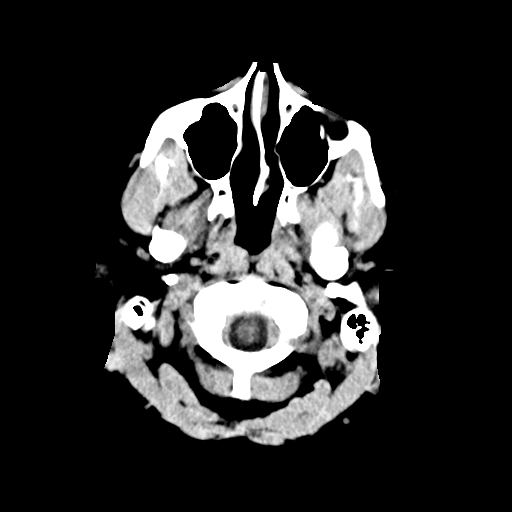
[im 3/33  bone]
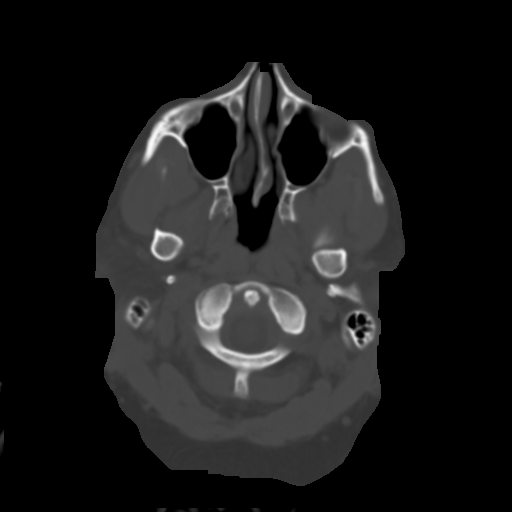
[im 6/33  brain]
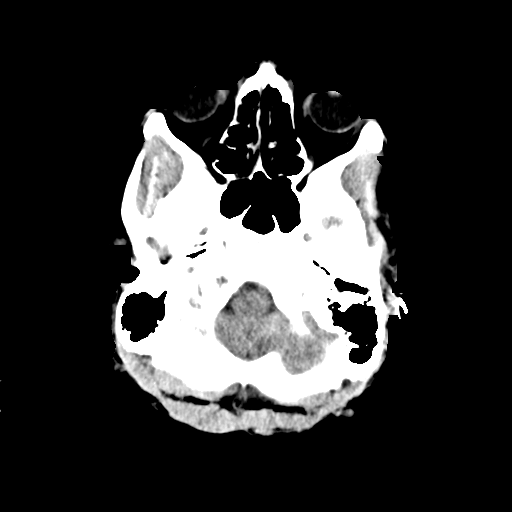
[im 9/33  brain]
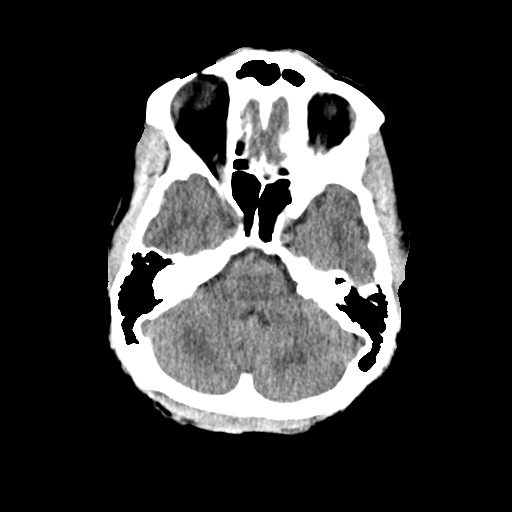
[im 13/33  brain]
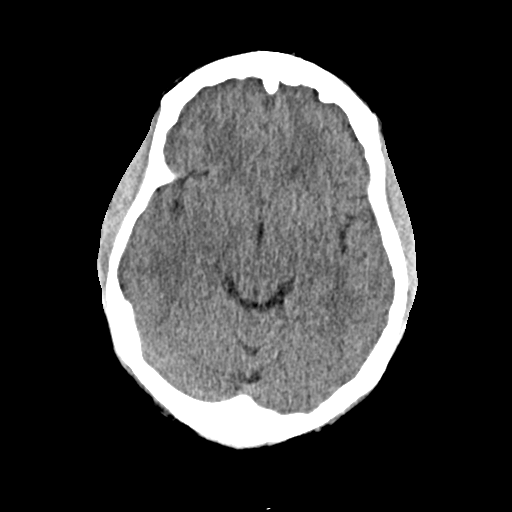
[im 17/33  brain]
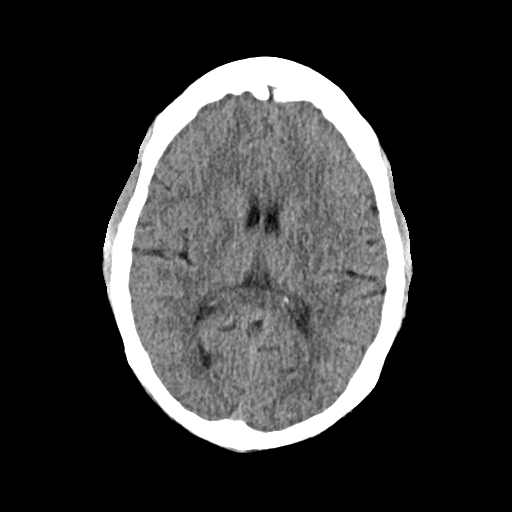
[im 17/33  bone]
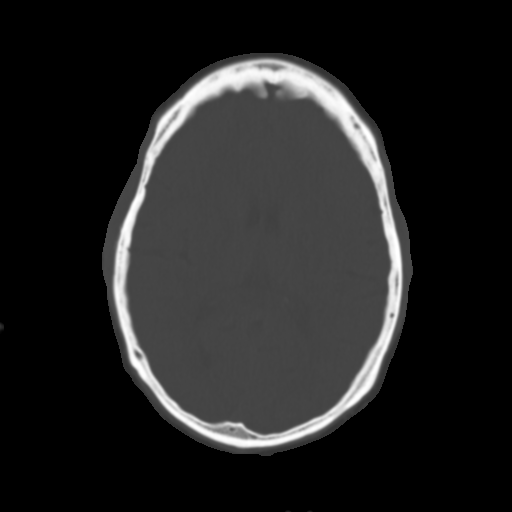
[im 20/33  brain]
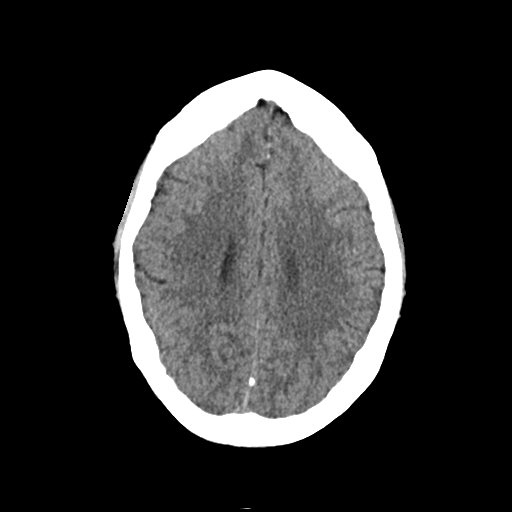
[im 24/33  brain]
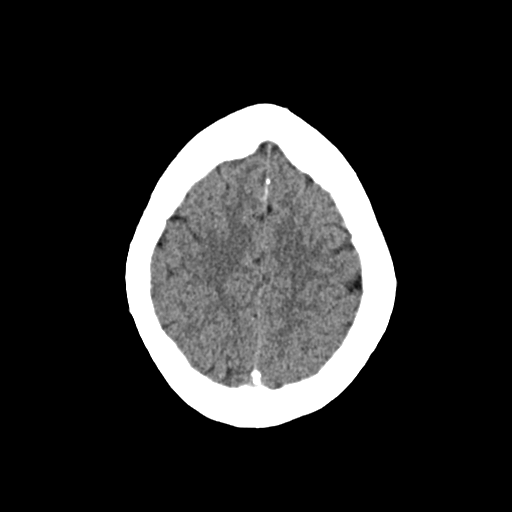
[im 27/33  brain]
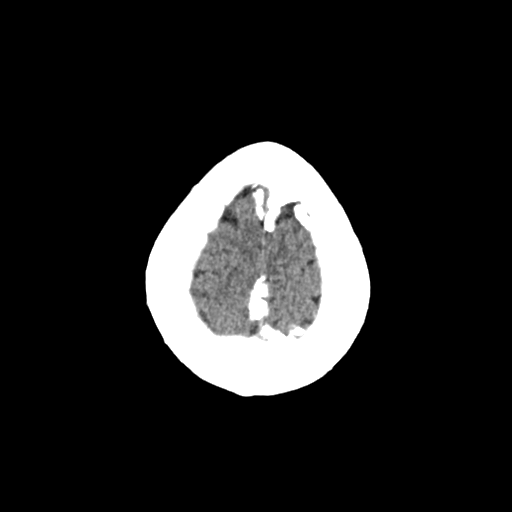
[im 30/33  brain]
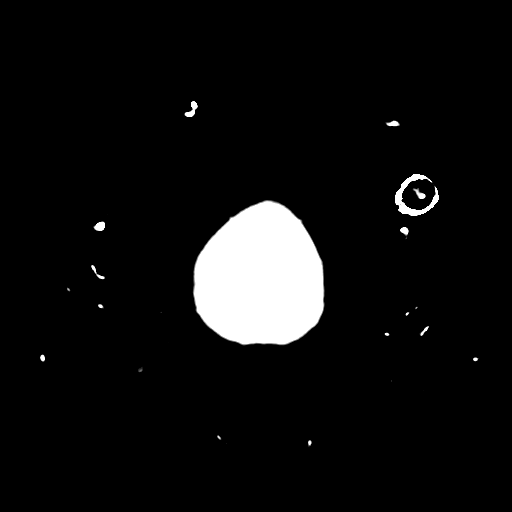
[im 30/33  bone]
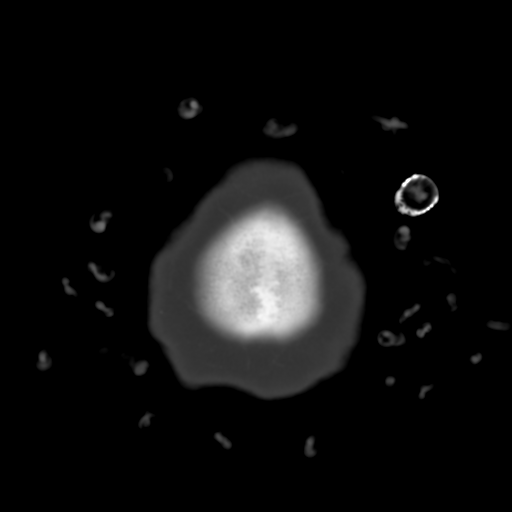

[Series 4: coronal soft · coronal · 0.34mm/px · 3 of 78 slices shown]
[im 26/78  brain]
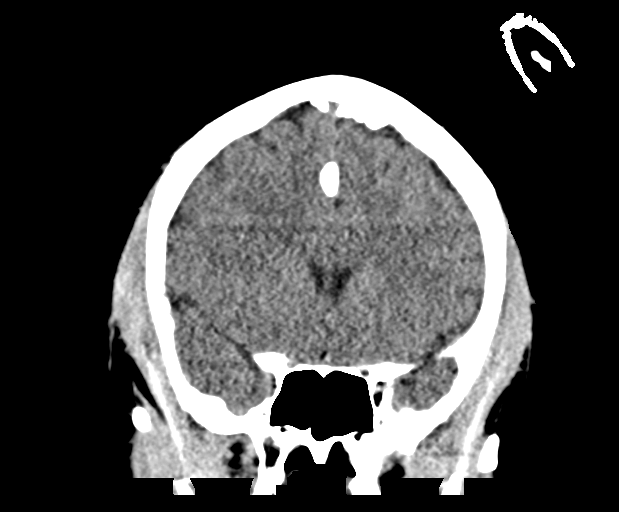
[im 35/78  brain]
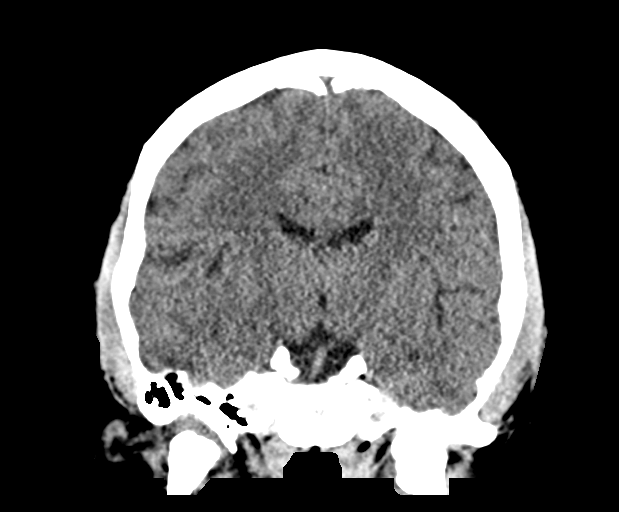
[im 43/78  brain]
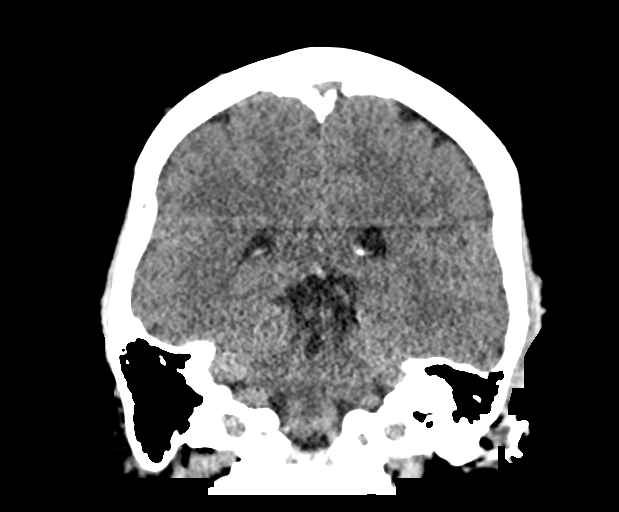

[Series 5: sag soft · sagittal · 0.33mm/px · 3 of 67 slices shown]
[im 23/67  brain]
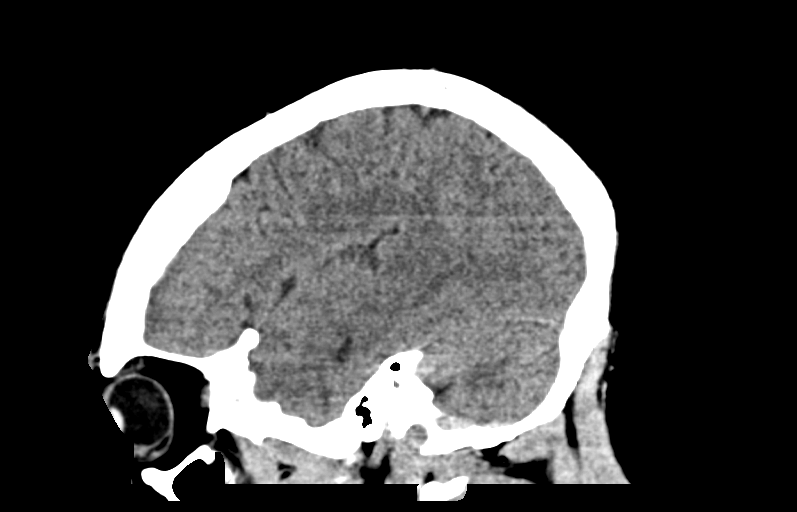
[im 34/67  brain]
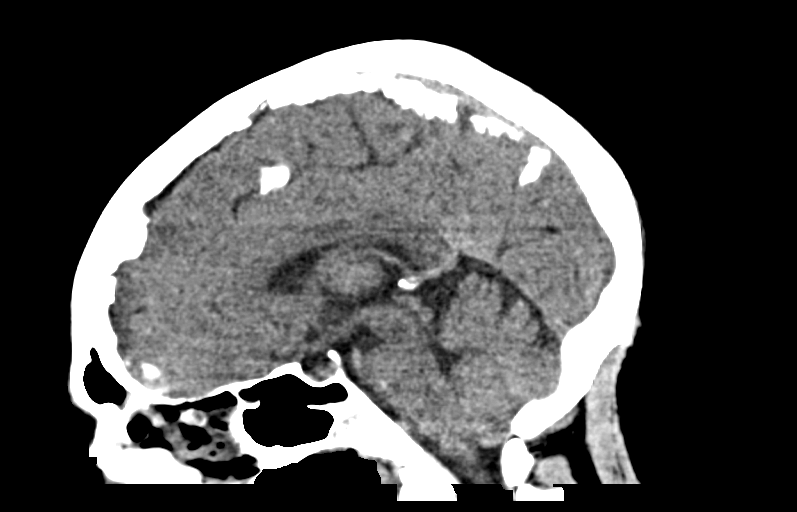
[im 45/67  brain]
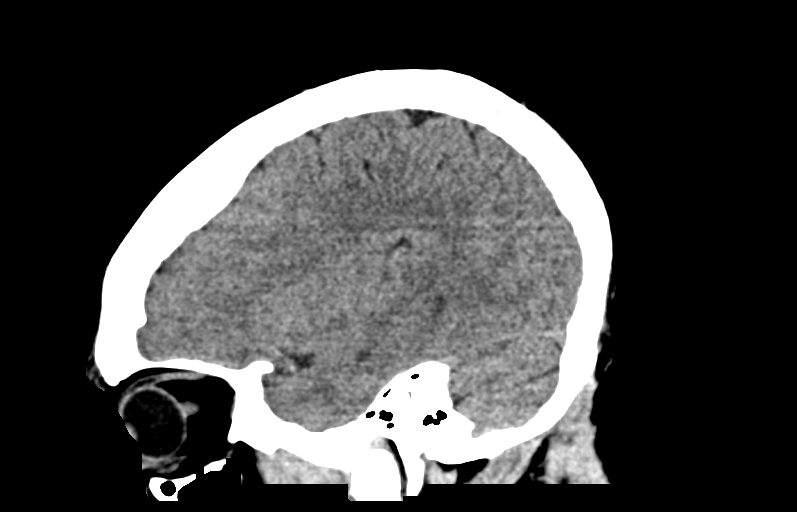

[15 of 47 positions shown; findings below may reference images not displayed]

FINDINGS: Brain: No evidence of acute infarction, hemorrhage, hydrocephalus,
extra-axial collection or mass lesion/mass effect.

Vascular: No hyperdense vessel or unexpected calcification.

Skull: Intact.  No focal lesion.

Sinuses/Orbits: Negative.

Other: None.
IMPRESSION: Negative head CT.

## 2020-05-05 IMAGING — MR MR HEAD W/O CM
10 of 11 series · 43 of 48 positions shown · IV contrast (gadavist)
Comparison: Head CT earlier same day

CLINICAL DATA: Acute presentation with worsening headache. Question
stroke.

EXAM:
MRI HEAD WITHOUT  CONTRAST
MRA HEAD WITHOUT CONTRAST
MRA NECK WITHOUT AND WITH CONTRAST
TECHNIQUE: Multiplanar, multiecho pulse sequences of the brain and surrounding
structures were obtained without intravenous contrast. Angiographic
images of the Circle of Willis were obtained using MRA technique
without intravenous contrast. Angiographic images of the neck were
obtained using MRA technique without and with intravenous contrast.
Carotid stenosis measurements (when applicable) are obtained
utilizing NASCET criteria, using the distal internal carotid
diameter as the denominator.
CONTRAST:  10mL GADAVIST GADOBUTROL 1 MMOL/ML IV SOLN

[Series 5: DWI · axial · 3.0mm · 0.92mm/px · z∈[-41,+106]mm · 9 of 100 slices shown (1 of 4)]
[im 1/100]
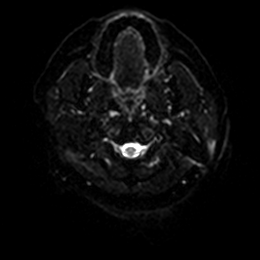
[im 13/100]
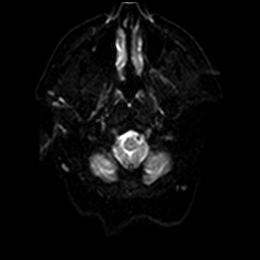
[im 25/100]
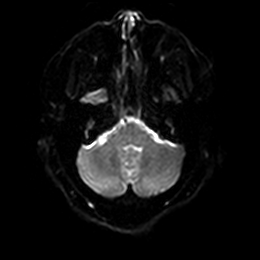
[im 38/100]
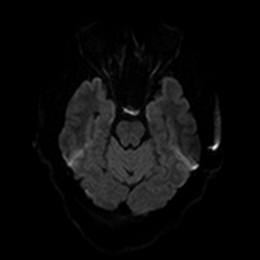
[im 50/100]
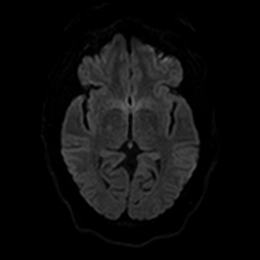
[im 62/100]
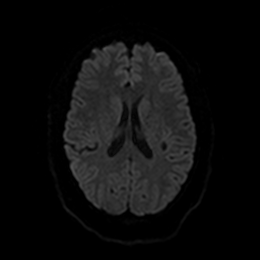
[im 75/100]
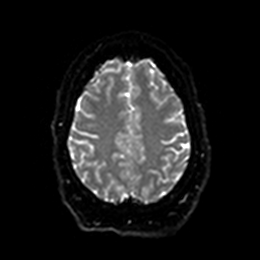
[im 87/100]
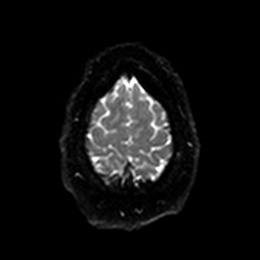
[im 100/100]
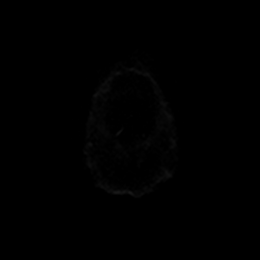

[Series 6: DWI · axial · 3.0mm · 0.92mm/px · z∈[-41,+106]mm · 5 of 50 slices shown (2 of 4)]
[im 1/50]
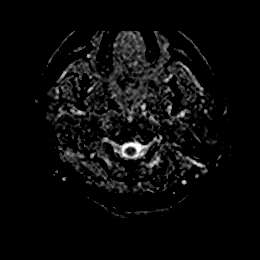
[im 13/50]
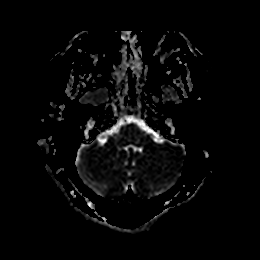
[im 25/50]
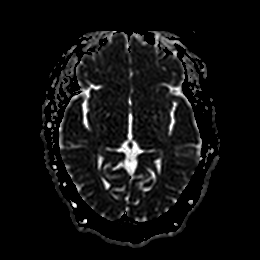
[im 37/50]
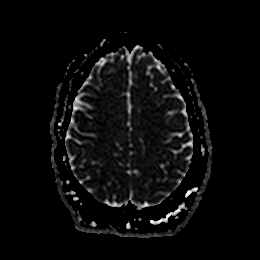
[im 50/50]
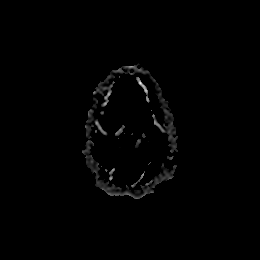

[Series 7: DWI · coronal · 4.0mm · 0.88mm/px · 7 of 73 slices shown (3 of 4)]
[im 1/73]
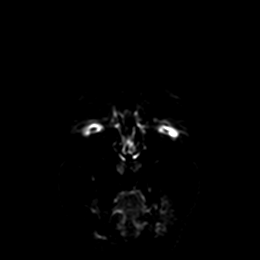
[im 13/73]
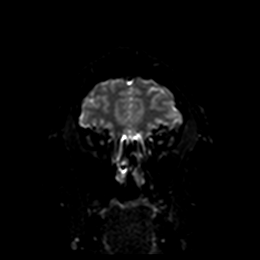
[im 25/73]
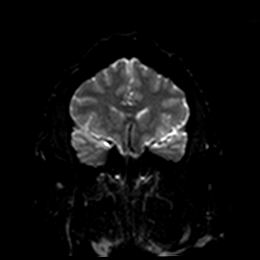
[im 37/73]
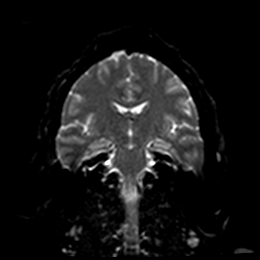
[im 49/73]
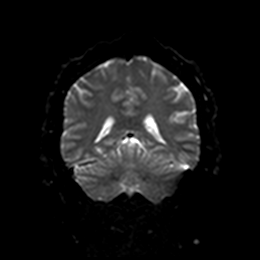
[im 61/73]
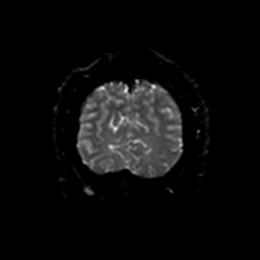
[im 73/73]
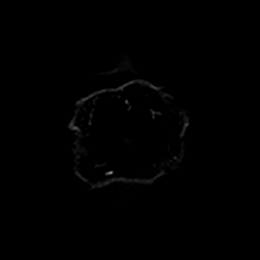

[Series 8: DWI · coronal · 4.0mm · 0.88mm/px · 3 of 37 slices shown (4 of 4)]
[im 1/37]
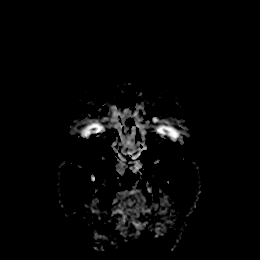
[im 19/37]
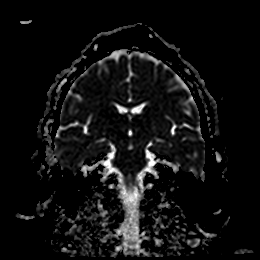
[im 37/37]
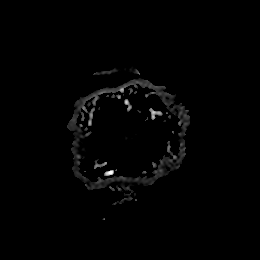

[Series 9: T1 · sagittal · 5.0mm · 0.75mm/px · 2 of 25 slices shown]
[im 1/25]
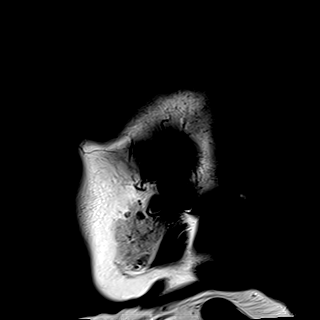
[im 25/25]
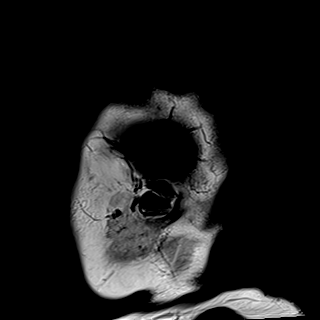

[Series 10: FLAIR · axial · 5.0mm · 0.47mm/px · z∈[-40,+104]mm · 2 of 25 slices shown]
[im 1/25]
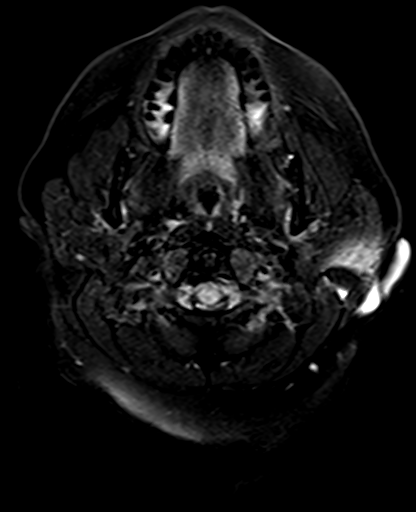
[im 25/25]
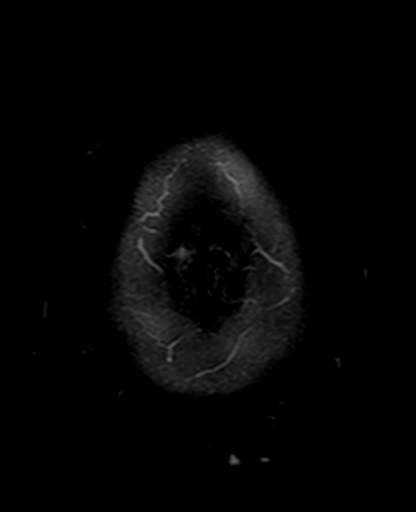

[Series 12: pha_images · axial · 3.0mm · 0.94mm/px · z∈[-43,+119]mm · 5 of 55 slices shown]
[im 1/55]
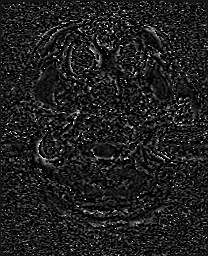
[im 14/55]
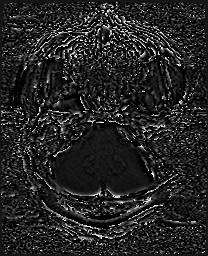
[im 28/55]
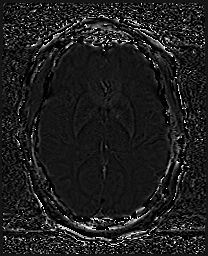
[im 41/55]
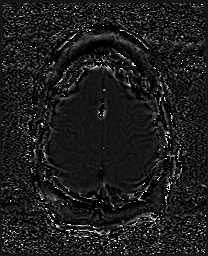
[im 55/55]
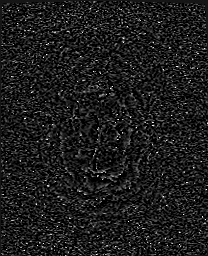

[Series 13: swi_images · axial · 3.0mm · 0.94mm/px · z∈[-46,+119]mm · 5 of 56 slices shown]
[im 1/56]
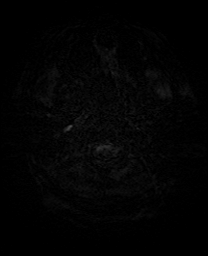
[im 14/56]
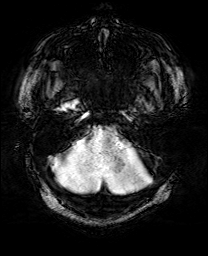
[im 28/56]
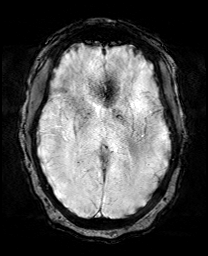
[im 42/56]
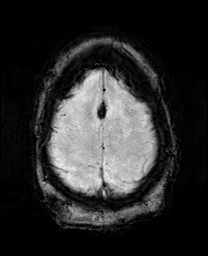
[im 56/56]
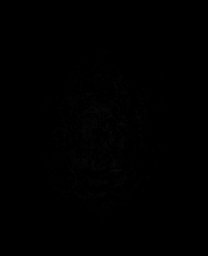

[Series 15: T2 · axial · 5.0mm · 0.75mm/px · z∈[-40,+104]mm · 2 of 25 slices shown (1 of 2)]
[im 1/25]
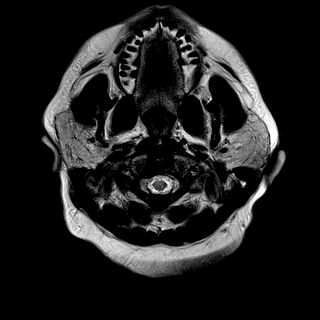
[im 25/25]
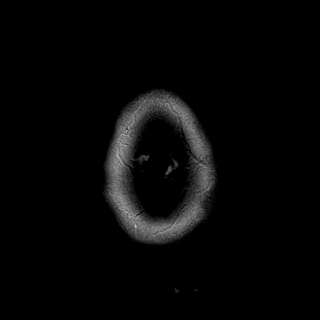

[Series 17: T2 · coronal · 5.0mm · 0.34mm/px · 3 of 30 slices shown (2 of 2)]
[im 1/30]
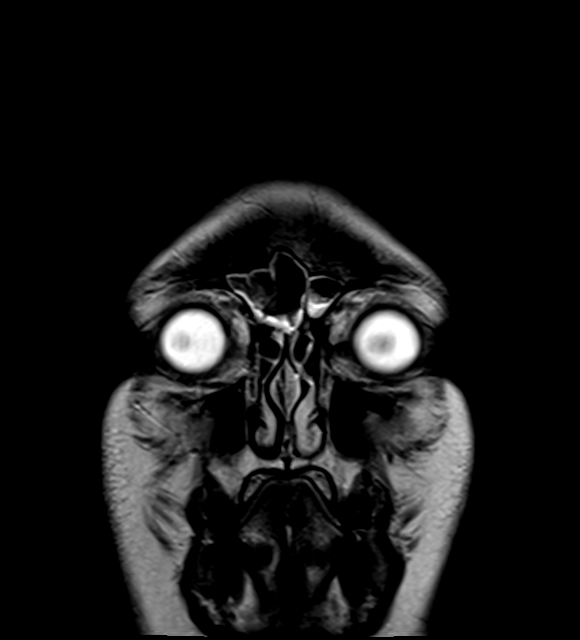
[im 15/30]
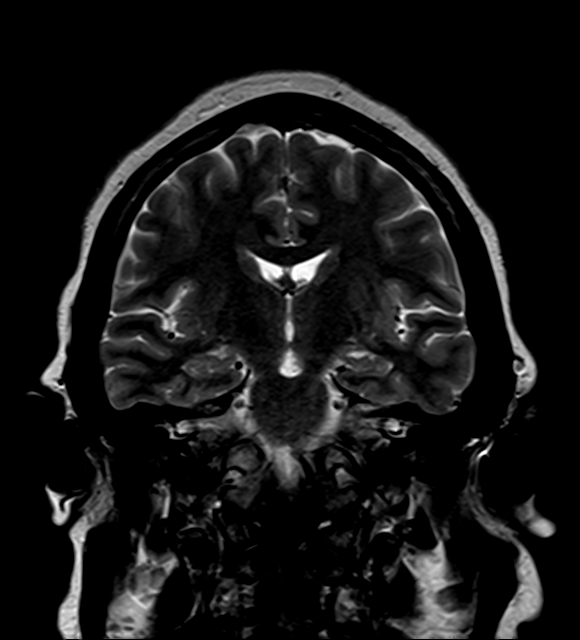
[im 30/30]
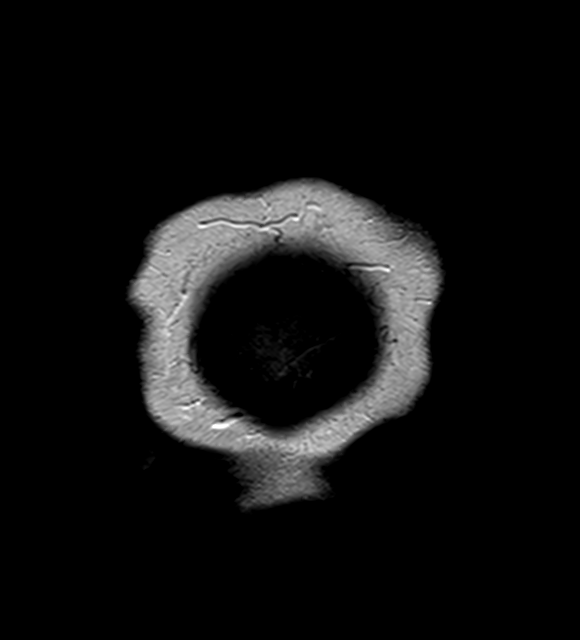

[43 of 48 positions shown; findings below may reference images not displayed]

FINDINGS: MRI HEAD FINDINGS

Brain: The brain has a normal appearance without evidence of
malformation, atrophy, old or acute small or large vessel
infarction, mass lesion, hemorrhage, hydrocephalus or extra-axial
collection.

Vascular: Major vessels at the base of the brain show flow. Venous
sinuses appear patent.

Skull and upper cervical spine: Normal.

Sinuses/Orbits: Clear/normal.

Other: None significant.

MRA HEAD FINDINGS

Both internal carotid arteries are widely patent into the brain. No
siphon stenosis. The anterior and middle cerebral vessels are patent
without proximal stenosis, aneurysm or vascular malformation.

Both vertebral arteries are widely patent to the basilar. No basilar
stenosis. Posterior circulation branch vessels appear normal.

MRA NECK FINDINGS

Branching pattern is normal. Both common carotid arteries widely
patent to the bifurcation. Both carotid bifurcations are normal.
Both cervical internal carotid arteries are normal.

Both vertebral arteries are patent and show antegrade flow. The left
vertebral artery is dominant. Postcontrast imaging shows a large
amount of venous signal adjacent to the small right vertebral
artery, making evaluation difficulty using that technique.
Noncontrast MR angiography of the neck vessels has a normal
appearance. If there is further concern about potential right
vertebral artery pathology, CT angiography could be considered, but
my degree of suspicion is quite low, particularly based on the
noncontrast imaging.
IMPRESSION: 1. Normal MRI of the brain.
2. Normal MR angiography of the neck vessels. Evaluation of the
right vertebral artery is somewhat suboptimal due to prominent
venous contrast. Based on the normal appearance of the noncontrast
exam, I do not have any significant suspicion regarding right
vertebral pathology. If concern persists specifically regarding
right vertebral pathology, CT angiography could be considered.
3. Normal intracranial MR angiography.

## 2020-05-05 IMAGING — MR MR MRA HEAD W/O CM
1 series · 23 of 48 positions shown · IV contrast (gadavist)
Comparison: Head CT earlier same day

CLINICAL DATA: Acute presentation with worsening headache. Question
stroke.

EXAM:
MRI HEAD WITHOUT  CONTRAST
MRA HEAD WITHOUT CONTRAST
MRA NECK WITHOUT AND WITH CONTRAST
TECHNIQUE: Multiplanar, multiecho pulse sequences of the brain and surrounding
structures were obtained without intravenous contrast. Angiographic
images of the Circle of Willis were obtained using MRA technique
without intravenous contrast. Angiographic images of the neck were
obtained using MRA technique without and with intravenous contrast.
Carotid stenosis measurements (when applicable) are obtained
utilizing NASCET criteria, using the distal internal carotid
diameter as the denominator.
CONTRAST:  10mL GADAVIST GADOBUTROL 1 MMOL/ML IV SOLN

[Series 5: 3d cow · axial · 0.5mm · 0.43mm/px · z∈[-36,+46]mm · 23 of 172 slices shown]
[im 1/172]
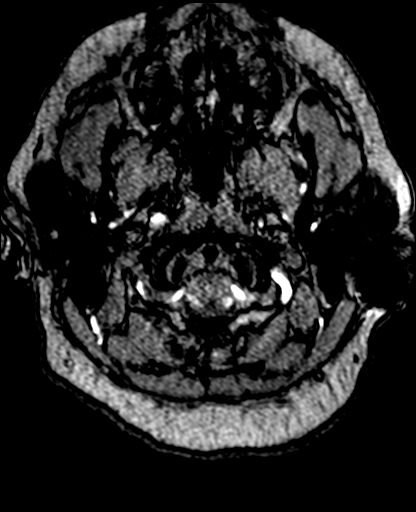
[im 4/172]
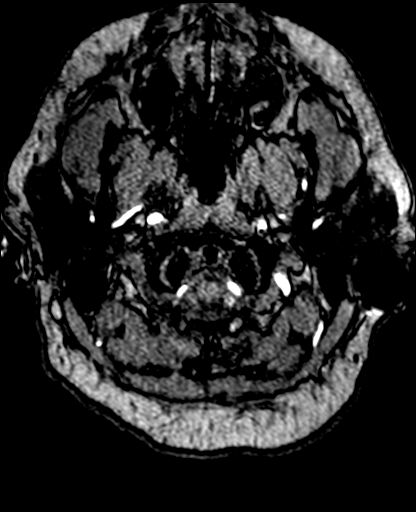
[im 8/172]
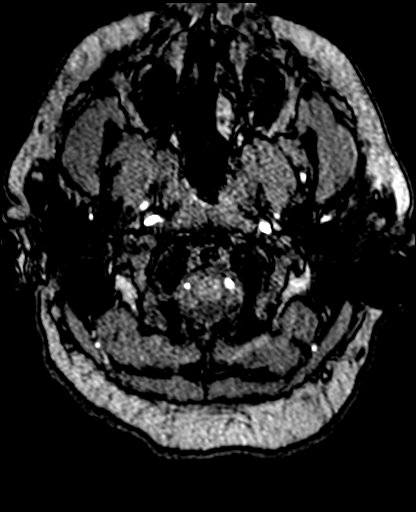
[im 11/172]
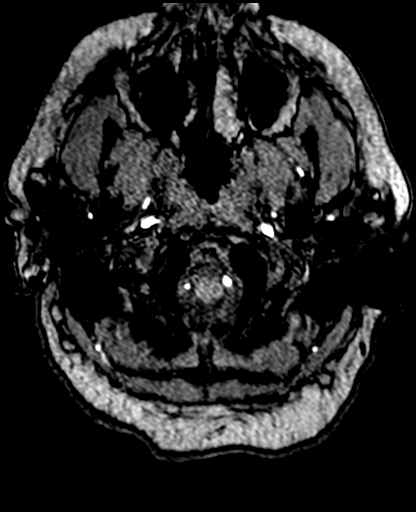
[im 15/172]
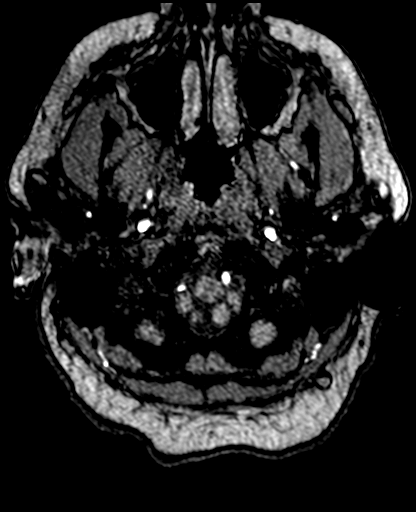
[im 19/172]
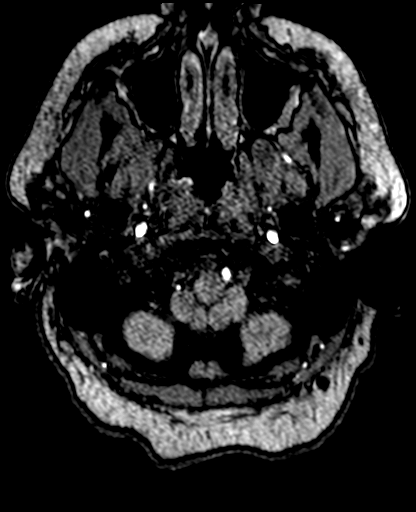
[im 22/172]
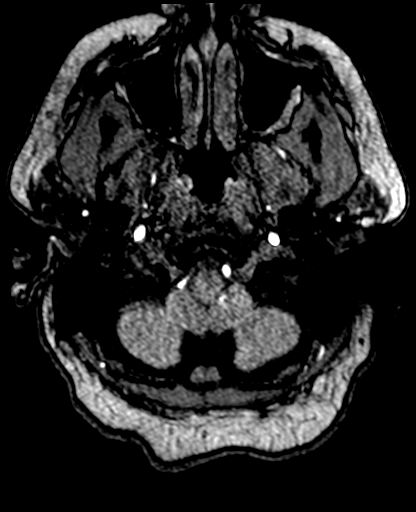
[im 26/172]
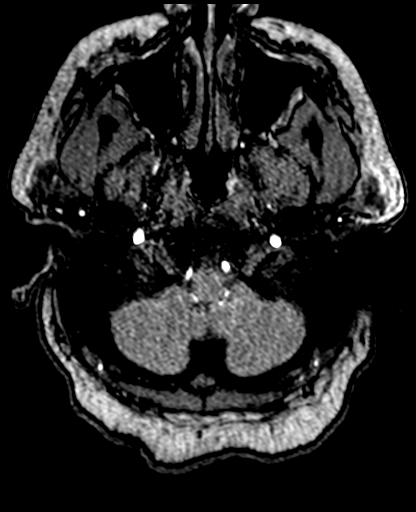
[im 30/172]
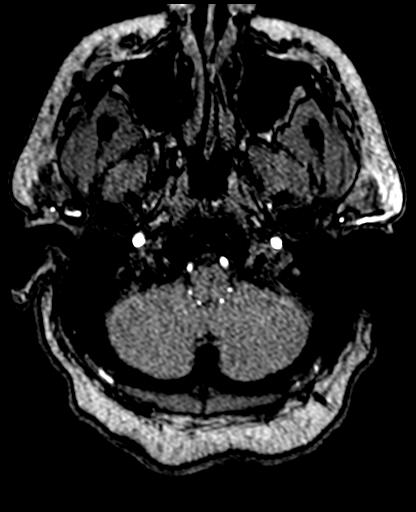
[im 33/172]
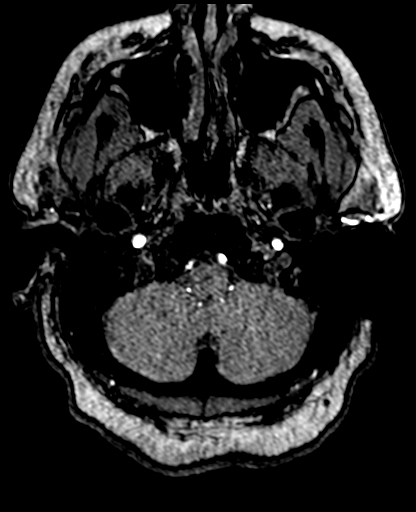
[im 37/172]
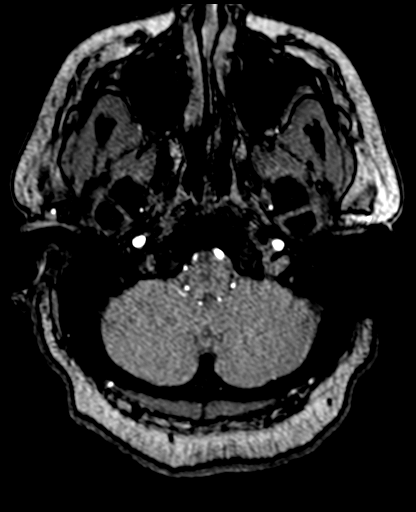
[im 41/172]
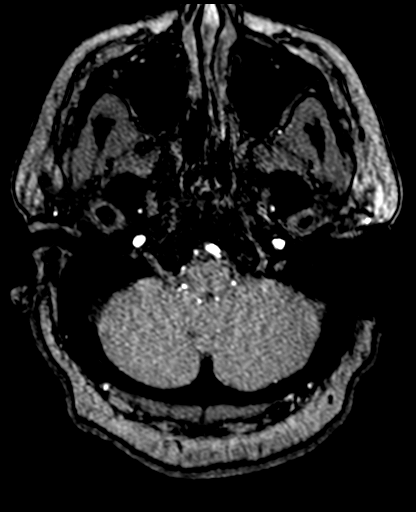
[im 44/172]
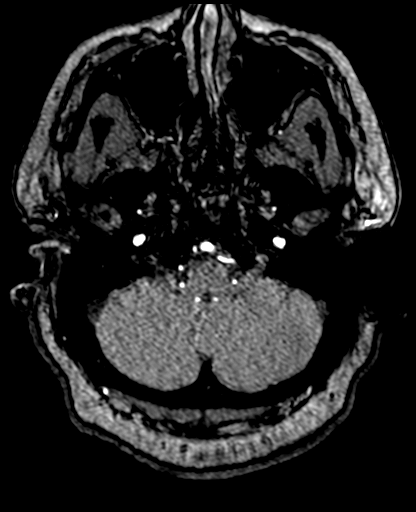
[im 48/172]
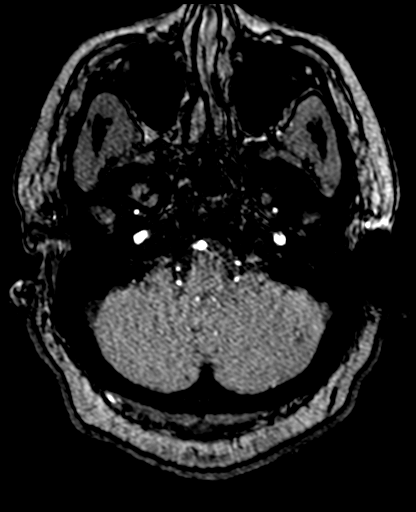
[im 51/172]
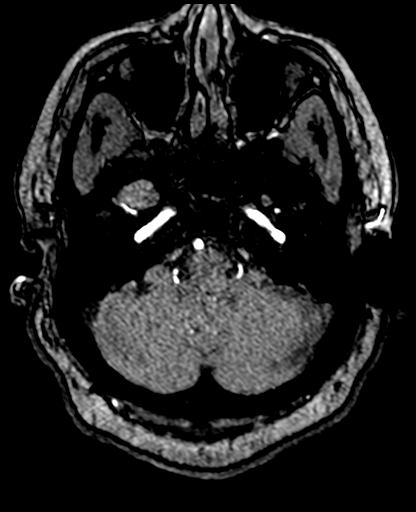
[im 55/172]
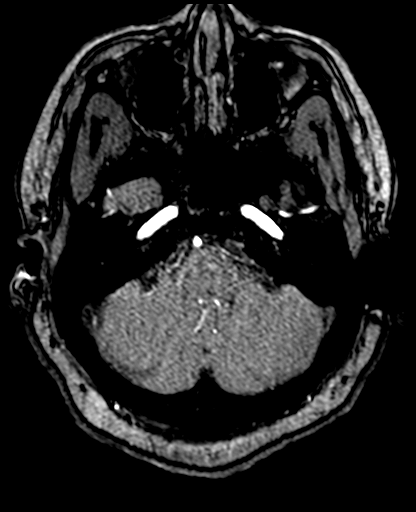
[im 77/172]
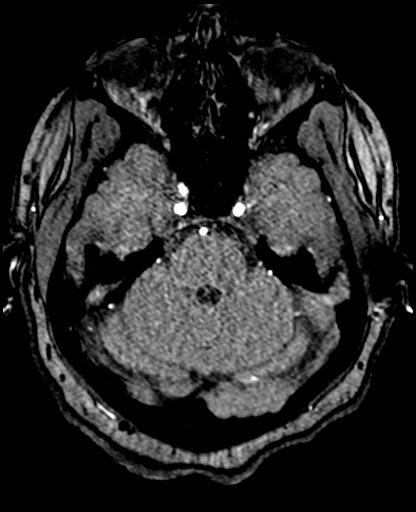
[im 88/172]
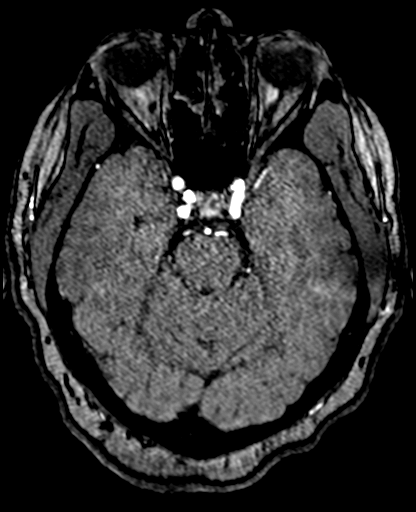
[im 99/172]
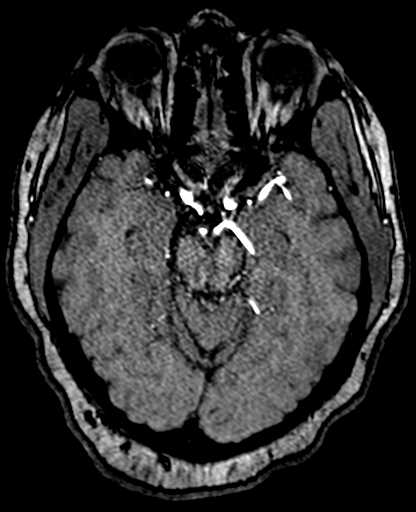
[im 121/172]
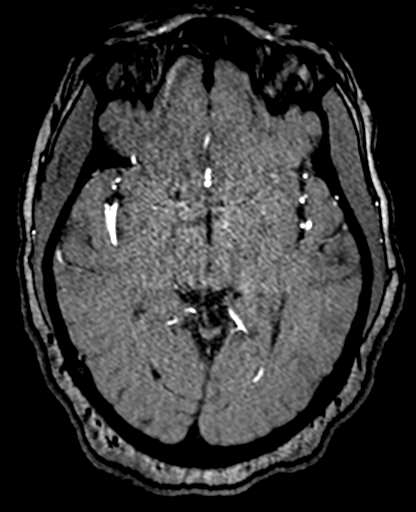
[im 142/172]
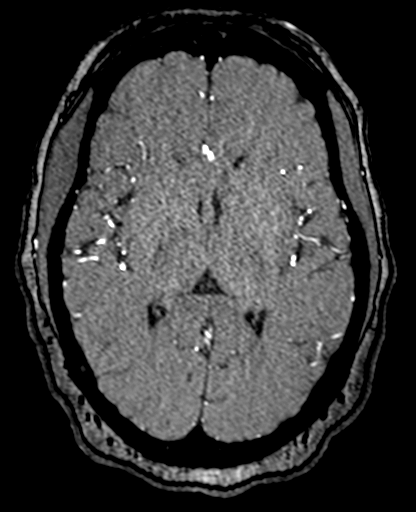
[im 146/172]
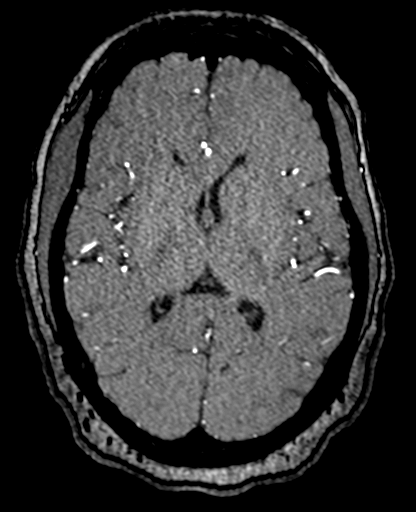
[im 164/172]
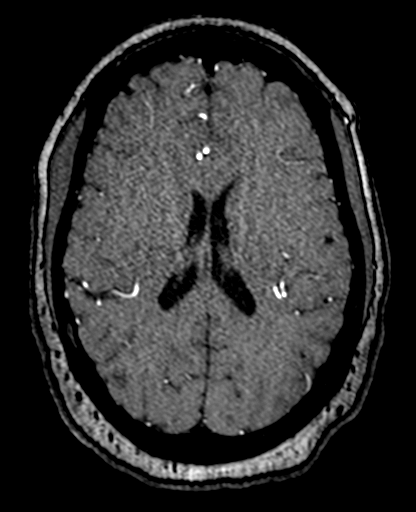

[23 of 48 positions shown; findings below may reference images not displayed]

FINDINGS: MRI HEAD FINDINGS

Brain: The brain has a normal appearance without evidence of
malformation, atrophy, old or acute small or large vessel
infarction, mass lesion, hemorrhage, hydrocephalus or extra-axial
collection.

Vascular: Major vessels at the base of the brain show flow. Venous
sinuses appear patent.

Skull and upper cervical spine: Normal.

Sinuses/Orbits: Clear/normal.

Other: None significant.

MRA HEAD FINDINGS

Both internal carotid arteries are widely patent into the brain. No
siphon stenosis. The anterior and middle cerebral vessels are patent
without proximal stenosis, aneurysm or vascular malformation.

Both vertebral arteries are widely patent to the basilar. No basilar
stenosis. Posterior circulation branch vessels appear normal.

MRA NECK FINDINGS

Branching pattern is normal. Both common carotid arteries widely
patent to the bifurcation. Both carotid bifurcations are normal.
Both cervical internal carotid arteries are normal.

Both vertebral arteries are patent and show antegrade flow. The left
vertebral artery is dominant. Postcontrast imaging shows a large
amount of venous signal adjacent to the small right vertebral
artery, making evaluation difficulty using that technique.
Noncontrast MR angiography of the neck vessels has a normal
appearance. If there is further concern about potential right
vertebral artery pathology, CT angiography could be considered, but
my degree of suspicion is quite low, particularly based on the
noncontrast imaging.
IMPRESSION: 1. Normal MRI of the brain.
2. Normal MR angiography of the neck vessels. Evaluation of the
right vertebral artery is somewhat suboptimal due to prominent
venous contrast. Based on the normal appearance of the noncontrast
exam, I do not have any significant suspicion regarding right
vertebral pathology. If concern persists specifically regarding
right vertebral pathology, CT angiography could be considered.
3. Normal intracranial MR angiography.

## 2020-05-05 MED ORDER — METOCLOPRAMIDE HCL 5 MG/ML IJ SOLN
10.0000 mg | Freq: Once | INTRAMUSCULAR | Status: AC
  Administered 2020-05-05: 21:00:00 10 mg via INTRAVENOUS
  Filled 2020-05-05: qty 2

## 2020-05-05 MED ORDER — ASPIRIN 81 MG PO CHEW
CHEWABLE_TABLET | ORAL | Status: AC
  Filled 2020-05-05: qty 1

## 2020-05-05 MED ORDER — DEXAMETHASONE SODIUM PHOSPHATE 10 MG/ML IJ SOLN
10.0000 mg | Freq: Once | INTRAMUSCULAR | Status: AC
  Administered 2020-05-05: 21:00:00 10 mg via INTRAVENOUS
  Filled 2020-05-05: qty 1

## 2020-05-05 MED ORDER — LORAZEPAM 2 MG/ML IJ SOLN
1.0000 mg | Freq: Once | INTRAMUSCULAR | Status: AC
  Administered 2020-05-05: 20:00:00 1 mg via INTRAVENOUS
  Filled 2020-05-05: qty 1

## 2020-05-05 MED ORDER — GADOBUTROL 1 MMOL/ML IV SOLN
10.0000 mL | Freq: Once | INTRAVENOUS | Status: AC | PRN
  Administered 2020-05-05: 20:00:00 10 mL via INTRAVENOUS

## 2020-05-05 MED ORDER — ASPIRIN 81 MG PO CHEW
324.0000 mg | CHEWABLE_TABLET | Freq: Once | ORAL | Status: AC
  Administered 2020-05-05: 15:00:00 324 mg via ORAL
  Filled 2020-05-05: qty 4

## 2020-05-05 MED ORDER — KETOROLAC TROMETHAMINE 15 MG/ML IJ SOLN
15.0000 mg | Freq: Once | INTRAMUSCULAR | Status: AC
  Administered 2020-05-05: 21:00:00 15 mg via INTRAVENOUS
  Filled 2020-05-05: qty 1

## 2020-05-05 MED ORDER — DIPHENHYDRAMINE HCL 50 MG/ML IJ SOLN
12.5000 mg | Freq: Once | INTRAMUSCULAR | Status: AC
  Administered 2020-05-05: 21:00:00 12.5 mg via INTRAVENOUS
  Filled 2020-05-05: qty 1

## 2020-05-05 NOTE — ED Notes (Signed)
Pt aware urine specimen ordered. Pt reports inability to provide specimen at this time. Specimen collection device provided to patient. 

## 2020-05-05 NOTE — ED Notes (Signed)
Patient verbalizes understanding of discharge instructions. Opportunity for questioning and answers were provided. Armband removed by staff, pt discharged from ED in wheelchair to home.   

## 2020-05-05 NOTE — ED Notes (Signed)
Pt currently in MRI. Physical assessment and VS reassessment will be performed upon patients return.

## 2020-05-05 NOTE — ED Notes (Signed)
Pt returned from CT °

## 2020-05-05 NOTE — ED Notes (Addendum)
Pt endorses HA at bedtime. Reports feeling numbness and HA when awakening around 0500 today. Pt denies CP, endorses blurred vision that occurs with HA. Speech clear, reports "sometimes I stutter". PCP last year

## 2020-05-05 NOTE — ED Notes (Signed)
ED Provider at bedside. 

## 2020-05-05 NOTE — ED Notes (Signed)
Patient transported to CT 

## 2020-05-05 NOTE — ED Triage Notes (Signed)
Woke up today with L arm numbness. LKW 2:45am. Reports headache and high BP reading for several days. She has been out of HTN meds for several months.

## 2020-05-05 NOTE — ED Notes (Addendum)
Patient to MRI NOW

## 2020-05-05 NOTE — ED Notes (Signed)
Pt to triage via [facility] from [facility] for MRI.

## 2020-05-05 NOTE — ED Provider Notes (Signed)
[facility] EMERGENCY DEPARTMENT Provider Note   CSN: [id number] Arrival date & time: 05/05/20  1112     History Chief Complaint  Patient presents with  . Numbness    Carmen [name] is a 39 y.o. female.  39 year old female with past medical history below including hypertension, OSA, migraines, morbid obesity who presents with left-sided numbness.  Patient woke up this morning at 2:45 AM to go to the bathroom and was in her usual state of health.  She woke up again at 5 AM and at that time and noted left-sided numbness involving entire left arm and left leg.  Symptoms have been constant throughout the day.  No associated focal weakness.  She has had intermittent headaches throughout the week and occasional associated blurry vision but no double vision.  She denies any vomiting, fevers, or recent illness.  She has noted some elevated blood pressures, has been off of blood pressure medication for a long time.  Denies any personal history of stroke.  No history of stroke in immediate family members. Last HA last night around bedtime.  The history is provided by the patient.       Past Medical History:  Diagnosis Date  . Anemia   . Bilateral carpal tunnel syndrome   . Essential hypertension   . Fibroids    Uterine unspecified  . Genital herpes   . Heart murmur   . History of migraine headaches    Seen By Dr. Venia Minks  . IBS (irritable bowel syndrome)   . Migraine 07/11/2014  . Morbid obesity ([place])   . OSA (obstructive sleep apnea)     Patient Active Problem List   Diagnosis Date Noted  . Somnolence, daytime 11/30/2014  . Migraine 07/11/2014  . Anemia 06/27/2013  . Vaginitis and vulvovaginitis, unspecified 06/27/2013  . Obesity 03/09/2013  . Genital herpes, unspecified 09/06/2012  . Leiomyoma of uterus, unspecified 09/06/2012    Past Surgical History:  Procedure Laterality Date  . CARPAL TUNNEL RELEASE Right   . CESAREAN SECTION      x 2  .  WISDOM TOOTH EXTRACTION       OB History    Gravida  5   Para  2   Term  2   Preterm  0   AB  3   Living  2     SAB  3   IAB  0   Ectopic  0   Multiple  0   Live Births              Family History  Problem Relation Age of Onset  . Breast cancer Mother   . Hypertension Mother   . Hyperlipidemia Mother   . Hypertension Brother   . Hypertension Father   . Hyperlipidemia Father   . Diabetes Other   . Hypercholesterolemia Other   . Gout Other   . Congestive Heart Failure Other   . Alzheimer's disease Other   . Heart attack Other   . Ovarian cancer Other        Aunt  . Stroke Maternal Grandmother   . Kidney disease Maternal Grandmother   . Heart disease Maternal Grandmother   . Diabetes Paternal Grandmother   . Breast cancer Maternal Aunt   . Heart disease Maternal Grandfather   . Hypertension Maternal Grandfather   . Breast cancer Maternal Aunt   . Migraines Neg Hx     Social History   Tobacco Use  . Smoking status: Never  Smoker  . Smokeless tobacco: Never Used  Substance Use Topics  . Alcohol use: No    Alcohol/week: 0.0 standard drinks  . Drug use: No    Home Medications Prior to Admission medications   Medication Sig Start Date End Date Taking? Authorizing Provider  cephALEXin (KEFLEX) 500 MG capsule Take 1 capsule (500 mg total) by mouth 4 (four) times daily. 12/12/18   Raylene Everts, MD  cetirizine (ZYRTEC) 10 MG tablet Take 1 tablet (10 mg total) by mouth daily. 08/04/18   Scot Jun, FNP  hydrochlorothiazide (HYDRODIURIL) 25 MG tablet Take 1 tablet (25 mg total) by mouth daily. 08/04/18   Scot Jun, FNP  ibuprofen (ADVIL) 800 MG tablet Take 1 tablet (800 mg total) by mouth 3 (three) times daily. 12/12/18   Raylene Everts, MD  ipratropium (ATROVENT) 0.06 % nasal spray Place 2 sprays into both nostrils 4 (four) times daily. 05/17/18   Ok Edwards, PA-C  levonorgestrel (MIRENA) 20 MCG/24HR IUD 1 each by Intrauterine  route once. Implanted December 2014    [provider]  Multiple Vitamin (MULTIVITAMIN WITH MINERALS) TABS tablet Take 1 tablet by mouth daily.    [provider]  tiZANidine (ZANAFLEX) 4 MG tablet Take 1 tablet (4 mg total) by mouth every 6 (six) hours as needed for muscle spasms. 12/12/18   Raylene Everts, MD  valACYclovir (VALTREX) 500 MG tablet TAKE 1 TABLET (500 MG) BY MOUTH TWICE DAILY FOR 5 DAYS AS NEEDED FOR OUTBREAKS 02/26/20   Shelly Bombard, MD  dicyclomine (BENTYL) 20 MG tablet Take 1 tablet (20 mg total) by mouth 2 (two) times daily. 07/20/18 12/12/18  Ok Edwards, PA-C  diltiazem (CARDIZEM CD) 120 MG 24 hr capsule Take 1 capsule (120 mg total) by mouth daily. 08/04/18 12/12/18  Scot Jun, FNP    Allergies    Patient has no known allergies.  Review of Systems   Review of Systems All other systems reviewed and are negative except that which was mentioned in HPI  Physical Exam Updated Vital Signs BP 140/84 (BP Location: Right Arm)   Pulse 72   Temp 98.6 F (37 C) (Oral)   Resp 14   Ht 5\' 6"  (1.676 m)   Wt 123.8 kg   SpO2 98%   BMI 44.06 kg/m   Physical Exam Vitals and nursing note reviewed.  Constitutional:      General: She is not in acute distress.    Appearance: She is well-developed.     Comments: Awake, alert  HENT:     Head: Normocephalic and atraumatic.  Eyes:     Extraocular Movements: Extraocular movements intact.     Conjunctiva/sclera: Conjunctivae normal.     Pupils: Pupils are equal, round, and reactive to light.  Cardiovascular:     Rate and Rhythm: Normal rate and regular rhythm.     Heart sounds: Normal heart sounds. No murmur heard.   Pulmonary:     Effort: Pulmonary effort is normal. No respiratory distress.     Breath sounds: Normal breath sounds.  Abdominal:     General: Bowel sounds are normal. There is no distension.     Palpations: Abdomen is soft.     Tenderness: There is no abdominal tenderness.   Musculoskeletal:     Cervical back: Neck supple.  Skin:    General: Skin is warm and dry.  Neurological:     Mental Status: She is alert and oriented to person, place,  and time.     Cranial Nerves: No cranial nerve deficit.     Motor: No abnormal muscle tone.     Coordination: Coordination normal.     Deep Tendon Reflexes: Reflexes are normal and symmetric.     Comments: Fluent speech, normal finger-to-nose testing, negative pronator drift, no clonus 5/5 strength x all 4 extremities Mildly decreased sensation LUE, LLE compared to R  Psychiatric:        Thought Content: Thought content normal.        Judgment: Judgment normal.     ED Results / Procedures / Treatments   Labs (all labs ordered are listed, but only abnormal results are displayed) Labs Reviewed  DIFFERENTIAL - Abnormal; Notable for the following components:      Result Value   Eosinophils Absolute 0.6 (*)    All other components within normal limits  COMPREHENSIVE METABOLIC PANEL - Abnormal; Notable for the following components:   Glucose, Bld 133 (*)    Total Protein 8.4 (*)    AST 13 (*)    All other components within normal limits  CBG MONITORING, ED - Abnormal; Notable for the following components:   Glucose-Capillary 124 (*)    All other components within normal limits  PROTIME-INR  APTT  CBC  PREGNANCY, URINE  TROPONIN I (HIGH SENSITIVITY)  TROPONIN I (HIGH SENSITIVITY)    EKG EKG Interpretation  Date/Time:  Sunday May 05 2020 11:31:26 EST Ventricular Rate:  68 PR Interval:  148 QRS Duration: 100 QT Interval:  408 QTC Calculation: 433 R Axis:   -67 Text Interpretation: Normal sinus rhythm Left axis deviation Pulmonary disease pattern Nonspecific T wave abnormality Abnormal ECG T wave inversions inferiorly are similar to previous tracing Confirmed by Theotis Burrow [id number]) on 05/05/2020 12:04:49 PM   Radiology CT HEAD WO CONTRAST  Result Date: 05/05/2020 CLINICAL DATA:  Worsening  headache over the past week. EXAM: CT HEAD WITHOUT CONTRAST TECHNIQUE: Contiguous axial images were obtained from the base of the skull through the vertex without intravenous contrast. COMPARISON:  None. FINDINGS: Brain: No evidence of acute infarction, hemorrhage, hydrocephalus, extra-axial collection or mass lesion/mass effect. Vascular: No hyperdense vessel or unexpected calcification. Skull: Intact.  No focal lesion. Sinuses/Orbits: Negative. Other: None. IMPRESSION: Negative head CT. Electronically Signed   By: Inge Rise M.D.   On: 05/05/2020 12:27    Procedures Procedures (including critical care time)  Medications Ordered in ED Medications  aspirin chewable tablet 324 mg (324 mg Oral Given 05/05/20 1520)    ED Course  I have reviewed the triage vital signs and the nursing notes.  Pertinent labs & imaging results that were available during my care of the patient were reviewed by me and considered in my medical decision making (see chart for details).    MDM Rules/Calculators/A&P                          Pt endorsing L sided diminished fine touch sensation arm and leg but otherwise reassuring neuro exam. Outside of tPA window therefore did not activate code stroke. PT initially severely hypertensive but this did improve over time without intervention. Sx do not directly correlate with her headaches to suggest complex migraine. Head CT negative acute, labs reassuring. Discussed w/ neuro, Dr. Theda Sers, who recommended ASA 324mg  now, initiation of 81mg  ASA and plavix daily. Also recommended MRI/MRA and if negative, pt can be discharge with close PCP f/u. Discussed this plan with patient. Discussed  ED to ED transfer for MR imaging w/ Dr. Wyvonnia Dusky.  Final Clinical Impression(s) / ED Diagnoses Final diagnoses:  Left sided numbness  Primary hypertension    Rx / DC Orders ED Discharge Orders    None       Anijah Spohr, Wenda Overland, MD 05/05/20 1611

## 2020-05-05 NOTE — ED Notes (Signed)
CBG 124 

## 2020-05-05 NOTE — ED Notes (Signed)
Tried calling [place] charge RN at [phone] no answer. Called direct ED line at [phone] and was transferred to charge phone. Charge number no answer. Called back to direct line [phone] to be transferred to RN 1st, no answer. Directed back to secretary who then forwarded call to Charge RN, no answer.

## 2020-05-05 NOTE — ED Notes (Signed)
Care handoff to [place] at [facility]. NR (call diverts to VM, no way to leave message) when attempting to give handoff to ED Charge RN [phone].

## 2020-05-05 NOTE — ED Notes (Signed)
Pt drove to ED has 2 children. Pt reports inability to have children picked up

## 2020-05-05 NOTE — ED Notes (Signed)
MRI called and will send for pt.

## 2020-05-05 NOTE — ED Notes (Signed)
PT is currently in MRI and MRI notified to bring patient to [facility] after completion.

## 2020-05-05 NOTE — Discharge Instructions (Signed)
Your MRI imaging and workup in the ER was reassuring today and did not show signs of stroke.  I suspect you may have a complex migraine, or a headache that causes numbness or neurological symptoms.  I placed a referral for you to follow up with neurology for your headaches.  I expect your symptoms to improve as your headache improves over the next day or two.    If you have suddenly severely worsening headache, blurred vision or loss of vision, slurred speech or difficulty speaking, or feel like your arm or leg is weak or dragging, you should return to the ER.

## 2020-05-05 NOTE — ED Provider Notes (Signed)
[facility] Provider Note   CSN: [id number] Arrival date & time: 05/05/20  1112     History Chief Complaint  Patient presents with  . Numbness    Carmen [name] is a 39 y.o. female w/ hx of migraines, obesity, here with left sided numbness from this morning. This involves left arm and left leg.  No speech difficulty, or LOC.  Headaches on and off throughout the day.     Patient seen at [facility] earlier today with numbness, had unremarkable CTH, and after consultation with neurology, was transferred to [facility] for MRI/MRA imaging to complete stroke assessment.  HPI     Past Medical History:  Diagnosis Date  . Anemia   . Bilateral carpal tunnel syndrome   . Essential hypertension   . Fibroids    Uterine unspecified  . Genital herpes   . Heart murmur   . History of migraine headaches    Seen By Dr. Venia Minks  . IBS (irritable bowel syndrome)   . Migraine 07/11/2014  . Morbid obesity ([place])   . OSA (obstructive sleep apnea)     Patient Active Problem List   Diagnosis Date Noted  . Somnolence, daytime 11/30/2014  . Migraine 07/11/2014  . Anemia 06/27/2013  . Vaginitis and vulvovaginitis, unspecified 06/27/2013  . Obesity 03/09/2013  . Genital herpes, unspecified 09/06/2012  . Leiomyoma of uterus, unspecified 09/06/2012    Past Surgical History:  Procedure Laterality Date  . CARPAL TUNNEL RELEASE Right   . CESAREAN SECTION      x 2  . WISDOM TOOTH EXTRACTION       OB History    Gravida  5   Para  2   Term  2   Preterm  0   AB  3   Living  2     SAB  3   IAB  0   Ectopic  0   Multiple  0   Live Births              Family History  Problem Relation Age of Onset  . Breast cancer Mother   . Hypertension Mother   . Hyperlipidemia Mother   . Hypertension Brother   . Hypertension Father   . Hyperlipidemia Father   . Diabetes Other   . Hypercholesterolemia Other   . Gout Other    . Congestive Heart Failure Other   . Alzheimer's disease Other   . Heart attack Other   . Ovarian cancer Other        Aunt  . Stroke Maternal Grandmother   . Kidney disease Maternal Grandmother   . Heart disease Maternal Grandmother   . Diabetes Paternal Grandmother   . Breast cancer Maternal Aunt   . Heart disease Maternal Grandfather   . Hypertension Maternal Grandfather   . Breast cancer Maternal Aunt   . Migraines Neg Hx     Social History   Tobacco Use  . Smoking status: Never Smoker  . Smokeless tobacco: Never Used  Substance Use Topics  . Alcohol use: No    Alcohol/week: 0.0 standard drinks  . Drug use: No    Home Medications Prior to Admission medications   Medication Sig Start Date End Date Taking? Authorizing Provider  cephALEXin (KEFLEX) 500 MG capsule Take 1 capsule (500 mg total) by mouth 4 (four) times daily. 12/12/18   Raylene Everts, MD  cetirizine (ZYRTEC) 10 MG tablet Take 1 tablet (10 mg total)  by mouth daily. 08/04/18   Scot Jun, FNP  hydrochlorothiazide (HYDRODIURIL) 25 MG tablet Take 1 tablet (25 mg total) by mouth daily. 08/04/18   Scot Jun, FNP  ibuprofen (ADVIL) 800 MG tablet Take 1 tablet (800 mg total) by mouth 3 (three) times daily. 12/12/18   Raylene Everts, MD  ipratropium (ATROVENT) 0.06 % nasal spray Place 2 sprays into both nostrils 4 (four) times daily. 05/17/18   Ok Edwards, PA-C  levonorgestrel (MIRENA) 20 MCG/24HR IUD 1 each by Intrauterine route once. Implanted December 2014    [provider]  Multiple Vitamin (MULTIVITAMIN WITH MINERALS) TABS tablet Take 1 tablet by mouth daily.    [provider]  tiZANidine (ZANAFLEX) 4 MG tablet Take 1 tablet (4 mg total) by mouth every 6 (six) hours as needed for muscle spasms. 12/12/18   Raylene Everts, MD  valACYclovir (VALTREX) 500 MG tablet TAKE 1 TABLET (500 MG) BY MOUTH TWICE DAILY FOR 5 DAYS AS NEEDED FOR OUTBREAKS 02/26/20   Shelly Bombard, MD   dicyclomine (BENTYL) 20 MG tablet Take 1 tablet (20 mg total) by mouth 2 (two) times daily. 07/20/18 12/12/18  Ok Edwards, PA-C  diltiazem (CARDIZEM CD) 120 MG 24 hr capsule Take 1 capsule (120 mg total) by mouth daily. 08/04/18 12/12/18  Scot Jun, FNP    Allergies    Patient has no known allergies.  Review of Systems   Review of Systems  Constitutional: Negative for chills and fever.  Eyes: Negative for pain and visual disturbance.  Respiratory: Negative for cough and shortness of breath.   Cardiovascular: Negative for chest pain and palpitations.  Gastrointestinal: Negative for abdominal pain and vomiting.  Genitourinary: Negative for dysuria and hematuria.  Musculoskeletal: Negative for arthralgias and back pain.  Skin: Negative for color change and rash.  Neurological: Positive for numbness and headaches. Negative for syncope.  Psychiatric/Behavioral: Negative for agitation and confusion.  All other systems reviewed and are negative.   Physical Exam Updated Vital Signs BP 138/79   Pulse 63   Temp 97.9 F (36.6 C) (Oral)   Resp 18   Ht 5\' 6"  (1.676 m)   Wt 123.8 kg   SpO2 99%   BMI 44.06 kg/m   Physical Exam Vitals and nursing note reviewed.  Constitutional:      General: She is not in acute distress.    Appearance: She is well-developed and well-nourished. She is obese.  HENT:     Head: Normocephalic and atraumatic.  Eyes:     Conjunctiva/sclera: Conjunctivae normal.  Cardiovascular:     Rate and Rhythm: Normal rate and regular rhythm.     Heart sounds: No murmur heard.   Pulmonary:     Effort: Pulmonary effort is normal. No respiratory distress.     Breath sounds: Normal breath sounds.  Abdominal:     Palpations: Abdomen is soft.     Tenderness: There is no abdominal tenderness.  Musculoskeletal:        General: No edema.     Cervical back: Neck supple.  Skin:    General: Skin is warm and dry.  Neurological:     General: No focal deficit  present.     Mental Status: She is alert and oriented to person, place, and time.     GCS: GCS eye subscore is 4. GCS verbal subscore is 5. GCS motor subscore is 6.     Cranial Nerves: Cranial nerves are intact.  Motor: Motor function is intact.  Psychiatric:        Mood and Affect: Mood and affect and mood normal.        Behavior: Behavior normal.     ED Results / Procedures / Treatments   Labs (all labs ordered are listed, but only abnormal results are displayed) Labs Reviewed  DIFFERENTIAL - Abnormal; Notable for the following components:      Result Value   Eosinophils Absolute 0.6 (*)    All other components within normal limits  COMPREHENSIVE METABOLIC PANEL - Abnormal; Notable for the following components:   Glucose, Bld 133 (*)    Total Protein 8.4 (*)    AST 13 (*)    All other components within normal limits  CBG MONITORING, ED - Abnormal; Notable for the following components:   Glucose-Capillary 124 (*)    All other components within normal limits  PROTIME-INR  APTT  CBC  PREGNANCY, URINE  TROPONIN I (HIGH SENSITIVITY)  TROPONIN I (HIGH SENSITIVITY)    EKG EKG Interpretation  Date/Time:  Sunday May 05 2020 11:31:26 EST Ventricular Rate:  68 PR Interval:  148 QRS Duration: 100 QT Interval:  408 QTC Calculation: 433 R Axis:   -67 Text Interpretation: Normal sinus rhythm Left axis deviation Pulmonary disease pattern Nonspecific T wave abnormality Abnormal ECG T wave inversions inferiorly are similar to previous tracing Confirmed by Theotis Burrow [phone]) on 05/05/2020 12:04:49 PM   Radiology CT HEAD WO CONTRAST  Result Date: 05/05/2020 CLINICAL DATA:  Worsening headache over the past week. EXAM: CT HEAD WITHOUT CONTRAST TECHNIQUE: Contiguous axial images were obtained from the base of the skull through the vertex without intravenous contrast. COMPARISON:  None. FINDINGS: Brain: No evidence of acute infarction, hemorrhage, hydrocephalus, extra-axial  collection or mass lesion/mass effect. Vascular: No hyperdense vessel or unexpected calcification. Skull: Intact.  No focal lesion. Sinuses/Orbits: Negative. Other: None. IMPRESSION: Negative head CT. Electronically Signed   By: Inge Rise M.D.   On: 05/05/2020 12:27   MR ANGIO HEAD WO CONTRAST  Result Date: 05/05/2020 CLINICAL DATA:  Acute presentation with worsening headache. Question stroke. EXAM: MRI HEAD WITHOUT  CONTRAST MRA HEAD WITHOUT CONTRAST MRA NECK WITHOUT AND WITH CONTRAST TECHNIQUE: Multiplanar, multiecho pulse sequences of the brain and surrounding structures were obtained without intravenous contrast. Angiographic images of the Circle of Willis were obtained using MRA technique without intravenous contrast. Angiographic images of the neck were obtained using MRA technique without and with intravenous contrast. Carotid stenosis measurements (when applicable) are obtained utilizing NASCET criteria, using the distal internal carotid diameter as the denominator. CONTRAST:  1mL GADAVIST GADOBUTROL 1 MMOL/ML IV SOLN COMPARISON:  Head CT earlier same day FINDINGS: MRI HEAD FINDINGS Brain: The brain has a normal appearance without evidence of malformation, atrophy, old or acute small or large vessel infarction, mass lesion, hemorrhage, hydrocephalus or extra-axial collection. Vascular: Major vessels at the base of the brain show flow. Venous sinuses appear patent. Skull and upper cervical spine: Normal. Sinuses/Orbits: Clear/normal. Other: None significant. MRA HEAD FINDINGS Both internal carotid arteries are widely patent into the brain. No siphon stenosis. The anterior and middle cerebral vessels are patent without proximal stenosis, aneurysm or vascular malformation. Both vertebral arteries are widely patent to the basilar. No basilar stenosis. Posterior circulation branch vessels appear normal. MRA NECK FINDINGS Branching pattern is normal. Both common carotid arteries widely patent to the  bifurcation. Both carotid bifurcations are normal. Both cervical internal carotid arteries are normal. Both vertebral arteries are  patent and show antegrade flow. The left vertebral artery is dominant. Postcontrast imaging shows a large amount of venous signal adjacent to the small right vertebral artery, making evaluation difficulty using that technique. Noncontrast MR angiography of the neck vessels has a normal appearance. If there is further concern about potential right vertebral artery pathology, CT angiography could be considered, but my degree of suspicion is quite low, particularly based on the noncontrast imaging. IMPRESSION: 1. Normal MRI of the brain. 2. Normal MR angiography of the neck vessels. Evaluation of the right vertebral artery is somewhat suboptimal due to prominent venous contrast. Based on the normal appearance of the noncontrast exam, I do not have any significant suspicion regarding right vertebral pathology. If concern persists specifically regarding right vertebral pathology, CT angiography could be considered. 3. Normal intracranial MR angiography. Electronically Signed   By: Nelson Chimes M.D.   On: 05/05/2020 20:52   MR Angiogram Neck W or Wo Contrast  Result Date: 05/05/2020 CLINICAL DATA:  Acute presentation with worsening headache. Question stroke. EXAM: MRI HEAD WITHOUT  CONTRAST MRA HEAD WITHOUT CONTRAST MRA NECK WITHOUT AND WITH CONTRAST TECHNIQUE: Multiplanar, multiecho pulse sequences of the brain and surrounding structures were obtained without intravenous contrast. Angiographic images of the Circle of Willis were obtained using MRA technique without intravenous contrast. Angiographic images of the neck were obtained using MRA technique without and with intravenous contrast. Carotid stenosis measurements (when applicable) are obtained utilizing NASCET criteria, using the distal internal carotid diameter as the denominator. CONTRAST:  7mL GADAVIST GADOBUTROL 1 MMOL/ML IV  SOLN COMPARISON:  Head CT earlier same day FINDINGS: MRI HEAD FINDINGS Brain: The brain has a normal appearance without evidence of malformation, atrophy, old or acute small or large vessel infarction, mass lesion, hemorrhage, hydrocephalus or extra-axial collection. Vascular: Major vessels at the base of the brain show flow. Venous sinuses appear patent. Skull and upper cervical spine: Normal. Sinuses/Orbits: Clear/normal. Other: None significant. MRA HEAD FINDINGS Both internal carotid arteries are widely patent into the brain. No siphon stenosis. The anterior and middle cerebral vessels are patent without proximal stenosis, aneurysm or vascular malformation. Both vertebral arteries are widely patent to the basilar. No basilar stenosis. Posterior circulation branch vessels appear normal. MRA NECK FINDINGS Branching pattern is normal. Both common carotid arteries widely patent to the bifurcation. Both carotid bifurcations are normal. Both cervical internal carotid arteries are normal. Both vertebral arteries are patent and show antegrade flow. The left vertebral artery is dominant. Postcontrast imaging shows a large amount of venous signal adjacent to the small right vertebral artery, making evaluation difficulty using that technique. Noncontrast MR angiography of the neck vessels has a normal appearance. If there is further concern about potential right vertebral artery pathology, CT angiography could be considered, but my degree of suspicion is quite low, particularly based on the noncontrast imaging. IMPRESSION: 1. Normal MRI of the brain. 2. Normal MR angiography of the neck vessels. Evaluation of the right vertebral artery is somewhat suboptimal due to prominent venous contrast. Based on the normal appearance of the noncontrast exam, I do not have any significant suspicion regarding right vertebral pathology. If concern persists specifically regarding right vertebral pathology, CT angiography could be  considered. 3. Normal intracranial MR angiography. Electronically Signed   By: Nelson Chimes M.D.   On: 05/05/2020 20:52   MR Brain Wo Contrast (neuro protocol)  Result Date: 05/05/2020 CLINICAL DATA:  Acute presentation with worsening headache. Question stroke. EXAM: MRI HEAD WITHOUT  CONTRAST MRA HEAD  WITHOUT CONTRAST MRA NECK WITHOUT AND WITH CONTRAST TECHNIQUE: Multiplanar, multiecho pulse sequences of the brain and surrounding structures were obtained without intravenous contrast. Angiographic images of the Circle of Willis were obtained using MRA technique without intravenous contrast. Angiographic images of the neck were obtained using MRA technique without and with intravenous contrast. Carotid stenosis measurements (when applicable) are obtained utilizing NASCET criteria, using the distal internal carotid diameter as the denominator. CONTRAST:  37mL GADAVIST GADOBUTROL 1 MMOL/ML IV SOLN COMPARISON:  Head CT earlier same day FINDINGS: MRI HEAD FINDINGS Brain: The brain has a normal appearance without evidence of malformation, atrophy, old or acute small or large vessel infarction, mass lesion, hemorrhage, hydrocephalus or extra-axial collection. Vascular: Major vessels at the base of the brain show flow. Venous sinuses appear patent. Skull and upper cervical spine: Normal. Sinuses/Orbits: Clear/normal. Other: None significant. MRA HEAD FINDINGS Both internal carotid arteries are widely patent into the brain. No siphon stenosis. The anterior and middle cerebral vessels are patent without proximal stenosis, aneurysm or vascular malformation. Both vertebral arteries are widely patent to the basilar. No basilar stenosis. Posterior circulation branch vessels appear normal. MRA NECK FINDINGS Branching pattern is normal. Both common carotid arteries widely patent to the bifurcation. Both carotid bifurcations are normal. Both cervical internal carotid arteries are normal. Both vertebral arteries are patent and  show antegrade flow. The left vertebral artery is dominant. Postcontrast imaging shows a large amount of venous signal adjacent to the small right vertebral artery, making evaluation difficulty using that technique. Noncontrast MR angiography of the neck vessels has a normal appearance. If there is further concern about potential right vertebral artery pathology, CT angiography could be considered, but my degree of suspicion is quite low, particularly based on the noncontrast imaging. IMPRESSION: 1. Normal MRI of the brain. 2. Normal MR angiography of the neck vessels. Evaluation of the right vertebral artery is somewhat suboptimal due to prominent venous contrast. Based on the normal appearance of the noncontrast exam, I do not have any significant suspicion regarding right vertebral pathology. If concern persists specifically regarding right vertebral pathology, CT angiography could be considered. 3. Normal intracranial MR angiography. Electronically Signed   By: Nelson Chimes M.D.   On: 05/05/2020 20:52    Procedures Procedures (including critical care time)  Medications Ordered in ED Medications  aspirin chewable tablet 324 mg (324 mg Oral Given 05/05/20 1520)  LORazepam (ATIVAN) injection 1 mg (1 mg Intravenous Given 05/05/20 1958)  gadobutrol (GADAVIST) 1 MMOL/ML injection 10 mL (10 mLs Intravenous Contrast Given 05/05/20 2013)  metoCLOPramide (REGLAN) injection 10 mg (10 mg Intravenous Given 05/05/20 2126)  dexamethasone (DECADRON) injection 10 mg (10 mg Intravenous Given 05/05/20 2126)  ketorolac (TORADOL) 15 MG/ML injection 15 mg (15 mg Intravenous Given 05/05/20 2126)  diphenhydrAMINE (BENADRYL) injection 12.5 mg (12.5 mg Intravenous Given 05/05/20 2126)    ED Course  I have reviewed the triage vital signs and the nursing notes.  Pertinent labs & imaging results that were available during my care of the patient were reviewed by me and considered in my medical decision making (see chart  for details).  39 yo female here as Ed to Ed transfer to complete stroke evaluation with MRI/MRA  Differential also includes complex migraines  Prior labs and CT images from [place] reviewed - no significant findings.  Trop <2.  Per earlier discussion between EDP and neurology, if MR imaging is negative patient can be discharged w/ outpt neuro f/u.  *  MRI unremarkable.  Patient requesting headache medications.  Per my review, none were given at [facility] earlier.  As I now suspect this is a complex migraine, we'll order IV migraine medications.  She can then be discharged home with neuro referral.  She has a ride coming to pick her up.  Clinical Course as of 05/05/20 2256  Sun May 05, 2020  2059  IMPRESSION: 1. Normal MRI of the brain. 2. Normal MR angiography of the neck vessels. Evaluation of the right vertebral artery is somewhat suboptimal due to prominent venous contrast. Based on the normal appearance of the noncontrast exam, I do not have any significant suspicion regarding right vertebral pathology. If concern persists specifically regarding right vertebral pathology, CT angiography could be considered. 3. Normal intracranial MR angiography. [MT]    Clinical Course User Index [MT] Annelle Behrendt, Carola Rhine, MD    Final Clinical Impression(s) / ED Diagnoses Final diagnoses:  Left sided numbness  Primary hypertension  Other migraine without status migrainosus, not intractable    Rx / DC Orders ED Discharge Orders         Ordered    Ambulatory referral to Neurology       Comments: An appointment is requested in approximately: 1 week Left sided paresthesias, negative stroke workup in [facility] ED, suspect complex migraines but requesting urgent neurology evaluation   05/05/20 2117           Wyvonnia Dusky, MD 05/05/20 2256

## 2020-05-06 ENCOUNTER — Encounter: Payer: Self-pay | Admitting: Neurology

## 2020-05-06 NOTE — ED Notes (Signed)
This RN wasted 1 mg of IV Ativan with Marquita Palms., RN at [employer] [facility]. Refer to Marquita Palms, RN for other inquiries.

## 2020-05-15 ENCOUNTER — Telehealth: Payer: Self-pay

## 2020-05-15 ENCOUNTER — Other Ambulatory Visit: Payer: Self-pay | Admitting: Internal Medicine

## 2020-05-15 MED ORDER — HYDROCHLOROTHIAZIDE 25 MG PO TABS: 25.0000 mg | ORAL_TABLET | Freq: Every day | ORAL | 1 refills | Status: DC

## 2020-05-15 NOTE — Telephone Encounter (Signed)
Scheduled Wed Feb 2 @ 4:10 with Ricky Stabs.

## 2020-05-15 NOTE — Telephone Encounter (Signed)
Patient has never been seen by you. Last OV was with Jerrilyn Cairo on 08/04/18. Patient was recently seen in ED BP at that time was 138/79. Can she receive a courtesy refill or does she need to schedule appointment to establish care first.

## 2020-05-15 NOTE — Telephone Encounter (Signed)
I did a courtesy fill of 30 days with 1 refill. Can you please make sure she has a visit with me or Amy within the next 2 months?   Thanks!   Marcy Siren, D.O. Primary Care at [facility]  05/15/2020, 2:59 PM

## 2020-05-15 NOTE — Telephone Encounter (Signed)
Patient called in requesting refills on her blood pressure medicine (HCTZ). I advised patient it may not be able to be refilled because she has not been seen in our clinic in a year.   She asked me to route a message to a provider stating she was just in the hospital and has been without her blood pressure medications for a week. Please advise.

## 2020-05-30 ENCOUNTER — Emergency Department (HOSPITAL_COMMUNITY): Payer: BC Managed Care – PPO

## 2020-05-30 ENCOUNTER — Emergency Department (HOSPITAL_COMMUNITY)
Admission: EM | Admit: 2020-05-30 | Discharge: 2020-05-31 | Disposition: A | Discharge status: Home / Self Care | Payer: BC Managed Care – PPO | Attending: Emergency Medicine | Admitting: Emergency Medicine

## 2020-05-30 ENCOUNTER — Other Ambulatory Visit: Payer: Self-pay

## 2020-05-30 ENCOUNTER — Encounter (HOSPITAL_COMMUNITY): Payer: Self-pay

## 2020-05-30 DIAGNOSIS — R600 Localized edema: Secondary | ICD-10-CM | POA: Insufficient documentation

## 2020-05-30 DIAGNOSIS — M79602 Pain in left arm: Secondary | ICD-10-CM | POA: Diagnosis not present

## 2020-05-30 DIAGNOSIS — M79632 Pain in left forearm: Secondary | ICD-10-CM | POA: Diagnosis not present

## 2020-05-30 DIAGNOSIS — M79642 Pain in left hand: Secondary | ICD-10-CM | POA: Insufficient documentation

## 2020-05-30 DIAGNOSIS — M545 Low back pain, unspecified: Secondary | ICD-10-CM | POA: Diagnosis not present

## 2020-05-30 DIAGNOSIS — S0990XA Unspecified injury of head, initial encounter: Secondary | ICD-10-CM

## 2020-05-30 DIAGNOSIS — Z79899 Other long term (current) drug therapy: Secondary | ICD-10-CM | POA: Insufficient documentation

## 2020-05-30 DIAGNOSIS — Y92410 Unspecified street and highway as the place of occurrence of the external cause: Secondary | ICD-10-CM | POA: Insufficient documentation

## 2020-05-30 DIAGNOSIS — I10 Essential (primary) hypertension: Secondary | ICD-10-CM | POA: Insufficient documentation
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
## 2020-05-31 MED ORDER — OXYCODONE-ACETAMINOPHEN 5-325 MG PO TABS
1.0000 | ORAL_TABLET | Freq: Once | ORAL | Status: AC
  Administered 2020-05-31: 1 via ORAL
  Filled 2020-05-31: qty 1

## 2020-05-31 MED ORDER — CYCLOBENZAPRINE HCL 10 MG PO TABS: 10.0000 mg | ORAL_TABLET | Freq: Two times a day (BID) | ORAL | 0 refills | Status: DC | PRN
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
## 2020-06-05 ENCOUNTER — Other Ambulatory Visit: Payer: Self-pay | Admitting: Physician Assistant

## 2020-06-05 ENCOUNTER — Ambulatory Visit (INDEPENDENT_AMBULATORY_CARE_PROVIDER_SITE_OTHER): Payer: BC Managed Care – PPO

## 2020-06-05 ENCOUNTER — Ambulatory Visit: Admission: RE | Admit: 2020-06-05 | Payer: BC Managed Care – PPO | Source: Ambulatory Visit

## 2020-06-05 ENCOUNTER — Ambulatory Visit (INDEPENDENT_AMBULATORY_CARE_PROVIDER_SITE_OTHER): Payer: BC Managed Care – PPO | Admitting: Physician Assistant

## 2020-06-05 ENCOUNTER — Other Ambulatory Visit: Payer: Self-pay

## 2020-06-05 ENCOUNTER — Encounter: Payer: Self-pay | Admitting: Physician Assistant

## 2020-06-05 DIAGNOSIS — F439 Reaction to severe stress, unspecified: Secondary | ICD-10-CM

## 2020-06-05 DIAGNOSIS — R937 Abnormal findings on diagnostic imaging of other parts of musculoskeletal system: Secondary | ICD-10-CM

## 2020-06-05 DIAGNOSIS — M5442 Lumbago with sciatica, left side: Secondary | ICD-10-CM | POA: Diagnosis not present

## 2020-06-05 DIAGNOSIS — M5441 Lumbago with sciatica, right side: Secondary | ICD-10-CM

## 2020-06-05 DIAGNOSIS — M542 Cervicalgia: Secondary | ICD-10-CM | POA: Diagnosis not present

## 2020-06-05 DIAGNOSIS — R1013 Epigastric pain: Secondary | ICD-10-CM

## 2020-06-05 DIAGNOSIS — G4700 Insomnia, unspecified: Secondary | ICD-10-CM

## 2020-06-05 DIAGNOSIS — I10 Essential (primary) hypertension: Secondary | ICD-10-CM

## 2020-06-05 LAB — POCT GLYCOSYLATED HEMOGLOBIN (HGB A1C): Hemoglobin A1C: 7.5 % — AB (ref 4.0–5.6)
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
## 2020-06-05 MED ORDER — HYDROXYZINE HCL 50 MG PO TABS
50.0000 mg | ORAL_TABLET | Freq: Three times a day (TID) | ORAL | 0 refills | Status: DC | PRN
Start: 2020-06-05 — End: 2020-06-05

## 2020-06-05 MED ORDER — HYDROXYZINE HCL 50 MG PO TABS: ORAL_TABLET | ORAL | 0 refills | Status: DC

## 2020-06-05 MED ORDER — OMEPRAZOLE 20 MG PO CPDR: 20.0000 mg | DELAYED_RELEASE_CAPSULE | Freq: Every day | ORAL | 3 refills | Status: DC

## 2020-06-05 MED ORDER — KETOROLAC TROMETHAMINE 60 MG/2ML IM SOLN
60.0000 mg | Freq: Once | INTRAMUSCULAR | Status: AC
  Administered 2020-06-05: 60 mg via INTRAMUSCULAR

## 2020-06-05 MED ORDER — METHYLPREDNISOLONE SODIUM SUCC 125 MG IJ SOLR
125.0000 mg | Freq: Once | INTRAMUSCULAR | Status: AC
  Administered 2020-06-05: 125 mg via INTRAMUSCULAR

## 2020-06-05 MED ORDER — CYCLOBENZAPRINE HCL 10 MG PO TABS: 10.0000 mg | ORAL_TABLET | Freq: Two times a day (BID) | ORAL | 0 refills | Status: DC | PRN

## 2020-06-05 MED ORDER — CYCLOBENZAPRINE HCL 10 MG PO TABS
10.0000 mg | ORAL_TABLET | Freq: Two times a day (BID) | ORAL | 0 refills | Status: DC | PRN
Start: 2020-06-05 — End: 2020-06-05

## 2020-06-05 MED ORDER — IBUPROFEN 800 MG PO TABS
800.0000 mg | ORAL_TABLET | Freq: Three times a day (TID) | ORAL | 0 refills | Status: DC
Start: 2020-06-05 — End: 2020-06-21

## 2020-06-05 MED ORDER — HYDROCHLOROTHIAZIDE 25 MG PO TABS: 25.0000 mg | ORAL_TABLET | Freq: Every day | ORAL | 1 refills | Status: DC
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
## 2020-06-05 NOTE — Patient Instructions (Addendum)
We are performing x-rays of your neck and back today.  We will discuss the results of these on Monday to see what neck steps we need to take.  I sent a refill of the muscle relaxer to your pharmacy, I encourage you to use that every 8 hours as needed.  I also sent ibuprofen 800, you can use this every 8 hours as needed as well for the pain and inflammation.  For your stomach discomfort I sent Prilosec to your pharmacy, you should use this for at least the next 2 weeks for relief.  I refilled your blood pressure medication, I encourage you to take this on a daily basis, check your blood pressure on a daily basis as well, keep a written log and have that available at your next office visits.  To help you with your stress related insomnia, I sent a prescription of hydroxyzine to your pharmacy, you can take 25 to 50 mg at bedtime as needed.  I hope that you feel better soon  Kennieth Rad, PA-C Physician Assistant [facility] [email or web address]    Textbook of family medicine (9th ed., pp. [phone]). [place], PA: [employer].">  Stress, Adult Stress is a normal reaction to life events. Stress is what you feel when life demands more than you are used to, or more than you think you can handle. Some stress can be useful, such as studying for a test or meeting a deadline at work. Stress that occurs too often or for too long can cause problems. It can affect your emotional health and interfere with relationships and normal daily activities. Too much stress can weaken your body's defense system (immune system) and increase your risk for physical illness. If you already have a medical problem, stress can make it worse. What are the causes? All sorts of life events can cause stress. An event that causes stress for one person may not be stressful for another person. Major life events, whether positive or negative, commonly cause stress. Examples  include:  Losing a job or starting a new job.  Losing a loved one.  Moving to a new town or home.  Getting married or divorced.  Having a baby.  Getting injured or sick. Less obvious life events can also cause stress, especially if they occur day after day or in combination with each other. Examples include:  Working long hours.  Driving in traffic.  Caring for children.  Being in debt.  Being in a difficult relationship. What are the signs or symptoms? Stress can cause emotional symptoms, including:  Anxiety. This is feeling worried, afraid, on edge, overwhelmed, or out of control.  Anger, including irritation or impatience.  Depression. This is feeling sad, down, helpless, or guilty.  Trouble focusing, remembering, or making decisions. Stress can cause physical symptoms, including:  Aches and pains. These may affect your head, neck, back, stomach, or other areas of your body.  Tight muscles or a clenched jaw.  Low energy.  Trouble sleeping. Stress can cause unhealthy behaviors, including:  Eating to feel better (overeating) or skipping meals.  Working too much or putting off tasks.  Smoking, drinking alcohol, or using drugs to feel better. How is this diagnosed? Stress is diagnosed through an assessment by your health care provider. He or she may diagnose this condition based on:  Your symptoms and any stressful life events.  Your medical history.  Tests to rule out other causes of your symptoms. Depending on your condition, your health care  provider may refer you to a specialist for further evaluation. How is this treated? Stress management techniques are the recommended treatment for stress. Medicine is not typically recommended for the treatment of stress. Techniques to reduce your reaction to stressful life events include:  Stress identification. Monitor yourself for symptoms of stress and identify what causes stress for you. These skills may help  you to avoid or prepare for stressful events.  Time management. Set your priorities, keep a calendar of events, and learn to say no. Taking these actions can help you avoid making too many commitments. Techniques for coping with stress include:  Rethinking the problem. Try to think realistically about stressful events rather than ignoring them or overreacting. Try to find the positives in a stressful situation rather than focusing on the negatives.  Exercise. Physical exercise can release both physical and emotional tension. The key is to find a form of exercise that you enjoy and do it regularly.  Relaxation techniques. These relax the body and mind. The key is to find one or more that you enjoy and use the techniques regularly. Examples include: ? Meditation, deep breathing, or progressive relaxation techniques. ? Yoga or tai chi. ? Biofeedback, mindfulness techniques, or journaling. ? Listening to music, being out in nature, or participating in other hobbies.  Practicing a healthy lifestyle. Eat a balanced diet, drink plenty of water, limit or avoid caffeine, and get plenty of sleep.  Having a strong support network. Spend time with family, friends, or other people you enjoy being around. Express your feelings and talk things over with someone you trust. Counseling or talk therapy with a mental health professional may be helpful if you are having trouble managing stress on your own.   Follow these instructions at home: Lifestyle  Avoid drugs.  Do not use any products that contain nicotine or tobacco, such as cigarettes, e-cigarettes, and chewing tobacco. If you need help quitting, ask your health care provider.  Limit alcohol intake to no more than 1 drink a day for nonpregnant women and 2 drinks a day for men. One drink equals 12 oz of beer, 5 oz of wine, or 1 oz of hard liquor  Do not use alcohol or drugs to relax.  Eat a balanced diet that includes fresh fruits and vegetables,  whole grains, lean meats, fish, eggs, and beans, and low-fat dairy. Avoid processed foods and foods high in added fat, sugar, and salt.  Exercise at least 30 minutes on 5 or more days each week.  Get 7-8 hours of sleep each night.   General instructions  Practice stress management techniques as discussed with your health care provider.  Drink enough fluid to keep your urine clear or pale yellow.  Take over-the-counter and prescription medicines only as told by your health care provider.  Keep all follow-up visits as told by your health care provider. This is important.   Contact a health care provider if:  Your symptoms get worse.  You have new symptoms.  You feel overwhelmed by your problems and can no longer manage them on your own. Get help right away if:  You have thoughts of hurting yourself or others. If you ever feel like you may hurt yourself or others, or have thoughts about taking your own life, get help right away. You can go to your nearest emergency department or call:  Your local emergency services (911 in the [place]  A suicide crisis helpline, such as the [facility] at 3393464918.  This is open 24 hours a day. Summary  Stress is a normal reaction to life events. It can cause problems if it happens too often or for too long.  Practicing stress management techniques is the best way to treat stress.  Counseling or talk therapy with a mental health professional may be helpful if you are having trouble managing stress on your own. This information is not intended to replace advice given to you by your health care provider. Make sure you discuss any questions you have with your health care provider. Document Revised: 01/19/2020 Document Reviewed: 01/19/2020 Elsevier Patient Education  2021 [employer].
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
## 2020-06-06 DIAGNOSIS — M5442 Lumbago with sciatica, left side: Secondary | ICD-10-CM | POA: Insufficient documentation

## 2020-06-06 DIAGNOSIS — M542 Cervicalgia: Secondary | ICD-10-CM | POA: Insufficient documentation

## 2020-06-06 DIAGNOSIS — F439 Reaction to severe stress, unspecified: Secondary | ICD-10-CM | POA: Insufficient documentation

## 2020-06-06 DIAGNOSIS — R1013 Epigastric pain: Secondary | ICD-10-CM | POA: Insufficient documentation

## 2020-06-06 DIAGNOSIS — G4700 Insomnia, unspecified: Secondary | ICD-10-CM | POA: Insufficient documentation

## 2020-06-06 DIAGNOSIS — M5441 Lumbago with sciatica, right side: Secondary | ICD-10-CM | POA: Insufficient documentation

## 2020-06-06 DIAGNOSIS — I10 Essential (primary) hypertension: Secondary | ICD-10-CM | POA: Insufficient documentation

## 2020-06-07 ENCOUNTER — Inpatient Hospital Stay: Payer: BC Managed Care – PPO | Admitting: Family

## 2020-06-08 ENCOUNTER — Other Ambulatory Visit: Payer: Self-pay | Admitting: Internal Medicine

## 2020-06-08 DIAGNOSIS — I10 Essential (primary) hypertension: Secondary | ICD-10-CM
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
## 2020-06-10 ENCOUNTER — Telehealth: Payer: BC Managed Care – PPO | Admitting: Physician Assistant

## 2020-06-10 ENCOUNTER — Telehealth (INDEPENDENT_AMBULATORY_CARE_PROVIDER_SITE_OTHER): Payer: BC Managed Care – PPO | Admitting: Family

## 2020-06-10 ENCOUNTER — Other Ambulatory Visit: Payer: Self-pay

## 2020-06-10 DIAGNOSIS — M5441 Lumbago with sciatica, right side: Secondary | ICD-10-CM | POA: Diagnosis not present

## 2020-06-10 DIAGNOSIS — M5442 Lumbago with sciatica, left side: Secondary | ICD-10-CM

## 2020-06-10 DIAGNOSIS — E119 Type 2 diabetes mellitus without complications: Secondary | ICD-10-CM | POA: Diagnosis not present

## 2020-06-10 DIAGNOSIS — M542 Cervicalgia: Secondary | ICD-10-CM | POA: Diagnosis not present

## 2020-06-10 MED ORDER — METFORMIN HCL 500 MG PO TABS: 500.0000 mg | ORAL_TABLET | Freq: Two times a day (BID) | ORAL | 0 refills | Status: DC

## 2020-06-10 MED ORDER — TRUE METRIX METER W/DEVICE KIT: PACK | 0 refills | Status: DC

## 2020-06-10 MED ORDER — TRUE METRIX BLOOD GLUCOSE TEST VI STRP: ORAL_STRIP | 12 refills | Status: DC

## 2020-06-10 MED ORDER — TRUEPLUS LANCETS 28G MISC: 4 refills | Status: AC

## 2020-06-10 NOTE — Progress Notes (Signed)
Establish care  Review labs taken few days ago

## 2020-06-11 NOTE — Progress Notes (Signed)
Patient was made aware of results and plan from PCP visit.

## 2020-06-12 ENCOUNTER — Telehealth: Payer: Self-pay | Admitting: Family

## 2020-06-12 NOTE — Telephone Encounter (Signed)
Pt called for glucose meter kit instructions for her DM and is also inquiring about any other options to Manage it without needing to poke the finger every morning and evening, "It's too much." Please advise and thank you. Number to be reached at is the [phone].

## 2020-06-13 ENCOUNTER — Encounter: Payer: Self-pay | Admitting: Family

## 2020-06-13 NOTE — Telephone Encounter (Signed)
Provider has been notified.  

## 2020-06-14 ENCOUNTER — Other Ambulatory Visit: Payer: Self-pay

## 2020-06-14 ENCOUNTER — Ambulatory Visit (HOSPITAL_COMMUNITY)
Admission: RE | Admit: 2020-06-14 | Discharge: 2020-06-14 | Disposition: A | Discharge status: Home / Self Care | Payer: BC Managed Care – PPO | Source: Ambulatory Visit | Attending: Physician Assistant | Admitting: Physician Assistant

## 2020-06-14 DIAGNOSIS — R937 Abnormal findings on diagnostic imaging of other parts of musculoskeletal system: Secondary | ICD-10-CM | POA: Insufficient documentation
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
## 2020-06-14 MED ORDER — DEXCOM G6 TRANSMITTER MISC: 1.0000 | Freq: Three times a day (TID) | 0 refills | Status: DC

## 2020-06-14 MED ORDER — DEXCOM G6 RECEIVER DEVI: 1.0000 | Freq: Three times a day (TID) | 0 refills | Status: AC

## 2020-06-14 MED ORDER — DEXCOM G6 SENSOR MISC: 1.0000 | Freq: Three times a day (TID) | 0 refills | Status: DC

## 2020-06-14 NOTE — Addendum Note (Signed)
Addended by: [name] on: 06/14/2020 03:05 PM   Modules accepted: Orders

## 2020-06-14 NOTE — Telephone Encounter (Signed)
Please call patient with update.   Dexcom 6 diabetic testing kit and supplies ordered and sent to pharmacy on file.

## 2020-06-15 ENCOUNTER — Encounter: Payer: Self-pay | Admitting: Physician Assistant

## 2020-06-16 ENCOUNTER — Telehealth: Payer: Self-pay | Admitting: *Deleted

## 2020-06-16 NOTE — Telephone Encounter (Signed)
Medical Assistant left message on patient's home and cell voicemail. Voicemail states to give a call back to [place] with [facility] at [phone]. Patient is aware of no fracture being noted in the spine. Patient is aware of a mild to moderate disc degeneration at C6 and C7 with a disc bulge. Patient encouraged to keep FU appointments with orthopedics and her PCP.

## 2020-06-16 NOTE — Telephone Encounter (Signed)
-----   Message from Kennieth Rad, [place] sent at 06/15/2020  4:55 PM EST ----- Please call patient and let her know that the CT of her spine did not show any fracture. She does have mild to moderate disc degeneration at C6 and C7 with a disc bulge, she does have an upcoming appointment with orthopedics and a follow-up with her PCP. Encouraged to keep those appointments

## 2020-06-18 ENCOUNTER — Telehealth: Payer: Self-pay

## 2020-06-18 NOTE — Telephone Encounter (Signed)
Patient requested refills and clarification on when to return to work, please advice.

## 2020-06-19 ENCOUNTER — Ambulatory Visit: Payer: BC Managed Care – PPO | Admitting: Family

## 2020-06-20 ENCOUNTER — Other Ambulatory Visit: Payer: Self-pay | Admitting: Physician Assistant

## 2020-06-20 DIAGNOSIS — M5442 Lumbago with sciatica, left side: Secondary | ICD-10-CM

## 2020-06-20 DIAGNOSIS — M542 Cervicalgia: Secondary | ICD-10-CM

## 2020-06-20 DIAGNOSIS — M5441 Lumbago with sciatica, right side: Secondary | ICD-10-CM

## 2020-06-21 MED ORDER — FREESTYLE LIBRE READER DEVI: 1.0000 | Freq: Once | 0 refills | Status: AC

## 2020-06-21 MED ORDER — FREESTYLE LIBRE SENSOR SYSTEM MISC
12 refills | Status: DC
Start: 2020-06-21 — End: 2020-11-14

## 2020-06-21 NOTE — Addendum Note (Signed)
Addended by: [name] on: 06/21/2020 05:07 PM   Modules accepted: Orders

## 2020-06-21 NOTE — Telephone Encounter (Signed)
Dexacom G6 not covered by insurance. Provider will send in [place] if not approved by insurance PA will be completed for Dexacom G6

## 2020-07-01 ENCOUNTER — Telehealth: Payer: Self-pay

## 2020-07-01 NOTE — Telephone Encounter (Signed)
Att to contact pt to verify if insurance approved the Dexcom G6 for her glucose monitoring no ans unable to lvm mailbox full

## 2020-07-03 ENCOUNTER — Ambulatory Visit (INDEPENDENT_AMBULATORY_CARE_PROVIDER_SITE_OTHER): Payer: BC Managed Care – PPO | Admitting: Orthopaedic Surgery

## 2020-07-03 ENCOUNTER — Telehealth: Payer: Self-pay | Admitting: Orthopaedic Surgery

## 2020-07-03 ENCOUNTER — Encounter: Payer: Self-pay | Admitting: Orthopaedic Surgery

## 2020-07-03 DIAGNOSIS — M542 Cervicalgia: Secondary | ICD-10-CM

## 2020-07-03 NOTE — Telephone Encounter (Signed)
Pt called stating she was recently diagnosed with type 2 diabetes and wanted to know if the pain and swelling she has been experiencing might start to affect her work? 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 Pt would like a CB from Dr. Lorin Mercy to further discuss  [phone]
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
## 2020-07-03 NOTE — Addendum Note (Signed)
Addended by: Robyne Peers on: 07/03/2020 10:02 AM   Modules accepted: Orders

## 2020-07-04 ENCOUNTER — Other Ambulatory Visit: Payer: Self-pay

## 2020-07-04 ENCOUNTER — Encounter: Payer: Self-pay | Admitting: Rehabilitative and Restorative Service Providers"

## 2020-07-04 ENCOUNTER — Ambulatory Visit: Payer: BC Managed Care – PPO | Admitting: Physical Therapy

## 2020-07-04 ENCOUNTER — Ambulatory Visit (INDEPENDENT_AMBULATORY_CARE_PROVIDER_SITE_OTHER): Payer: BC Managed Care – PPO | Admitting: Rehabilitative and Restorative Service Providers"

## 2020-07-04 ENCOUNTER — Encounter: Payer: Self-pay | Admitting: Orthopaedic Surgery

## 2020-07-04 DIAGNOSIS — R293 Abnormal posture: Secondary | ICD-10-CM | POA: Diagnosis not present

## 2020-07-04 DIAGNOSIS — M542 Cervicalgia: Secondary | ICD-10-CM

## 2020-07-04 DIAGNOSIS — M545 Low back pain, unspecified: Secondary | ICD-10-CM | POA: Diagnosis not present

## 2020-07-04 NOTE — Telephone Encounter (Signed)
Please advise 

## 2020-07-04 NOTE — Patient Instructions (Signed)
Access Code: [email or web address] URL: [email or web address] Date: 07/04/2020 Prepared by: Scot Jun  Exercises Supine Lower Trunk Rotation - 2 x daily - 7 x weekly - 1 sets - 5 reps - 15 hold Supine Bridge - 2 x daily - 7 x weekly - 3 sets - 10 reps - 2 hold Seated Scapular Retraction - 2 x daily - 7 x weekly - 1 sets - 15 reps - 5 hold Supine Chin Tuck - 2 x daily - 7 x weekly - 1 sets - 10 reps - 5 hold Supine Cervical Rotation AROM on Pillow - 2 x daily - 7 x weekly - 3 sets - 10 reps Standing Lumbar Extension - 2 x daily - 7 x weekly - 1-2 sets - 10 reps
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
## 2020-07-04 NOTE — Telephone Encounter (Signed)
FYI, I called , left message  to loose weight and be more active, see dietitian for ADA diet etc.

## 2020-07-05 ENCOUNTER — Telehealth: Payer: Self-pay | Admitting: Family

## 2020-07-05 NOTE — Telephone Encounter (Signed)
Att to contact pt to advise that FMLA paperwork is partially completed in order for provider to complete, need to know pt job functions Part C/ Q.10  no ans lvm

## 2020-07-05 NOTE — Telephone Encounter (Signed)
PT  Gave  fax number for FMLA paperwork to be faxed to [phone] Attention to [place]. Please advise and thank you

## 2020-07-05 NOTE — Telephone Encounter (Signed)
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

## 2020-07-08 ENCOUNTER — Other Ambulatory Visit: Payer: Self-pay | Admitting: Physician Assistant

## 2020-07-08 DIAGNOSIS — G4700 Insomnia, unspecified: Secondary | ICD-10-CM

## 2020-07-10 ENCOUNTER — Telehealth: Payer: Self-pay | Admitting: Rehabilitative and Restorative Service Providers"

## 2020-07-10 ENCOUNTER — Encounter: Payer: BC Managed Care – PPO | Admitting: Rehabilitative and Restorative Service Providers"

## 2020-07-10 NOTE — Telephone Encounter (Signed)
Pt. Indicated she thought appointment was in afternoon not this morning.  Reminded of next appointment.  Scot Jun, PT, DPT, OCS, ATC 07/10/20  10:35 AM

## 2020-07-10 NOTE — Telephone Encounter (Signed)
Courtesy refill of Hydroxyzine per patient request. Will discuss additional refills during patient's annual physical exam scheduled for next week.

## 2020-07-10 NOTE — Telephone Encounter (Signed)
Hydroxyzine refill

## 2020-07-15 ENCOUNTER — Ambulatory Visit (INDEPENDENT_AMBULATORY_CARE_PROVIDER_SITE_OTHER): Payer: BC Managed Care – PPO | Admitting: Family

## 2020-07-15 ENCOUNTER — Other Ambulatory Visit: Payer: Self-pay

## 2020-07-15 ENCOUNTER — Other Ambulatory Visit: Payer: Self-pay | Admitting: Family

## 2020-07-15 VITALS — BP 141/87 | HR 67 | Ht 66.3 in | Wt 273.8 lb

## 2020-07-15 DIAGNOSIS — Z130 Encounter for screening for diseases of the blood and blood-forming organs and certain disorders involving the immune mechanism: Secondary | ICD-10-CM

## 2020-07-15 DIAGNOSIS — I10 Essential (primary) hypertension: Secondary | ICD-10-CM

## 2020-07-15 DIAGNOSIS — Z13220 Encounter for screening for lipoid disorders: Secondary | ICD-10-CM | POA: Diagnosis not present

## 2020-07-15 DIAGNOSIS — E119 Type 2 diabetes mellitus without complications: Secondary | ICD-10-CM

## 2020-07-15 DIAGNOSIS — R234 Changes in skin texture: Secondary | ICD-10-CM

## 2020-07-15 DIAGNOSIS — Z0000 Encounter for general adult medical examination without abnormal findings: Secondary | ICD-10-CM

## 2020-07-15 DIAGNOSIS — Z13228 Encounter for screening for other metabolic disorders: Secondary | ICD-10-CM

## 2020-07-15 DIAGNOSIS — Z124 Encounter for screening for malignant neoplasm of cervix: Secondary | ICD-10-CM

## 2020-07-15 DIAGNOSIS — Z1329 Encounter for screening for other suspected endocrine disorder: Secondary | ICD-10-CM

## 2020-07-15 MED ORDER — HYDROCHLOROTHIAZIDE 50 MG PO TABS
50.0000 mg | ORAL_TABLET | Freq: Every day | ORAL | 0 refills | Status: DC
Start: 2020-07-15 — End: 2020-08-14

## 2020-07-15 MED ORDER — AMLODIPINE BESYLATE 5 MG PO TABS: 5.0000 mg | ORAL_TABLET | Freq: Every day | ORAL | 0 refills | Status: DC

## 2020-07-15 MED ORDER — DAPAGLIFLOZIN PROPANEDIOL 5 MG PO TABS: 5.0000 mg | ORAL_TABLET | Freq: Every day | ORAL | 0 refills | Status: DC

## 2020-07-15 NOTE — Patient Instructions (Addendum)
Annual physical exam and labs today. Will call with results.   Increase Hydrochlorothiazide for high blood pressure.   Adding Amlodipine for high blood pressure.  Diabetes checkup in 1 month.   High blood pressure checkup in 1 month.   Referral to Dietician.   Referral to Dermatology.   Preventive Care 40-41 Years Old, Female Preventive care refers to lifestyle choices and visits with your health care provider that can promote health and wellness. This includes:  A yearly physical exam. This is also called an annual wellness visit.  Regular dental and eye exams.  Immunizations.  Screening for certain conditions.  Healthy lifestyle choices, such as: ? Eating a healthy diet. ? Getting regular exercise. ? Not using drugs or products that contain nicotine and tobacco. ? Limiting alcohol use. What can I expect for my preventive care visit? Physical exam Your health care provider will check your:  Height and weight. These may be used to calculate your BMI (body mass index). BMI is a measurement that tells if you are at a healthy weight.  Heart rate and blood pressure.  Body temperature.  Skin for abnormal spots. Counseling Your health care provider may ask you questions about your:  Past medical problems.  Family's medical history.  Alcohol, tobacco, and drug use.  Emotional well-being.  Home life and relationship well-being.  Sexual activity.  Diet, exercise, and sleep habits.  Work and work Statistician.  Access to firearms.  Method of birth control.  Menstrual cycle.  Pregnancy history. What immunizations do I need? Vaccines are usually given at various ages, according to a schedule. Your health care provider will recommend vaccines for you based on your age, medical history, and lifestyle or other factors, such as travel or where you work.   What tests do I need? Blood tests  Lipid and cholesterol levels. These may be checked every 5 years, or  more often if you are over 77 years old.  Hepatitis C test.  Hepatitis B test. Screening  Lung cancer screening. You may have this screening every year starting at age 47 if you have a 30-pack-year history of smoking and currently smoke or have quit within the past 15 years.  Colorectal cancer screening. ? All adults should have this screening starting at age 82 and continuing until age 33. ? Your health care provider may recommend screening at age 31 if you are at increased risk. ? You will have tests every 1-10 years, depending on your results and the type of screening test.  Diabetes screening. ? This is done by checking your blood sugar (glucose) after you have not eaten for a while (fasting). ? You may have this done every 1-3 years.  Mammogram. ? This may be done every 1-2 years. ? Talk with your health care provider about when you should start having regular mammograms. This may depend on whether you have a family history of breast cancer.  BRCA-related cancer screening. This may be done if you have a family history of breast, ovarian, tubal, or peritoneal cancers.  Pelvic exam and Pap test. ? This may be done every 3 years starting at age 78. ? Starting at age 61, this may be done every 5 years if you have a Pap test in combination with an HPV test. Other tests  STD (sexually transmitted disease) testing, if you are at risk.  Bone density scan. This is done to screen for osteoporosis. You may have this scan if you are at high risk  for osteoporosis. Talk with your health care provider about your test results, treatment options, and if necessary, the need for more tests. Follow these instructions at home: Eating and drinking  Eat a diet that includes fresh fruits and vegetables, whole grains, lean protein, and low-fat dairy products.  Take vitamin and mineral supplements as recommended by your health care provider.  Do not drink alcohol if: ? Your health care provider  tells you not to drink. ? You are pregnant, may be pregnant, or are planning to become pregnant.  If you drink alcohol: ? Limit how much you have to 0-1 drink a day. ? Be aware of how much alcohol is in your drink. In the [place] one drink equals one 12 oz bottle of beer (355 mL), one 5 oz glass of wine (148 mL), or one 1 oz glass of hard liquor (44 mL).   Lifestyle  Take daily care of your teeth and gums. Brush your teeth every morning and night with fluoride toothpaste. Floss one time each day.  Stay active. Exercise for at least 30 minutes 5 or more days each week.  Do not use any products that contain nicotine or tobacco, such as cigarettes, e-cigarettes, and chewing tobacco. If you need help quitting, ask your health care provider.  Do not use drugs.  If you are sexually active, practice safe sex. Use a condom or other form of protection to prevent STIs (sexually transmitted infections).  If you do not wish to become pregnant, use a form of birth control. If you plan to become pregnant, see your health care provider for a prepregnancy visit.  If told by your health care provider, take low-dose aspirin daily starting at age 72.  Find healthy ways to cope with stress, such as: ? Meditation, yoga, or listening to music. ? Journaling. ? Talking to a trusted person. ? Spending time with friends and family. Safety  Always wear your seat belt while driving or riding in a vehicle.  Do not drive: ? If you have been drinking alcohol. Do not ride with someone who has been drinking. ? When you are tired or distracted. ? While texting.  Wear a helmet and other protective equipment during sports activities.  If you have firearms in your house, make sure you follow all gun safety procedures. What's next?  Visit your health care provider once a year for an annual wellness visit.  Ask your health care provider how often you should have your eyes and teeth checked.  Stay up to date on  all vaccines. This information is not intended to replace advice given to you by your health care provider. Make sure you discuss any questions you have with your health care provider. Document Revised: 02/06/2020 Document Reviewed: 01/13/2018 Elsevier Patient Education  2021 [employer].

## 2020-07-15 NOTE — Progress Notes (Signed)
Annual physical Metformin causing extreme diarrhea, dry mouth and thirst  Needs refill on hydrochlorothiazide
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
## 2020-07-16 ENCOUNTER — Telehealth: Payer: Self-pay | Admitting: Family

## 2020-07-16 LAB — LIPID PANEL
Chol/HDL Ratio: 3.2 ratio (ref 0.0–4.4)
Cholesterol, Total: 137 mg/dL (ref 100–199)
HDL: 43 mg/dL (ref >39)
LDL Chol Calc (NIH): 82 mg/dL (ref 0–99)
Triglycerides: 59 mg/dL (ref 0–149)
VLDL Cholesterol Cal: 12 mg/dL (ref 5–40)

## 2020-07-16 LAB — MICROALBUMIN / CREATININE URINE RATIO
Creatinine, Urine: 126.3 mg/dL
Microalb/Creat Ratio: 29 mg/g creat (ref 0–29)
Microalbumin, Urine: 36.3 ug/mL

## 2020-07-16 LAB — TSH: TSH: 1.36 u[IU]/mL (ref 0.450–4.500)

## 2020-07-16 NOTE — Telephone Encounter (Signed)
Pt called to request to change the date on the letter requested to Today, 07/16/20. Please advise and thank you.

## 2020-07-16 NOTE — Telephone Encounter (Signed)
Pt calling requesting a letter sent through My Chart for her employer stating she can return to work starting tomorrow. Please advise and thank you

## 2020-07-17 ENCOUNTER — Encounter: Payer: BC Managed Care – PPO | Admitting: Rehabilitative and Restorative Service Providers"

## 2020-07-17 ENCOUNTER — Other Ambulatory Visit: Payer: Self-pay | Admitting: Family

## 2020-07-17 DIAGNOSIS — E119 Type 2 diabetes mellitus without complications: Secondary | ICD-10-CM

## 2020-07-18 NOTE — Telephone Encounter (Signed)
Please confirm with patient if she requested Metformin to be refilled.   At our last visit on 07/15/2020 patient shared that Metformin causes diarrhea and she requested to discontinue and begin a new medication because she was returning to work soon.   We started Farxiga at that time to replace Metformin.

## 2020-07-18 NOTE — Telephone Encounter (Signed)
A note was provided to the patient on 07/04/2020 by Dr. Lorin Mercy which states she is able to return to work on 07/16/2020. Also, I have completed FMLA paperwork which indicates the same.

## 2020-07-18 NOTE — Telephone Encounter (Signed)
Request Metformin refill

## 2020-07-18 NOTE — Progress Notes (Signed)
Please call patient with update.   Cholesterol normal.  Thyroid normal.   Kidney function normal.

## 2020-07-19 NOTE — Addendum Note (Signed)
Addended by: [name] on: 07/19/2020 11:46 AM   Modules accepted: Orders

## 2020-07-24 ENCOUNTER — Ambulatory Visit (INDEPENDENT_AMBULATORY_CARE_PROVIDER_SITE_OTHER): Payer: BC Managed Care – PPO | Admitting: Rehabilitative and Restorative Service Providers"

## 2020-07-24 ENCOUNTER — Encounter: Payer: Self-pay | Admitting: Rehabilitative and Restorative Service Providers"

## 2020-07-24 ENCOUNTER — Other Ambulatory Visit: Payer: Self-pay

## 2020-07-24 DIAGNOSIS — M545 Low back pain, unspecified: Secondary | ICD-10-CM | POA: Diagnosis not present

## 2020-07-24 DIAGNOSIS — M542 Cervicalgia: Secondary | ICD-10-CM | POA: Diagnosis not present

## 2020-07-24 DIAGNOSIS — R293 Abnormal posture: Secondary | ICD-10-CM
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
## 2020-07-30 NOTE — Progress Notes (Deleted)
[facility] Clinic Note - Initial Visit   Date: 07/30/20  Carmen [name] MRN: [id number] DOB: 1980-10-07   Dear Dr. Langston Masker:  Thank you for your kind referral of Carmen [name] for consultation of ***. Although her history is well known to you, please allow [place] to reiterate it for the purpose of our medical record. The patient was accompanied to the clinic by *** who also provides collateral information.     History of Present Illness: Carmen [name] is a 40 y.o. ***-handed female with diabetes mellitus, migraines, *** presenting for evaluation of left sided numbness/tingling.   She went to the ER on 05/05/2020 with left sided numbness/tingling where evaluation for stroke was negative and symptoms treated as complex migraine.   Out-side paper records, electronic medical record, and images have been reviewed where available and summarized as: ***  MRIA brain 05/05/2020:  IMPRESSION: 1. Normal MRI of the brain. 2. Normal MR angiography of the neck vessels. Evaluation of the right vertebral artery is somewhat suboptimal due to prominent venous contrast. Based on the normal appearance of the noncontrast exam, I do not have any significant suspicion regarding right vertebral pathology. If concern persists specifically regarding right vertebral pathology, CT angiography could be considered. 3. Normal intracranial MR angiography.    Lab Results  Component Value Date   HGBA1C 7.5 (A) 06/05/2020   No results found for: VITAMINB12 Lab Results  Component Value Date   TSH 1.360 07/15/2020   No results found for: ESRSEDRATE, POCTSEDRATE  Past Medical History:  Diagnosis Date  . Anemia   . Bilateral carpal tunnel syndrome   . Diabetes mellitus without complication ([place])    Phreesia 07/15/2020  . Essential hypertension   . Fibroids    Uterine unspecified  . Genital herpes   . Heart murmur   . History of migraine headaches    Seen By Dr. Venia Minks  . IBS (irritable bowel syndrome)   . Migraine 07/11/2014  . Morbid obesity ([place])   . OSA (obstructive sleep apnea)   . Sleep apnea    Phreesia 07/15/2020    Past Surgical History:  Procedure Laterality Date  . CARPAL TUNNEL RELEASE Right   . CESAREAN SECTION      x 2  . WISDOM TOOTH EXTRACTION       Medications:  Outpatient Encounter Medications as of 08/01/2020  Medication Sig  . albuterol (VENTOLIN HFA) 108 (90 Base) MCG/ACT inhaler Inhale into the lungs.  Marland Kitchen amLODipine (NORVASC) 5 MG tablet Take 1 tablet (5 mg total) by mouth daily.  . Blood Glucose Monitoring Suppl (TRUE METRIX METER) w/Device KIT Use as directed  . cetirizine (ZYRTEC) 10 MG tablet Take 1 tablet (10 mg total) by mouth daily.  . Continuous Blood Gluc Receiver (DEXCOM G6 RECEIVER) DEVI 1 Device by Does not apply route 4 (four) times daily -  before meals and at bedtime.  . Continuous Blood Gluc Sensor (DEXCOM G6 SENSOR) MISC 1 Device by Does not apply route 4 (four) times daily -  before meals and at bedtime.  . Continuous Blood Gluc Sensor (FREESTYLE LIBRE SENSOR SYSTEM) MISC Change sensor Q 2 wks  . Continuous Blood Gluc Transmit (DEXCOM G6 TRANSMITTER) MISC 1 Device by Does not apply route 4 (four) times daily -  before meals and at bedtime.  . cyclobenzaprine (FLEXERIL) 10 MG tablet Take 1 tablet (10 mg total) by mouth 2 (two) times daily as needed for muscle spasms.  . dapagliflozin  propanediol (FARXIGA) 5 MG TABS tablet Take 1 tablet (5 mg total) by mouth daily before breakfast.  . glucose blood (TRUE METRIX BLOOD GLUCOSE TEST) test strip Use as instructed  . hydrochlorothiazide (HYDRODIURIL) 50 MG tablet Take 1 tablet (50 mg total) by mouth daily.  . hydrOXYzine (ATARAX/VISTARIL) 50 MG tablet TAKE 1/2 -1 FULL TAB AT BEDTIME AS NEEDED FOR INSOMIA  . ibuprofen (ADVIL) 800 MG tablet TAKE 1 TABLET BY MOUTH THREE TIMES A DAY  . ketoconazole (NIZORAL) 2 % shampoo Apply to scalp, leave on 5 minutes then  rinse out.  Marland Kitchen levonorgestrel (MIRENA) 20 MCG/24HR IUD 1 each by Intrauterine route once. Implanted December 2014  . Multiple Vitamin (MULTIVITAMIN WITH MINERALS) TABS tablet Take 1 tablet by mouth daily.  Marland Kitchen omeprazole (PRILOSEC) 20 MG capsule Take 1 capsule (20 mg total) by mouth daily.  . TRUEplus Lancets 28G MISC Use as directed  . valACYclovir (VALTREX) 500 MG tablet TAKE 1 TABLET (500 MG) BY MOUTH TWICE DAILY FOR 5 DAYS AS NEEDED FOR OUTBREAKS  . [DISCONTINUED] dicyclomine (BENTYL) 20 MG tablet Take 1 tablet (20 mg total) by mouth 2 (two) times daily.  . [DISCONTINUED] diltiazem (CARDIZEM CD) 120 MG 24 hr capsule Take 1 capsule (120 mg total) by mouth daily.   No facility-administered encounter medications on file as of 08/01/2020.    Allergies: No Known Allergies  Family History: Family History  Problem Relation Age of Onset  . Breast cancer Mother   . Hypertension Mother   . Hyperlipidemia Mother   . Hypertension Brother   . Hypertension Father   . Hyperlipidemia Father   . Diabetes Other   . Hypercholesterolemia Other   . Gout Other   . Congestive Heart Failure Other   . Alzheimer's disease Other   . Heart attack Other   . Ovarian cancer Other        Aunt  . Stroke Maternal Grandmother   . Kidney disease Maternal Grandmother   . Heart disease Maternal Grandmother   . Diabetes Paternal Grandmother   . Breast cancer Maternal Aunt   . Heart disease Maternal Grandfather   . Hypertension Maternal Grandfather   . Breast cancer Maternal Aunt   . Migraines Neg Hx     Social History: Social History   Tobacco Use  . Smoking status: Never Smoker  . Smokeless tobacco: Never Used  Substance Use Topics  . Alcohol use: No    Alcohol/week: 0.0 standard drinks  . Drug use: No   Social History   Social History Narrative   Lives at home with 2 kids.   Patient is right handed.   Patient drinks 4 sodas/week    Vital Signs:  There were no vitals taken for this  visit.   General Medical Exam:  *** General:  Well appearing, comfortable.   Eyes/ENT: see cranial nerve examination.   Neck:   No carotid bruits. Respiratory:  Clear to auscultation, good air entry bilaterally.   Cardiac:  Regular rate and rhythm, no murmur.   Extremities:  No deformities, edema, or skin discoloration.  Skin:  No rashes or lesions.  Neurological Exam: MENTAL STATUS including orientation to time, place, person, recent and remote memory, attention span and concentration, language, and fund of knowledge is ***normal.  Speech is not dysarthric.  CRANIAL NERVES: II:  No visual field defects.  Unremarkable fundi.   III-IV-VI: Pupils equal round and reactive to light.  Normal conjugate, extra-ocular eye movements in all directions of gaze.  No nystagmus.  No ptosis***.   V:  Normal facial sensation.    VII:  Normal facial symmetry and movements.   VIII:  Normal hearing and vestibular function.   IX-X:  Normal palatal movement.   XI:  Normal shoulder shrug and head rotation.   XII:  Normal tongue strength and range of motion, no deviation or fasciculation.  MOTOR:  No atrophy, fasciculations or abnormal movements.  No pronator drift.   Upper Extremity:  Right  Left  Deltoid  5/5   5/5   Biceps  5/5   5/5   Triceps  5/5   5/5   Infraspinatus 5/5  5/5  Medial pectoralis 5/5  5/5  Wrist extensors  5/5   5/5   Wrist flexors  5/5   5/5   Finger extensors  5/5   5/5   Finger flexors  5/5   5/5   Dorsal interossei  5/5   5/5   Abductor pollicis  5/5   5/5   Tone (Ashworth scale)  0  0   Lower Extremity:  Right  Left  Hip flexors  5/5   5/5   Hip extensors  5/5   5/5   Adductor 5/5  5/5  Abductor 5/5  5/5  Knee flexors  5/5   5/5   Knee extensors  5/5   5/5   Dorsiflexors  5/5   5/5   Plantarflexors  5/5   5/5   Toe extensors  5/5   5/5   Toe flexors  5/5   5/5   Tone (Ashworth scale)  0  0   MSRs:  Right        Left                  brachioradialis 2+  2+   biceps 2+  2+  triceps 2+  2+  patellar 2+  2+  ankle jerk 2+  2+  Hoffman no  no  plantar response down  down   SENSORY:  Normal and symmetric perception of light touch, pinprick, vibration, and proprioception.  Romberg's sign absent.   COORDINATION/GAIT: Normal finger-to- nose-finger and heel-to-shin.  Intact rapid alternating movements bilaterally.  Able to rise from a chair without using arms.  Gait narrow based and stable. Tandem and stressed gait intact.    IMPRESSION: ***  PLAN/RECOMMENDATIONS:  *** Return to clinic in *** months.  Total time spent: ***   Thank you for allowing me to participate in patient's care.  If I can answer any additional questions, I would be pleased to do so.    Sincerely,    Dantae Meunier K. Posey Pronto, DO

## 2020-07-31 ENCOUNTER — Encounter: Payer: Self-pay | Admitting: Rehabilitative and Restorative Service Providers"

## 2020-07-31 ENCOUNTER — Other Ambulatory Visit: Payer: Self-pay

## 2020-07-31 ENCOUNTER — Ambulatory Visit (INDEPENDENT_AMBULATORY_CARE_PROVIDER_SITE_OTHER): Payer: BC Managed Care – PPO | Admitting: Rehabilitative and Restorative Service Providers"

## 2020-07-31 DIAGNOSIS — M545 Low back pain, unspecified: Secondary | ICD-10-CM | POA: Diagnosis not present

## 2020-07-31 DIAGNOSIS — R293 Abnormal posture: Secondary | ICD-10-CM

## 2020-07-31 DIAGNOSIS — M542 Cervicalgia: Secondary | ICD-10-CM
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
## 2020-08-01 ENCOUNTER — Ambulatory Visit: Payer: BC Managed Care – PPO | Admitting: Neurology

## 2020-08-07 ENCOUNTER — Encounter: Payer: Self-pay | Admitting: Rehabilitative and Restorative Service Providers"

## 2020-08-07 ENCOUNTER — Other Ambulatory Visit: Payer: Self-pay

## 2020-08-07 ENCOUNTER — Ambulatory Visit (INDEPENDENT_AMBULATORY_CARE_PROVIDER_SITE_OTHER): Payer: BC Managed Care – PPO | Admitting: Rehabilitative and Restorative Service Providers"

## 2020-08-07 DIAGNOSIS — M542 Cervicalgia: Secondary | ICD-10-CM

## 2020-08-07 DIAGNOSIS — R293 Abnormal posture: Secondary | ICD-10-CM

## 2020-08-07 DIAGNOSIS — M545 Low back pain, unspecified: Secondary | ICD-10-CM
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
## 2020-08-07 NOTE — Patient Instructions (Signed)
Access Code: [email or web address] URL: [email or web address] Date: 08/07/2020 Prepared by: Scot Jun  Exercises Supine Lower Trunk Rotation - 2 x daily - 7 x weekly - 1 sets - 5 reps - 15 hold Supine Bridge - 2 x daily - 7 x weekly - 3 sets - 10 reps - 2 hold Seated Scapular Retraction - 2 x daily - 7 x weekly - 1 sets - 15 reps - 5 hold Supine Chin Tuck - 2 x daily - 7 x weekly - 1 sets - 10 reps - 5 hold Supine Cervical Rotation AROM on Pillow - 2 x daily - 7 x weekly - 3 sets - 10 reps Standing Lumbar Extension - 2 x daily - 7 x weekly - 1-2 sets - 10 reps Standing Shoulder Row with Anchored Resistance - 2 x daily - 7 x weekly - 10 reps - 3 sets Shoulder Extension with Resistance - 2 x daily - 7 x weekly - 10 reps - 3 sets

## 2020-08-09 ENCOUNTER — Ambulatory Visit: Payer: BC Managed Care – PPO | Admitting: Family

## 2020-08-14 ENCOUNTER — Telehealth: Payer: Self-pay | Admitting: Family

## 2020-08-14 ENCOUNTER — Other Ambulatory Visit: Payer: Self-pay

## 2020-08-14 ENCOUNTER — Ambulatory Visit: Payer: BC Managed Care – PPO | Admitting: Orthopaedic Surgery

## 2020-08-14 ENCOUNTER — Telehealth (INDEPENDENT_AMBULATORY_CARE_PROVIDER_SITE_OTHER): Payer: BC Managed Care – PPO | Admitting: Family

## 2020-08-14 DIAGNOSIS — I10 Essential (primary) hypertension: Secondary | ICD-10-CM | POA: Diagnosis not present

## 2020-08-14 MED ORDER — LOSARTAN POTASSIUM-HCTZ 50-12.5 MG PO TABS: 1.0000 | ORAL_TABLET | Freq: Every day | ORAL | 0 refills | Status: DC

## 2020-08-14 MED ORDER — AMLODIPINE BESYLATE 5 MG PO TABS: 5.0000 mg | ORAL_TABLET | Freq: Every day | ORAL | 0 refills | Status: DC

## 2020-08-14 NOTE — Progress Notes (Signed)
Virtual Visit via Telephone Note  I connected with Carmen [name], on 08/14/2020 at 4:38 PM by telephone due to the COVID-19 pandemic and verified that I am speaking with the correct person using two identifiers.  Due to current restrictions/limitations of in-office visits due to the COVID-19 pandemic, this scheduled clinical appointment was converted to a telehealth visit.   Consent: I discussed the limitations, risks, security and privacy concerns of performing an evaluation and management service by telephone and the availability of in person appointments. I also discussed with the patient that there may be a patient responsible charge related to this service. The patient expressed understanding and agreed to proceed.   Location of Patient: Home  Location of Provider: [facility] participating in Telemedicine visit: Henderson [place], NP [place], [place]   History of Present Illness: Carmen Rosman. [name] is a 40 year-old female who presents for hypertension follow-up.     1. HYPERTENSION FOLLOW-UP: 07/15/2020: - Increasing Hydrochlorothiazide from 25 mg daily to 50 mg daily. - Adding Amlodipine for high blood pressure.   08/14/2020: Med Adherence: '[x]'  Yes    '[]'  No Medication side effects: '[x]'  Yes, increased urination Adherence with salt restriction (low-salt diet): Appointment with Dietician on tomorrow. No longer having sodas.  Exercise: began on last week Home Monitoring?: '[x]'  Yes    '[]'  No Monitoring Frequency: '[x]'  Yes    '[]'  No Home BP results range: '[x]'  Yes, 150's-160's/90's-100's Smoking '[]'  Yes '[x]'  No SOB? '[x]'  Yes, sometimes Chest Pain?: '[]'  Yes    '[x]'  No Leg swelling?: '[]'  Yes    '[x]'  No Headaches?: '[x]'  Yes    '[]'  No Dizziness? '[]'  Yes    '[x]'  No Comments: She is interested in combination pill.   Past Medical History:  Diagnosis Date  . Anemia   . Bilateral carpal tunnel syndrome   . Diabetes mellitus without  complication ([place])    Phreesia 07/15/2020  . Essential hypertension   . Fibroids    Uterine unspecified  . Genital herpes   . Heart murmur   . History of migraine headaches    Seen By Dr. Venia Minks  . IBS (irritable bowel syndrome)   . Migraine 07/11/2014  . Morbid obesity ([place])   . OSA (obstructive sleep apnea)   . Sleep apnea    Phreesia 07/15/2020   No Known Allergies  Current Outpatient Medications on File Prior to Visit  Medication Sig Dispense Refill  . albuterol (VENTOLIN HFA) 108 (90 Base) MCG/ACT inhaler Inhale into the lungs.    Marland Kitchen amLODipine (NORVASC) 5 MG tablet Take 1 tablet (5 mg total) by mouth daily. 30 tablet 0  . Blood Glucose Monitoring Suppl (TRUE METRIX METER) w/Device KIT Use as directed 1 kit 0  . cetirizine (ZYRTEC) 10 MG tablet Take 1 tablet (10 mg total) by mouth daily. 30 tablet 11  . Continuous Blood Gluc Sensor (FREESTYLE LIBRE SENSOR SYSTEM) MISC Change sensor Q 2 wks 2 each 12  . cyclobenzaprine (FLEXERIL) 10 MG tablet Take 1 tablet (10 mg total) by mouth 2 (two) times daily as needed for muscle spasms. 20 tablet 0  . dapagliflozin propanediol (FARXIGA) 5 MG TABS tablet Take 1 tablet (5 mg total) by mouth daily before breakfast. 30 tablet 0  . glucose blood (TRUE METRIX BLOOD GLUCOSE TEST) test strip Use as instructed 100 each 12  . hydrochlorothiazide (HYDRODIURIL) 50 MG tablet Take 1 tablet (50 mg total) by mouth  daily. 90 tablet 0  . hydrOXYzine (ATARAX/VISTARIL) 50 MG tablet TAKE 1/2 -1 FULL TAB AT BEDTIME AS NEEDED FOR INSOMIA 30 tablet 0  . ibuprofen (ADVIL) 800 MG tablet TAKE 1 TABLET BY MOUTH THREE TIMES A DAY 21 tablet 0  . ketoconazole (NIZORAL) 2 % shampoo Apply to scalp, leave on 5 minutes then rinse out.    Marland Kitchen levonorgestrel (MIRENA) 20 MCG/24HR IUD 1 each by Intrauterine route once. Implanted December 2014    . Multiple Vitamin (MULTIVITAMIN WITH MINERALS) TABS tablet Take 1 tablet by mouth daily.    Marland Kitchen omeprazole (PRILOSEC) 20 MG  capsule Take 1 capsule (20 mg total) by mouth daily. 30 capsule 3  . TRUEplus Lancets 28G MISC Use as directed 100 each 4  . valACYclovir (VALTREX) 500 MG tablet TAKE 1 TABLET (500 MG) BY MOUTH TWICE DAILY FOR 5 DAYS AS NEEDED FOR OUTBREAKS 30 tablet 11  . Continuous Blood Gluc Receiver (DEXCOM G6 RECEIVER) DEVI 1 Device by Does not apply route 4 (four) times daily -  before meals and at bedtime. 1 each 0  . Continuous Blood Gluc Sensor (DEXCOM G6 SENSOR) MISC 1 Device by Does not apply route 4 (four) times daily -  before meals and at bedtime. 1 each 0  . Continuous Blood Gluc Transmit (DEXCOM G6 TRANSMITTER) MISC 1 Device by Does not apply route 4 (four) times daily -  before meals and at bedtime. 1 each 0  . [DISCONTINUED] dicyclomine (BENTYL) 20 MG tablet Take 1 tablet (20 mg total) by mouth 2 (two) times daily. 20 tablet 0  . [DISCONTINUED] diltiazem (CARDIZEM CD) 120 MG 24 hr capsule Take 1 capsule (120 mg total) by mouth daily. 90 capsule 3   No current facility-administered medications on file prior to visit.    Observations/Objective: Alert and oriented x 3. Not in acute distress. Physical examination not completed as this is a telemedicine visit.  Assessment and Plan: 1. Essential hypertension: - Home blood pressures uncontrolled.  - Continue Amlodipine as prescribed.  - Begin Losartan-Hydrochlorothiazide as prescribed. - Counseled on blood pressure goal of less than 130/80, low-sodium, DASH diet, medication compliance, 150 minutes of moderate intensity exercise per week as tolerated. Discussed medication compliance, adverse effects. - Follow-up with primary provider in 2 weeks or sooner if needed.  - losartan-hydrochlorothiazide (HYZAAR) 50-12.5 MG tablet; Take 1 tablet by mouth daily.  Dispense: 30 tablet; Refill: 0 - amLODipine (NORVASC) 5 MG tablet; Take 1 tablet (5 mg total) by mouth daily.  Dispense: 30 tablet; Refill: 0   Follow Up Instructions: Follow-up with primary  provider in 2 weeks or sooner if needed.    Patient was given clear instructions to go to Emergency Department or return to medical center if symptoms don't improve, worsen, or new problems develop.The patient verbalized understanding.  I discussed the assessment and treatment plan with the patient. The patient was provided an opportunity to ask questions and all were answered. The patient agreed with the plan and demonstrated an understanding of the instructions.   The patient was advised to call back or seek an in-person evaluation if the symptoms worsen or if the condition fails to improve as anticipated.   I provided 15 minutes total of non-face-to-face time during this encounter including median intraservice time, reviewing previous notes, labs, imaging, medications, management and patient verbalized understanding.    [name], NP  [facility] [place], [place] 08/14/2020, 4:38 PM

## 2020-08-14 NOTE — Telephone Encounter (Signed)
Need Prior Auth Dexcom sensors states that you can call the [phone] number or online at [email or web address]

## 2020-08-14 NOTE — Progress Notes (Signed)
HTN F/U

## 2020-08-15 ENCOUNTER — Encounter: Payer: BC Managed Care – PPO | Attending: Family | Admitting: Dietician

## 2020-08-19 ENCOUNTER — Telehealth: Payer: Self-pay

## 2020-08-19 NOTE — Telephone Encounter (Signed)
PA started for Dexcom G6  Key#[id number]

## 2020-08-19 NOTE — Telephone Encounter (Signed)
Dexcom G6 Kit PA APPROVED-[id number]

## 2020-08-20 ENCOUNTER — Other Ambulatory Visit: Payer: Self-pay | Admitting: Physician Assistant

## 2020-08-20 DIAGNOSIS — R1013 Epigastric pain: Secondary | ICD-10-CM

## 2020-08-22 ENCOUNTER — Other Ambulatory Visit: Payer: Self-pay | Admitting: Family

## 2020-08-22 DIAGNOSIS — E119 Type 2 diabetes mellitus without complications: Secondary | ICD-10-CM

## 2020-08-23 ENCOUNTER — Ambulatory Visit: Payer: BC Managed Care – PPO | Admitting: Orthopaedic Surgery

## 2020-09-01 ENCOUNTER — Other Ambulatory Visit: Payer: Self-pay | Admitting: Physician Assistant

## 2020-09-01 DIAGNOSIS — M5442 Lumbago with sciatica, left side: Secondary | ICD-10-CM

## 2020-09-01 DIAGNOSIS — M5441 Lumbago with sciatica, right side: Secondary | ICD-10-CM

## 2020-09-01 DIAGNOSIS — M542 Cervicalgia: Secondary | ICD-10-CM

## 2020-09-02 NOTE — Telephone Encounter (Signed)
Ibuprofen refilled per patient request.

## 2020-09-04 ENCOUNTER — Other Ambulatory Visit: Payer: Self-pay | Admitting: Family

## 2020-09-04 DIAGNOSIS — E119 Type 2 diabetes mellitus without complications: Secondary | ICD-10-CM

## 2020-09-05 ENCOUNTER — Encounter: Payer: BC Managed Care – PPO | Admitting: Family Medicine

## 2020-09-19 ENCOUNTER — Other Ambulatory Visit: Payer: Self-pay | Admitting: Family

## 2020-09-19 DIAGNOSIS — E119 Type 2 diabetes mellitus without complications: Secondary | ICD-10-CM

## 2020-10-02 ENCOUNTER — Other Ambulatory Visit: Payer: Self-pay | Admitting: Family

## 2020-10-02 DIAGNOSIS — E119 Type 2 diabetes mellitus without complications: Secondary | ICD-10-CM

## 2020-10-28 ENCOUNTER — Ambulatory Visit: Payer: BC Managed Care – PPO | Admitting: Neurology

## 2020-11-07 ENCOUNTER — Telehealth: Payer: Self-pay | Admitting: Family

## 2020-11-07 ENCOUNTER — Other Ambulatory Visit: Payer: Self-pay

## 2020-11-07 DIAGNOSIS — E119 Type 2 diabetes mellitus without complications: Secondary | ICD-10-CM

## 2020-11-07 DIAGNOSIS — I10 Essential (primary) hypertension: Secondary | ICD-10-CM

## 2020-11-07 MED ORDER — LOSARTAN POTASSIUM-HCTZ 50-12.5 MG PO TABS: 1.0000 | ORAL_TABLET | Freq: Every day | ORAL | 0 refills | Status: DC

## 2020-11-07 MED ORDER — AMLODIPINE BESYLATE 5 MG PO TABS: 5.0000 mg | ORAL_TABLET | Freq: Every day | ORAL | 0 refills | Status: DC

## 2020-11-07 MED ORDER — DAPAGLIFLOZIN PROPANEDIOL 5 MG PO TABS: ORAL_TABLET | ORAL | 0 refills | Status: DC

## 2020-11-07 NOTE — Telephone Encounter (Signed)
1) Medication(s) Requested (by name): FARXIGA 5 MG TABS tablet   losartan-hydrochlorothiazide (HYZAAR) 50-12.5 MG tablet  amLODipine (NORVASC) 5 MG tablet  2) Pharmacy of Choice:  [id number] - [place], [place].   3) Special Requests:   Approved medications will be sent to the pharmacy, we will reach out if there is an issue.  Requests made after 3pm may not be addressed until the following business day!  If a patient is unsure of the name of the medication(s) please note and ask patient to call back when they are able to provide all info, do not send to responsible party until all information is available!

## 2020-11-07 NOTE — Progress Notes (Signed)
30 day courtesy refill for Carmen [name], and amlodipine future refills need appt

## 2020-11-11 ENCOUNTER — Other Ambulatory Visit: Payer: Self-pay | Admitting: Family

## 2020-11-11 DIAGNOSIS — E119 Type 2 diabetes mellitus without complications: Secondary | ICD-10-CM

## 2020-11-12 ENCOUNTER — Encounter: Payer: Self-pay | Admitting: Physician Assistant

## 2020-11-13 ENCOUNTER — Telehealth: Payer: Self-pay | Admitting: Family

## 2020-11-13 NOTE — Telephone Encounter (Signed)
Patient called in needing a refill. Patient is asking if she can have more than one refill at a time, so she does not have to call in everytime. She is changing the pharmacy as well.  [facility] ([facility]) [address], [place], [place] [address] [phone]

## 2020-11-14 ENCOUNTER — Other Ambulatory Visit: Payer: Self-pay

## 2020-11-14 DIAGNOSIS — E119 Type 2 diabetes mellitus without complications: Secondary | ICD-10-CM

## 2020-11-14 MED ORDER — DEXCOM G6 SENSOR MISC: 0 refills | Status: DC

## 2020-11-14 MED ORDER — DEXCOM G6 SENSOR MISC: 0 refills | Status: AC

## 2020-11-14 NOTE — Telephone Encounter (Signed)
Glucose sensors refilled

## 2020-11-14 NOTE — Progress Notes (Signed)
Pharmacy updated.

## 2020-11-21 ENCOUNTER — Encounter: Payer: Self-pay | Admitting: Surgery

## 2020-11-21 ENCOUNTER — Ambulatory Visit (INDEPENDENT_AMBULATORY_CARE_PROVIDER_SITE_OTHER): Payer: BC Managed Care – PPO | Admitting: Surgery

## 2020-11-21 VITALS — Ht 66.3 in | Wt 273.8 lb

## 2020-11-21 DIAGNOSIS — M5412 Radiculopathy, cervical region: Secondary | ICD-10-CM | POA: Diagnosis not present

## 2020-11-21 DIAGNOSIS — M7062 Trochanteric bursitis, left hip: Secondary | ICD-10-CM

## 2020-11-21 DIAGNOSIS — M5416 Radiculopathy, lumbar region: Secondary | ICD-10-CM
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
## 2020-11-29 ENCOUNTER — Telehealth: Payer: Self-pay | Admitting: Surgery

## 2020-11-29 NOTE — Telephone Encounter (Signed)
Pt called requesting medication for upcoming MRI. Pt states she is claustrophobic and need meds to keep her calm. Please send to pharmacy on file. Pt phone number is [phone].

## 2020-12-02 MED ORDER — DIAZEPAM 5 MG PO TABS: ORAL_TABLET | ORAL | 0 refills | Status: DC

## 2020-12-02 NOTE — Telephone Encounter (Signed)
Called to pharmacy. I called patient and advised. 

## 2020-12-03 ENCOUNTER — Telehealth: Payer: Self-pay | Admitting: Orthopaedic Surgery

## 2020-12-03 NOTE — Telephone Encounter (Signed)
Called pt and left 1X vm for pt to call and set MRI review appt with Dr. Lorin Mercy after 7/20. Will try again another time

## 2020-12-04 ENCOUNTER — Ambulatory Visit
Admission: RE | Admit: 2020-12-04 | Discharge: 2020-12-04 | Disposition: A | Discharge status: Home / Self Care | Payer: BC Managed Care – PPO | Source: Ambulatory Visit | Attending: Surgery | Admitting: Surgery

## 2020-12-04 ENCOUNTER — Other Ambulatory Visit: Payer: Self-pay

## 2020-12-04 DIAGNOSIS — M5416 Radiculopathy, lumbar region: Secondary | ICD-10-CM

## 2020-12-04 DIAGNOSIS — M5412 Radiculopathy, cervical region: Secondary | ICD-10-CM

## 2020-12-04 IMAGING — MR MR CERVICAL SPINE W/O CM
4 of 5 series · 29 of 48 positions shown · non-contrast
Comparison: None.

CLINICAL DATA: Cervical radiculopathy.

EXAM:
MRI CERVICAL SPINE WITHOUT CONTRAST
TECHNIQUE: Multiplanar, multisequence MR imaging of the cervical spine was
performed. No intravenous contrast was administered.

[Series 3: T2 · sagittal · 3.0mm · 0.66mm/px · 8 of 18 slices shown (1 of 2)]
[im 1/18]
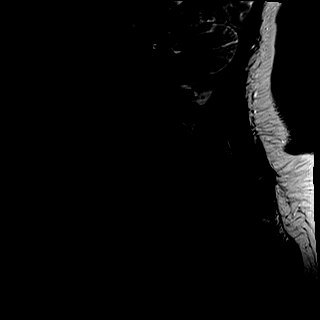
[im 3/18]
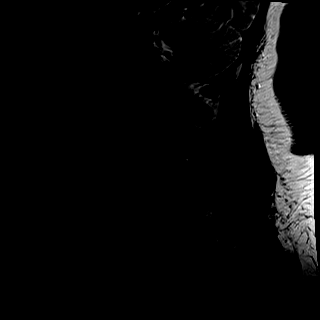
[im 5/18]
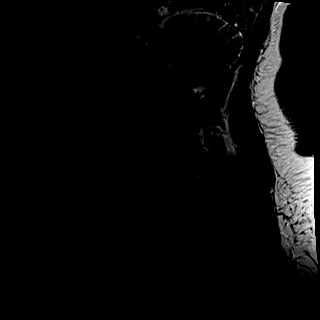
[im 8/18]
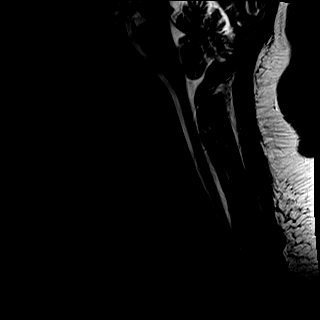
[im 10/18]
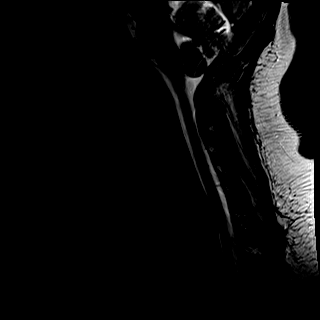
[im 13/18]
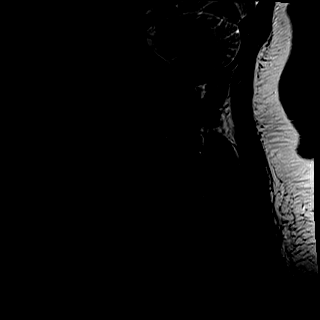
[im 15/18]
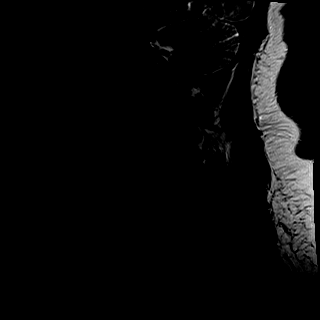
[im 18/18]
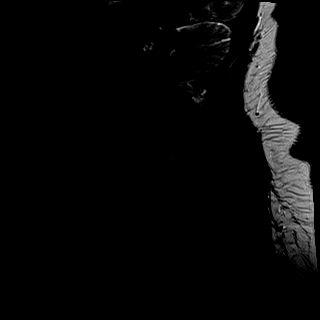

[Series 4: T1 · sagittal · 3.0mm · 0.41mm/px · 8 of 18 slices shown]
[im 1/18]
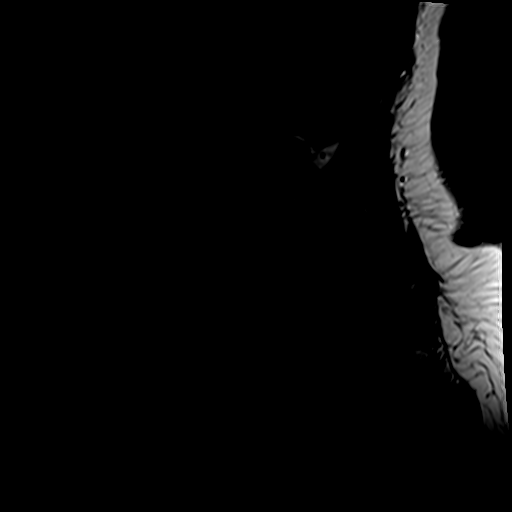
[im 3/18]
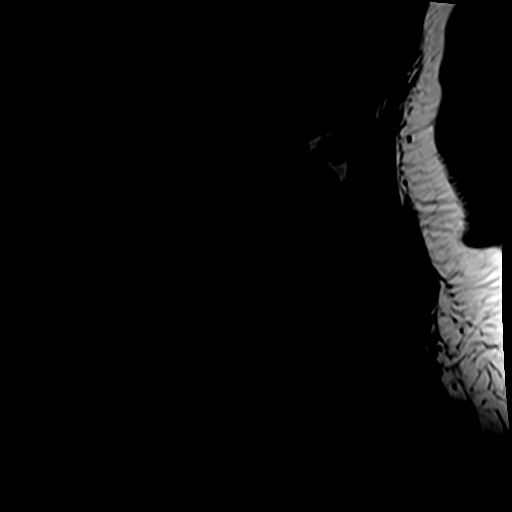
[im 5/18]
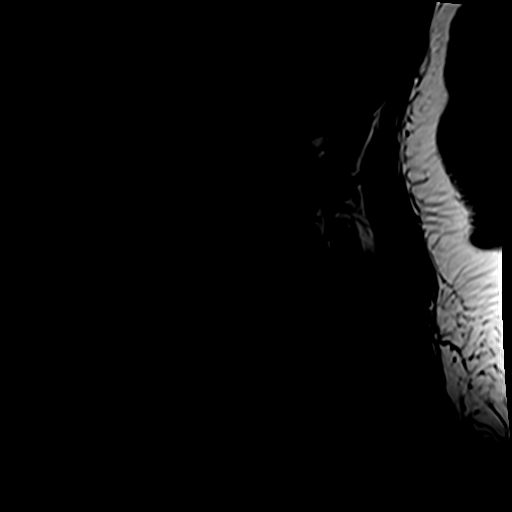
[im 8/18]
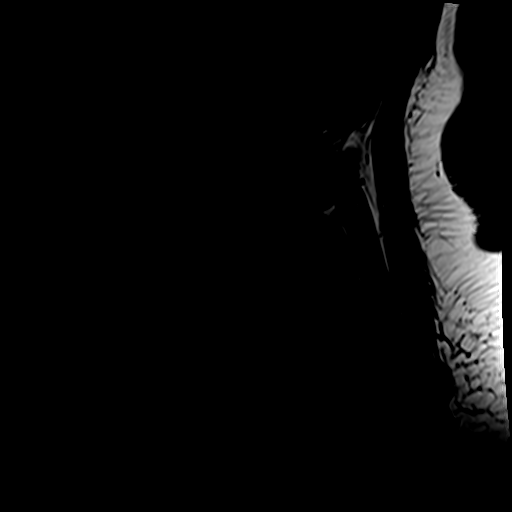
[im 10/18]
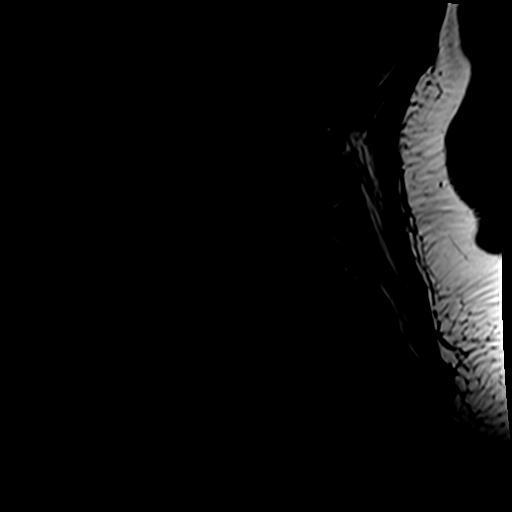
[im 13/18]
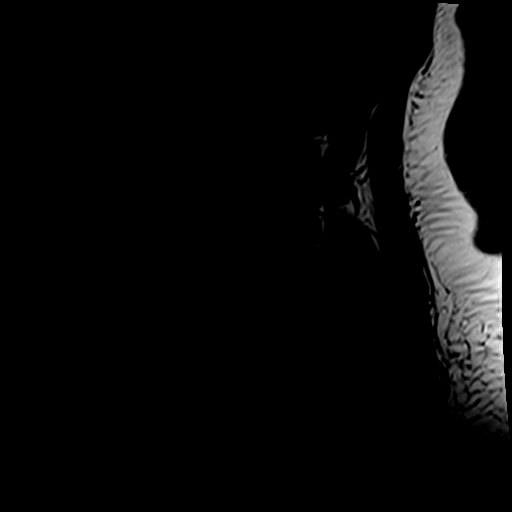
[im 15/18]
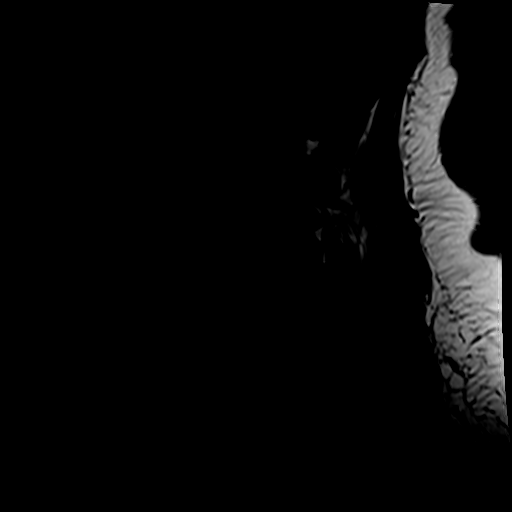
[im 18/18]
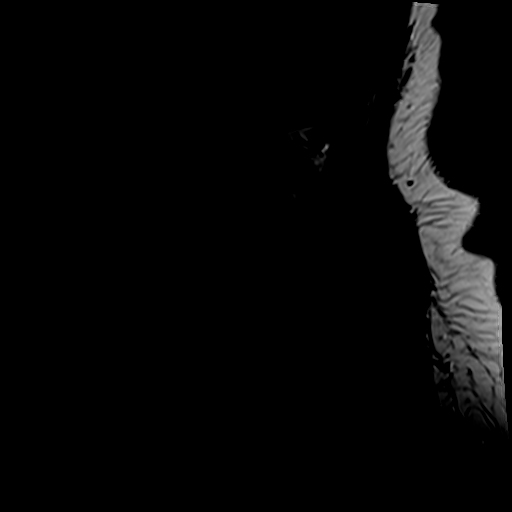

[Series 5: tir sag · sagittal · 3.0mm · 0.41mm/px · 4 of 18 slices shown]
[im 1/18]
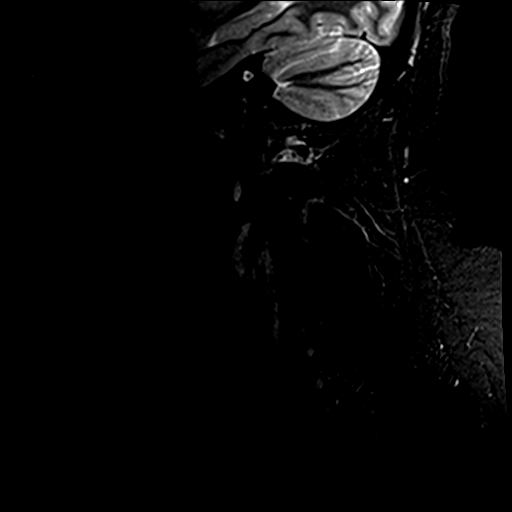
[im 3/18]
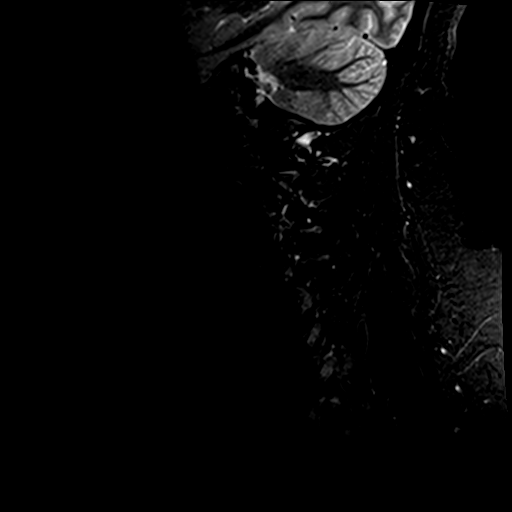
[im 10/18]
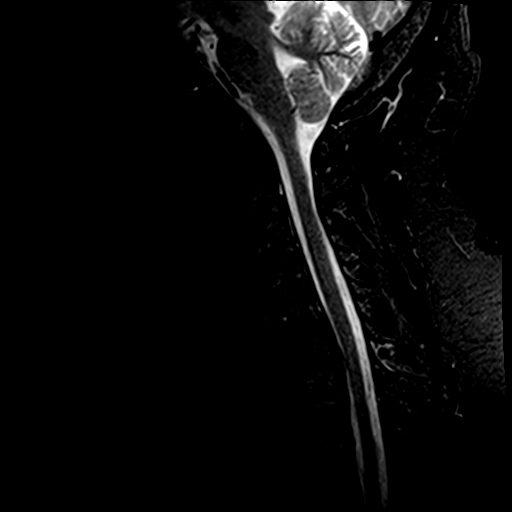
[im 15/18]
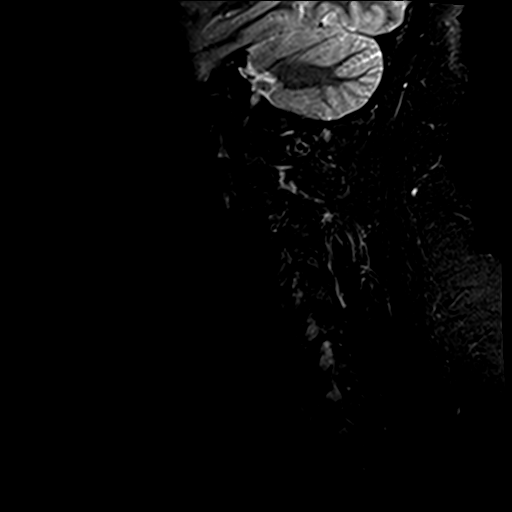

[Series 7: T2 · axial · 3.0mm · 0.70mm/px · z∈[-78,+11]mm · 9 of 28 slices shown (2 of 2)]
[im 1/28]
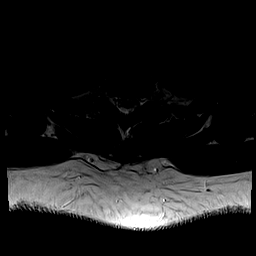
[im 5/28]
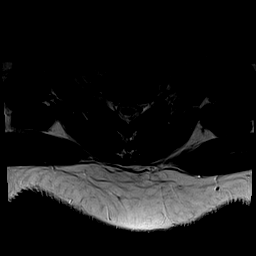
[im 8/28]
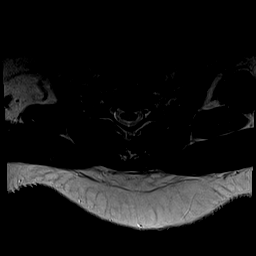
[im 13/28]
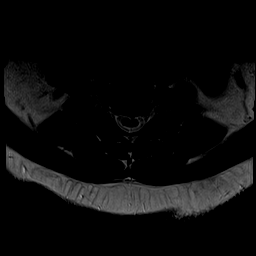
[im 15/28]
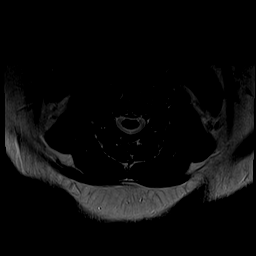
[im 20/28]
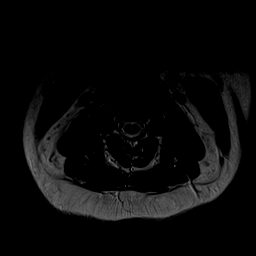
[im 23/28]
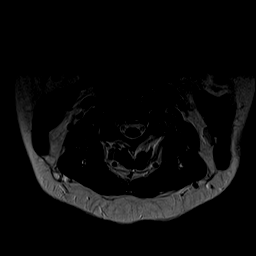
[im 25/28]
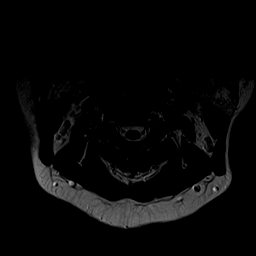
[im 28/28]
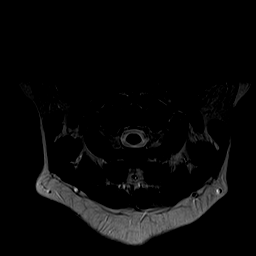

[29 of 48 positions shown; findings below may reference images not displayed]

FINDINGS: Motion limited evaluation.  Within this limitation:

Alignment: Reversal of the normal cervical lordosis. No substantial
sagittal subluxation.

Vertebrae: Vertebral body heights are maintained. No specific
evidence of acute fracture, discitis/osteomyelitis, or suspicious
bone lesion.

Cord: Normal cord signal.

Posterior Fossa, vertebral arteries, paraspinal tissues: Vertebral
body heights are maintained. No specific evidence of acute fracture,
discitis/osteomyelitis, or suspicious bone lesion.

Disc levels:

C2-C3: No significant disc protrusion, foraminal stenosis, or canal
stenosis.

C3-C4: Small posterior disc osteophyte complex and mild bilateral
facet hypertrophy without significant canal or foraminal stenosis.

C4-C5: Small posterior disc osteophyte complex and mild bilateral
facet hypertrophy without significant canal or foraminal stenosis.

C5-C6: Small posterior disc osteophyte complex with superimposed
left paracentral disc protrusion which contacts and flattens the
left ventral cord without significant canal stenosis. No significant
foraminal stenosis.

C6-C7: Posterior disc osteophyte complex contacts and flattens the
ventral cord without significant canal or foraminal stenosis.

C7-T1: Central posterior disc protrusion without significant canal
or foraminal stenosis.
IMPRESSION: Motion limited study.

1. Posterior disc protrusions contact and flatten ventral cord at
C5-C6 and C6-C7 without significant canal stenosis.
2. No significant foraminal stenosis.

## 2020-12-04 IMAGING — MR MR LUMBAR SPINE W/O CM
4 of 5 series · 26 of 48 positions shown · non-contrast
Comparison: Lumbar radiographs [DATE].

CLINICAL DATA: Low back pain.  Lumbar radiculopathy.

EXAM:
MRI LUMBAR SPINE WITHOUT CONTRAST
TECHNIQUE: Multiplanar, multisequence MR imaging of the lumbar spine was
performed. No intravenous contrast was administered.

[Series 3: T2 · sagittal · 4.0mm · 0.53mm/px · 6 of 16 slices shown (1 of 2)]
[im 1/16]
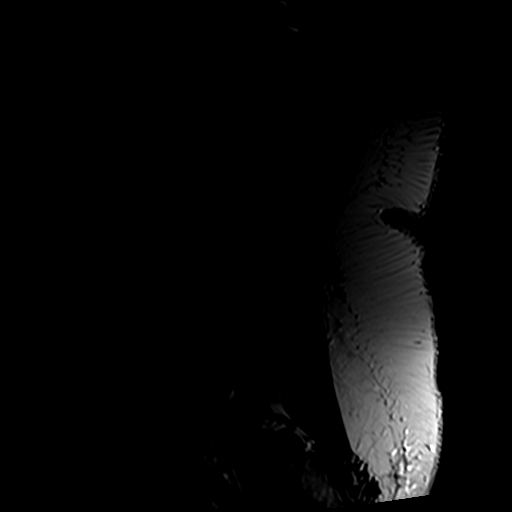
[im 4/16]
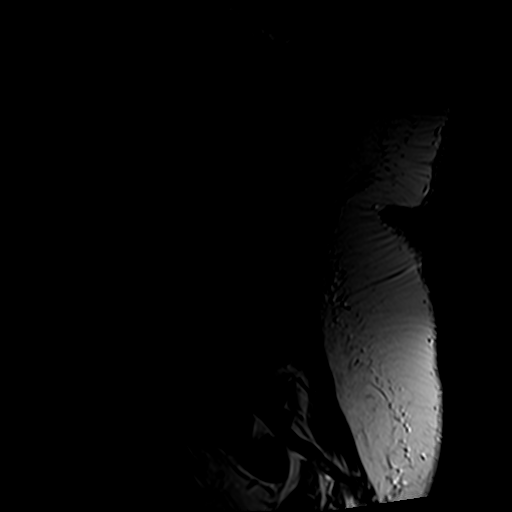
[im 7/16]
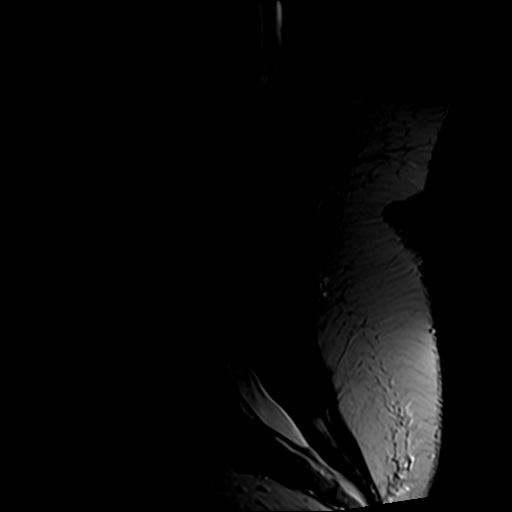
[im 10/16]
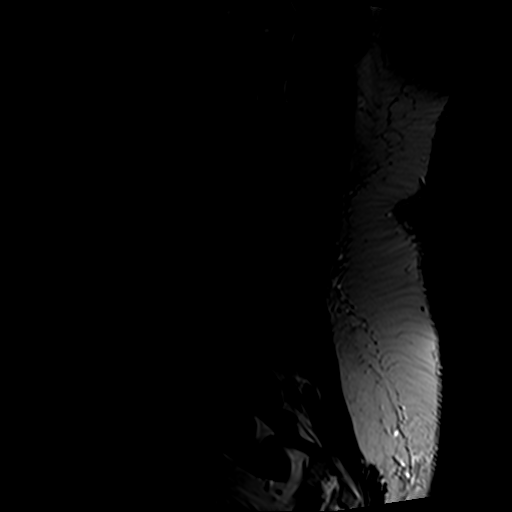
[im 13/16]
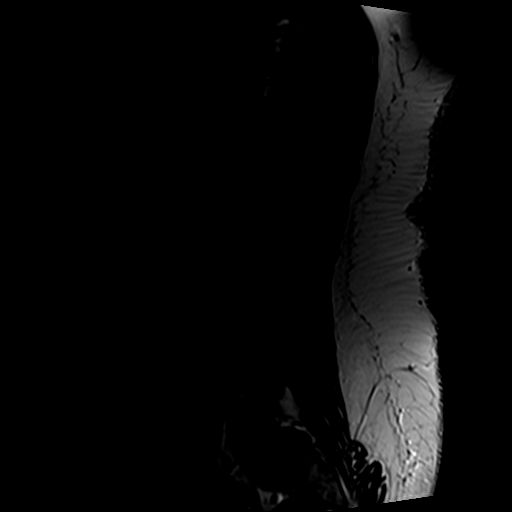
[im 16/16]
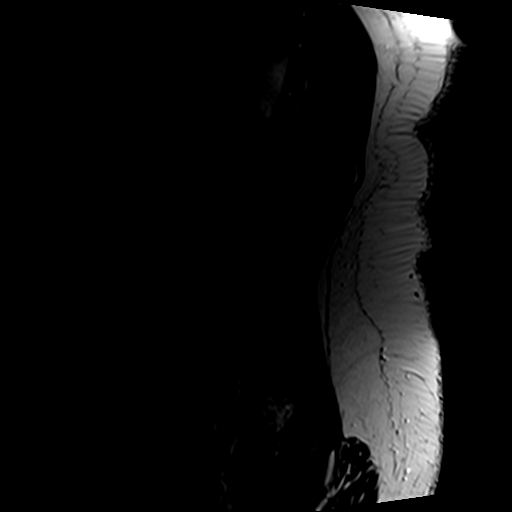

[Series 5: T1 · sagittal · 4.0mm · 0.53mm/px · 6 of 16 slices shown (1 of 2)]
[im 1/16]
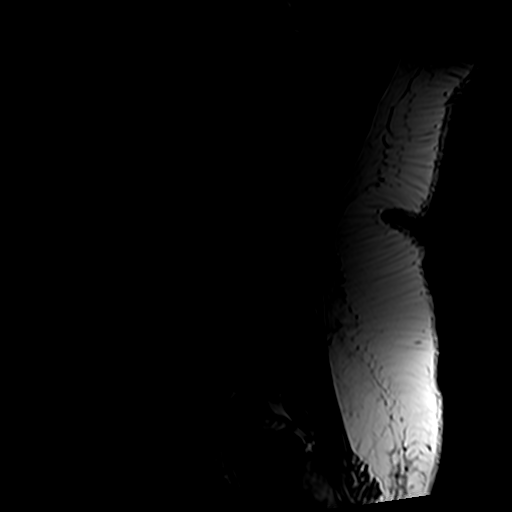
[im 4/16]
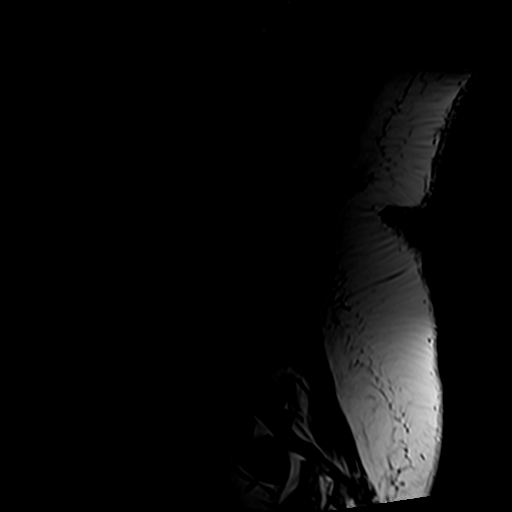
[im 7/16]
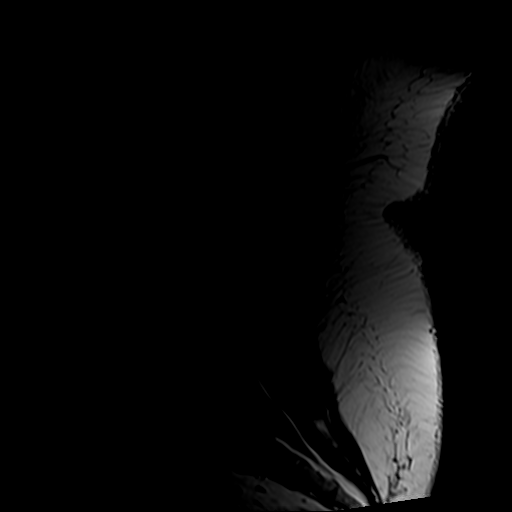
[im 10/16]
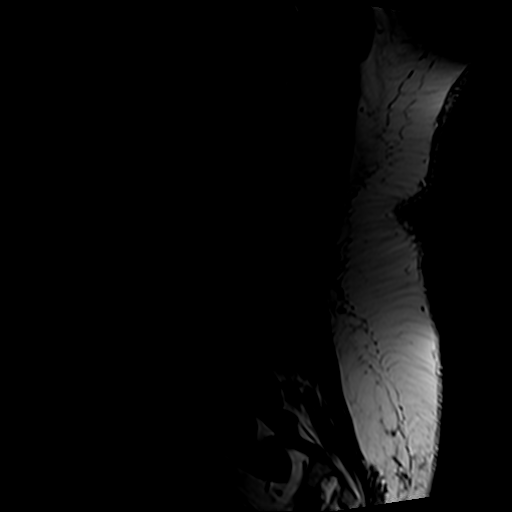
[im 13/16]
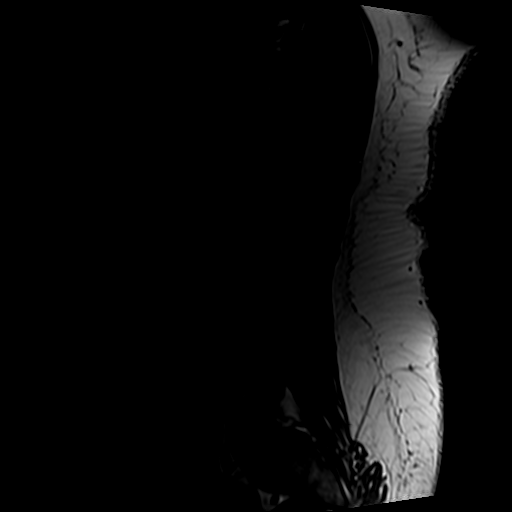
[im 16/16]
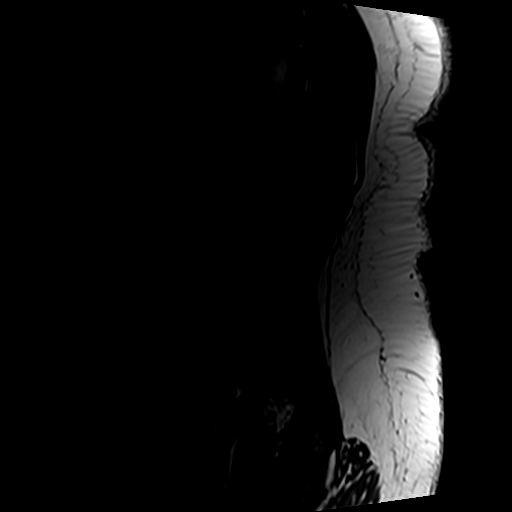

[Series 6: T2 · axial · 4.0mm · 0.70mm/px · z∈[-55,+170]mm · 9 of 40 slices shown (2 of 2)]
[im 1/40]
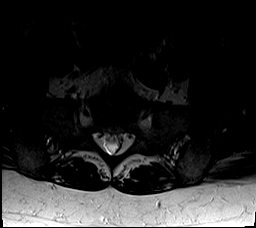
[im 6/40]
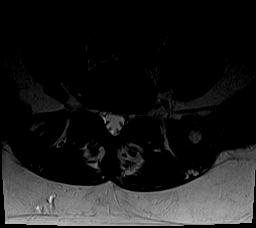
[im 12/40]
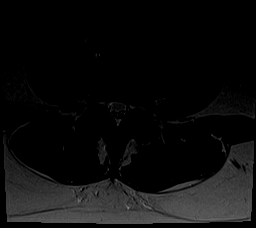
[im 17/40]
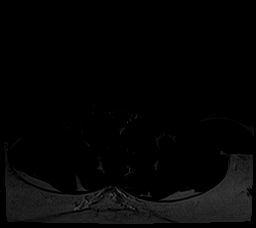
[im 20/40]
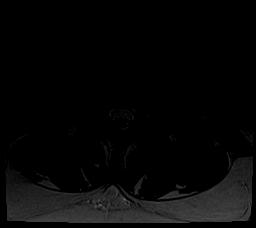
[im 23/40]
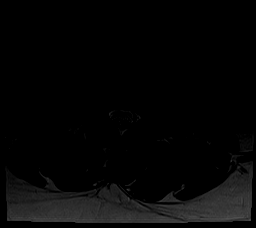
[im 28/40]
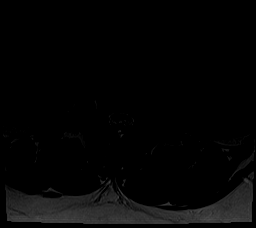
[im 34/40]
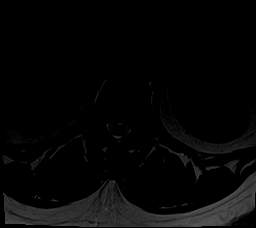
[im 40/40]
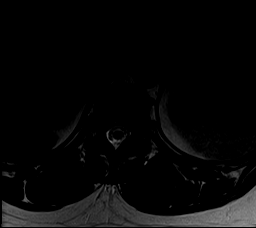

[Series 7: T1 · axial · 4.0mm · 0.35mm/px · z∈[-55,+139]mm · 5 of 40 slices shown (2 of 2)]
[im 1/40]
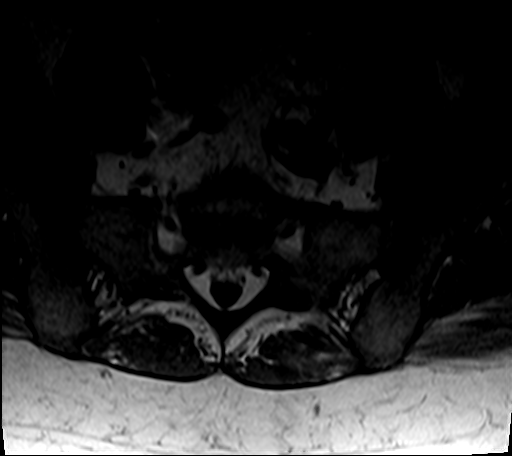
[im 6/40]
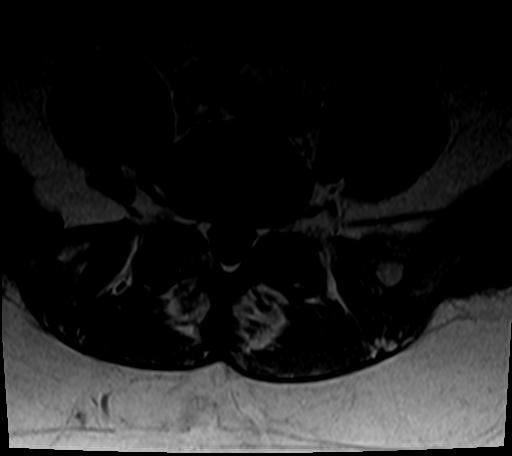
[im 12/40]
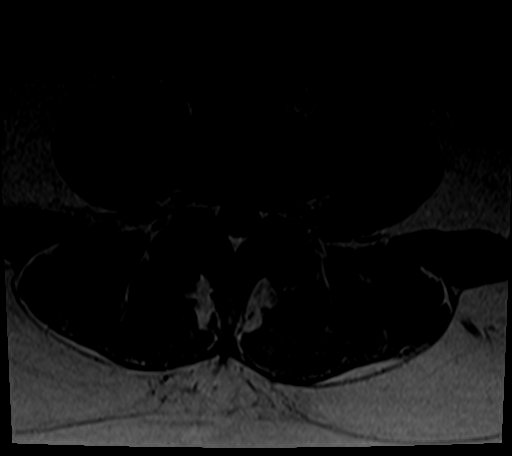
[im 20/40]
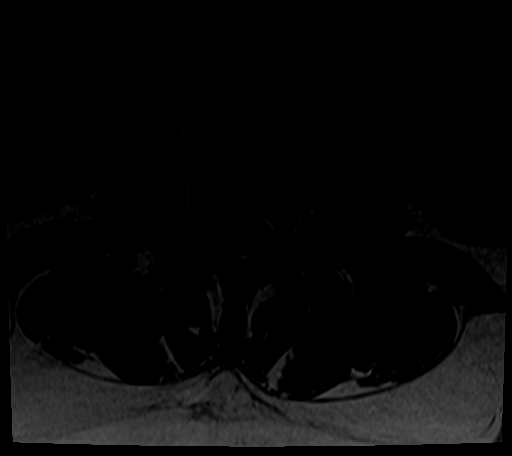
[im 34/40]
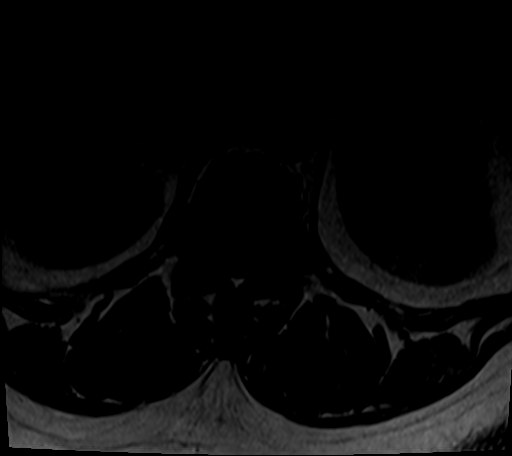

[26 of 48 positions shown; findings below may reference images not displayed]

FINDINGS: Segmentation: Transitional lumbosacral anatomy. For the purposes of
this dictation there are small, hypoplastic ribs at L1.

Alignment:  Slight retrolisthesis of L5 on S1.

Vertebrae: Vertebral body heights are maintained. No specific
evidence of acute fracture or discitis/osteomyelitis. No suspicious
bone lesions.

Conus medullaris and cauda equina: Conus extends to the L2 level.
Conus appears normal.

Paraspinal and other soft tissues: Unremarkable.

Disc levels:

Motion limited evaluation.  Within this limitation:

T12-L1: No significant disc protrusion, foraminal stenosis, or canal
stenosis.

L1-L2: Mild bilateral facet hypertrophy without significant canal or
foraminal stenosis.

L2-L3: Mild bilateral facet hypertrophy without significant canal or
foraminal stenosis.

L3-L4: Mild disc bulging and mild to moderate bilateral facet
hypertrophy without significant canal or foraminal stenosis.

L4-L5: Mild disc bulging and moderate bilateral facet hypertrophy
with mild-to-moderate left foraminal stenosis. No significant canal
or right foraminal stenosis.

L5-S1: Disc desiccation and height loss. Broad disc bulge with small
central disc protrusion. Moderate bilateral facet hypertrophy.
Resulting mild to moderate left and mild right foraminal stenosis.
IMPRESSION: 1. Transitional lumbosacral anatomy. For the purposes of this
dictation there are small, hypoplastic ribs at L1 and the conus
terminates at L2.
2. Mild to moderate foraminal stenosis on the left at L4-L5 and
L5-S1. Mild right foraminal stenosis at L5-S1.
3. No significant canal stenosis.

## 2020-12-05 ENCOUNTER — Other Ambulatory Visit: Payer: Self-pay

## 2020-12-05 ENCOUNTER — Telehealth: Payer: Self-pay | Admitting: Family

## 2020-12-05 DIAGNOSIS — E119 Type 2 diabetes mellitus without complications: Secondary | ICD-10-CM

## 2020-12-05 DIAGNOSIS — I10 Essential (primary) hypertension: Secondary | ICD-10-CM

## 2020-12-05 MED ORDER — LOSARTAN POTASSIUM-HCTZ 50-12.5 MG PO TABS
1.0000 | ORAL_TABLET | Freq: Every day | ORAL | 0 refills | Status: AC
Start: 2020-12-05 — End: ?

## 2020-12-05 MED ORDER — DEXCOM G6 TRANSMITTER MISC: 1.0000 | Freq: Three times a day (TID) | 0 refills | Status: DC

## 2020-12-05 MED ORDER — DAPAGLIFLOZIN PROPANEDIOL 5 MG PO TABS
ORAL_TABLET | ORAL | 0 refills | Status: DC
Start: 2020-12-05 — End: 2021-01-24

## 2020-12-05 NOTE — Telephone Encounter (Signed)
Refill request honored, put will need to schedule in office diabetes check and labs or no further refills

## 2020-12-05 NOTE — Progress Notes (Signed)
Refill request honored no further refills until in office appt is completed

## 2020-12-05 NOTE — Telephone Encounter (Signed)
Pt requesting refills for  Continuous Blood Gluc Transmit (DEXCOM G6 TRANSMITTER) MISC [[id number]]  (This is needing to get replaced. Said it's not working well).   losartan-hydrochlorothiazide (HYZAAR) 50-12.5 MG tablet [[id number]]  dapagliflozin propanediol (FARXIGA) 5 MG TABS tablet [[id number]]   Pharmacy  [facility] [phone] - [place], [address]  [place], [place] [address]  Phone:  [phone]  Fax:  [phone]     Thank you

## 2020-12-10 ENCOUNTER — Emergency Department (HOSPITAL_BASED_OUTPATIENT_CLINIC_OR_DEPARTMENT_OTHER)
Admission: EM | Admit: 2020-12-10 | Discharge: 2020-12-10 | Disposition: A | Discharge status: Home / Self Care | Payer: BC Managed Care – PPO | Attending: Emergency Medicine | Admitting: Emergency Medicine

## 2020-12-10 ENCOUNTER — Other Ambulatory Visit: Payer: Self-pay

## 2020-12-10 ENCOUNTER — Encounter (HOSPITAL_BASED_OUTPATIENT_CLINIC_OR_DEPARTMENT_OTHER): Payer: Self-pay | Admitting: *Deleted

## 2020-12-10 DIAGNOSIS — R202 Paresthesia of skin: Secondary | ICD-10-CM | POA: Insufficient documentation

## 2020-12-10 DIAGNOSIS — M542 Cervicalgia: Secondary | ICD-10-CM | POA: Insufficient documentation

## 2020-12-10 DIAGNOSIS — R079 Chest pain, unspecified: Secondary | ICD-10-CM | POA: Insufficient documentation

## 2020-12-10 DIAGNOSIS — Z5321 Procedure and treatment not carried out due to patient leaving prior to being seen by health care provider: Secondary | ICD-10-CM | POA: Insufficient documentation

## 2020-12-10 NOTE — ED Triage Notes (Signed)
Neck pain x 2 days. She was seen by her MD today for the pain. She also had a tingling sensation in her chest and both arms. She had an EKG in the office and recommended she come here. She was told from an MRI last week she may have a bulging disc.

## 2020-12-17 ENCOUNTER — Other Ambulatory Visit: Payer: Self-pay

## 2020-12-17 ENCOUNTER — Ambulatory Visit (INDEPENDENT_AMBULATORY_CARE_PROVIDER_SITE_OTHER): Payer: BC Managed Care – PPO | Admitting: Orthopaedic Surgery

## 2020-12-17 ENCOUNTER — Encounter: Payer: Self-pay | Admitting: Orthopaedic Surgery

## 2020-12-17 DIAGNOSIS — M5020 Other cervical disc displacement, unspecified cervical region: Secondary | ICD-10-CM | POA: Diagnosis not present
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
## 2020-12-19 ENCOUNTER — Telehealth: Payer: Self-pay | Admitting: Orthopaedic Surgery

## 2020-12-19 NOTE — Telephone Encounter (Signed)
Please advise 

## 2020-12-19 NOTE — Telephone Encounter (Signed)
noted 

## 2020-12-19 NOTE — Telephone Encounter (Signed)
Pt called asking for her surgery date to be sometime in Dec of 2022 and what can she do in the meantime for pain. Please call pt about this matter at [phone].

## 2020-12-20 DIAGNOSIS — M5020 Other cervical disc displacement, unspecified cervical region: Secondary | ICD-10-CM | POA: Insufficient documentation

## 2020-12-30 ENCOUNTER — Ambulatory Visit: Payer: BC Managed Care – PPO | Admitting: Neurology

## 2021-01-01 ENCOUNTER — Other Ambulatory Visit: Payer: Self-pay

## 2021-01-01 ENCOUNTER — Ambulatory Visit: Payer: BC Managed Care – PPO | Admitting: Physician Assistant

## 2021-01-23 ENCOUNTER — Other Ambulatory Visit: Payer: Self-pay | Admitting: Family

## 2021-01-23 DIAGNOSIS — E119 Type 2 diabetes mellitus without complications: Secondary | ICD-10-CM

## 2021-02-19 ENCOUNTER — Other Ambulatory Visit: Payer: Self-pay

## 2021-02-19 ENCOUNTER — Encounter: Payer: Self-pay | Admitting: Emergency Medicine

## 2021-02-19 ENCOUNTER — Ambulatory Visit
Admission: EM | Admit: 2021-02-19 | Discharge: 2021-02-19 | Disposition: A | Discharge status: Home / Self Care | Payer: BC Managed Care – PPO | Attending: Physician Assistant | Admitting: Physician Assistant

## 2021-02-19 DIAGNOSIS — R42 Dizziness and giddiness: Secondary | ICD-10-CM

## 2021-02-19 DIAGNOSIS — E118 Type 2 diabetes mellitus with unspecified complications: Secondary | ICD-10-CM

## 2021-02-19 DIAGNOSIS — E119 Type 2 diabetes mellitus without complications: Secondary | ICD-10-CM

## 2021-02-19 LAB — POCT FASTING CBG KUC MANUAL ENTRY: POCT Glucose (KUC): 66 mg/dL — AB (ref 70–99)

## 2021-02-19 LAB — POCT URINE PREGNANCY: Preg Test, Ur: NEGATIVE

## 2021-02-19 NOTE — ED Triage Notes (Signed)
Patient c/o dizziness, nausea and low blood sugar since Monday.  Blood sugar was 89 today after eating.  Patient is Type II Diabetic.
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
## 2021-02-20 LAB — CBC WITH DIFFERENTIAL/PLATELET
Basophils Absolute: 0.1 10*3/uL (ref 0.0–0.2)
Basos: 1 %
EOS (ABSOLUTE): 0.5 10*3/uL — ABNORMAL HIGH (ref 0.0–0.4)
Eos: 8 %
Hematocrit: 41.7 % (ref 34.0–46.6)
Hemoglobin: 13.4 g/dL (ref 11.1–15.9)
Immature Grans (Abs): 0 10*3/uL (ref 0.0–0.1)
Immature Granulocytes: 0 %
Lymphocytes Absolute: 2.4 10*3/uL (ref 0.7–3.1)
Lymphs: 43 %
MCH: 26.4 pg — ABNORMAL LOW (ref 26.6–33.0)
MCHC: 32.1 g/dL (ref 31.5–35.7)
MCV: 82 fL (ref 79–97)
Monocytes Absolute: 0.4 10*3/uL (ref 0.1–0.9)
Monocytes: 7 %
Neutrophils Absolute: 2.3 10*3/uL (ref 1.4–7.0)
Neutrophils: 41 %
Platelets: 390 10*3/uL (ref 150–450)
RBC: 5.08 x10E6/uL (ref 3.77–5.28)
RDW: 13.2 % (ref 11.7–15.4)
WBC: 5.7 10*3/uL (ref 3.4–10.8)

## 2021-02-20 LAB — COMPREHENSIVE METABOLIC PANEL
ALT: 14 IU/L (ref 0–32)
AST: 14 IU/L (ref 0–40)
Albumin/Globulin Ratio: 1.2 (ref 1.2–2.2)
Albumin: 4.4 g/dL (ref 3.8–4.8)
Alkaline Phosphatase: 99 IU/L (ref 44–121)
BUN/Creatinine Ratio: 10 (ref 9–23)
BUN: 9 mg/dL (ref 6–24)
Bilirubin Total: 0.3 mg/dL (ref 0.0–1.2)
CO2: 24 mmol/L (ref 20–29)
Calcium: 9.6 mg/dL (ref 8.7–10.2)
Chloride: 105 mmol/L (ref 96–106)
Creatinine, Ser: 0.91 mg/dL (ref 0.57–1.00)
Globulin, Total: 3.6 g/dL (ref 1.5–4.5)
Glucose: 80 mg/dL (ref 70–99)
Potassium: 3.9 mmol/L (ref 3.5–5.2)
Sodium: 143 mmol/L (ref 134–144)
Total Protein: 8 g/dL (ref 6.0–8.5)
eGFR: 81 mL/min/{1.73_m2} (ref >59)

## 2021-02-20 LAB — HEMOGLOBIN A1C
Est. average glucose Bld gHb Est-mCnc: 131 mg/dL
Hgb A1c MFr Bld: 6.2 % — ABNORMAL HIGH (ref 4.8–5.6)

## 2021-02-24 ENCOUNTER — Other Ambulatory Visit: Payer: Self-pay

## 2021-02-24 ENCOUNTER — Ambulatory Visit (INDEPENDENT_AMBULATORY_CARE_PROVIDER_SITE_OTHER): Payer: BC Managed Care – PPO | Admitting: Nurse Practitioner

## 2021-02-24 ENCOUNTER — Encounter: Payer: Self-pay | Admitting: Nurse Practitioner

## 2021-02-24 VITALS — BP 138/89 | HR 68 | Temp 98.5°F | Resp 16

## 2021-02-24 DIAGNOSIS — I10 Essential (primary) hypertension: Secondary | ICD-10-CM | POA: Diagnosis not present

## 2021-02-24 DIAGNOSIS — R202 Paresthesia of skin: Secondary | ICD-10-CM | POA: Diagnosis not present

## 2021-02-24 DIAGNOSIS — E119 Type 2 diabetes mellitus without complications: Secondary | ICD-10-CM | POA: Diagnosis not present

## 2021-02-24 MED ORDER — ONDANSETRON 4 MG PO TBDP: 4.0000 mg | ORAL_TABLET | Freq: Three times a day (TID) | ORAL | 0 refills | Status: AC | PRN

## 2021-02-24 MED ORDER — AMLODIPINE BESYLATE 5 MG PO TABS: 5.0000 mg | ORAL_TABLET | Freq: Every day | ORAL | 0 refills | Status: AC

## 2021-02-24 MED ORDER — PREDNISONE 20 MG PO TABS: 20.0000 mg | ORAL_TABLET | Freq: Every day | ORAL | 0 refills | Status: AC

## 2021-02-24 NOTE — Assessment & Plan Note (Signed)
Continue Hydrochlorothiazide from 25 mg daily to 50 mg daily  Adding Amlodipine for high blood pressure  Diabetes Hypoglycemia:  Blood Glucose today 88  Will schedule appointment with Lurena Joiner for diabetic medication management  Please hold diabetic medication if blood sugar is low  6 small meals per day high protein, low carb  Facial tingling:  Will order prednisone  Please go to the ED if symptoms worsen  Follow up:  Follow up with PCP in 2 weeks or sooner if needed

## 2021-02-24 NOTE — Progress Notes (Signed)
'@Patient'  ID: Carmen [name], female    DOB: 1981/04/17, 40 y.o.   MRN: [id number]  Chief Complaint  Patient presents with   Hypertension    Referring provider: [name], NP   HPI  Patient presents today for elevated blood pressure.  She states over the past week her blood pressures have been elevated and her blood sugar has been dropping.  We discussed her medications and she is supposed to be on amlodipine which she has not been taking.  She is compliant with Hyzaar.  I will refill her amlodipine today.  Patient's blood pressure was within normal range in the office today.  Also patient has been noticing drops in her blood sugar.  Her blood sugar was within normal range in office today.  She has been nauseated and has not been eating well over the past couple weeks.  She has been seen in the ED for this issue and had complete lab work-up.  Her labs are overall normal.  Hemoglobin A1c was 6.2.  We will order nausea medication for her.  She also complains today of facial tingling to the left side of her face. We discussed that facial tingling could be from hypoglycemia. Patient also has history of numbness and tingling to left side from bulging cervical disc. Discussed that we can order prednisone, but will need to go to the ED if symptoms worsen.  Patient does have an appointment scheduled tomorrow with the doctor who is following her for her bulging disc.  We will also schedule visit with [facility] and pharmacy for diabetic medication management.  Patient was previously on metformin but could not tolerate this medication so she was switched to for [place]. Denies f/c/s, n/v/d, hemoptysis, PND, chest pain or edema.   No Known Allergies  Immunization History  Administered Date(s) Administered   PFIZER(Purple Top)SARS-COV-2 Vaccination 07/15/2019, 08/11/2019, 04/04/2020   Tdap 12/17/2010    Past Medical History:  Diagnosis Date   Anemia    Bilateral carpal tunnel syndrome    Diabetes  mellitus without complication ([place])    Phreesia 07/15/2020   Essential hypertension    Fibroids    Uterine unspecified   Genital herpes    Heart murmur    History of migraine headaches    Seen By Dr. Venia Minks   IBS (irritable bowel syndrome)    Migraine 07/11/2014   Morbid obesity ([place])    OSA (obstructive sleep apnea)    Sleep apnea    Phreesia 07/15/2020    Tobacco History: Social History   Tobacco Use  Smoking Status Never  Smokeless Tobacco Never   Counseling given: Yes   Outpatient Encounter Medications as of 02/24/2021  Medication Sig   ondansetron (ZOFRAN ODT) 4 MG disintegrating tablet Take 1 tablet (4 mg total) by mouth every 8 (eight) hours as needed for nausea or vomiting.   predniSONE (DELTASONE) 20 MG tablet Take 1 tablet (20 mg total) by mouth daily with breakfast for 5 days.   albuterol (VENTOLIN HFA) 108 (90 Base) MCG/ACT inhaler Inhale into the lungs.   amLODipine (NORVASC) 5 MG tablet Take 1 tablet (5 mg total) by mouth daily.   Blood Glucose Monitoring Suppl (TRUE METRIX GO GLUCOSE METER) w/Device KIT USE AS DIRECTED   cetirizine (ZYRTEC) 10 MG tablet Take 1 tablet (10 mg total) by mouth daily.   Continuous Blood Gluc Receiver (DEXCOM G6 RECEIVER) DEVI 1 Device by Does not apply route 4 (four) times daily -  before meals and at  bedtime.   Continuous Blood Gluc Sensor (DEXCOM G6 SENSOR) MISC N/A   Continuous Blood Gluc Transmit (DEXCOM G6 TRANSMITTER) MISC 1 DEVICE BY DOES NOT APPLY ROUTE 4 TIMES DAILY- BEFORE MEALS AND AT BEDTIME   cyclobenzaprine (FLEXERIL) 10 MG tablet Take 1 tablet (10 mg total) by mouth 2 (two) times daily as needed for muscle spasms.   FARXIGA 5 MG TABS tablet TAKE 1 TABLET BY MOUTH EVERY DAY BEFORE BREAKFAST   glucose blood (TRUE METRIX BLOOD GLUCOSE TEST) test strip Use as instructed   ibuprofen (ADVIL) 800 MG tablet TAKE 1 TABLET BY MOUTH THREE TIMES A DAY   ketoconazole (NIZORAL) 2 % shampoo Apply to scalp, leave on 5  minutes then rinse out.   levonorgestrel (MIRENA) 20 MCG/24HR IUD 1 each by Intrauterine route once. Implanted December 2014   losartan-hydrochlorothiazide (HYZAAR) 50-12.5 MG tablet Take 1 tablet by mouth daily.   meloxicam (MOBIC) 15 MG tablet Take 15 mg by mouth daily.   Multiple Vitamin (MULTIVITAMIN WITH MINERALS) TABS tablet Take 1 tablet by mouth daily.   omeprazole (PRILOSEC) 20 MG capsule TAKE 1 CAPSULE BY MOUTH EVERY DAY   TRUEplus Lancets 28G MISC Use as directed   valACYclovir (VALTREX) 500 MG tablet TAKE 1 TABLET (500 MG) BY MOUTH TWICE DAILY FOR 5 DAYS AS NEEDED FOR OUTBREAKS   [DISCONTINUED] amLODipine (NORVASC) 5 MG tablet Take 1 tablet (5 mg total) by mouth daily.   [DISCONTINUED] diazepam (VALIUM) 5 MG tablet Take 2 tablets one hour prior to procedure. Take third tablet with you in case you need to repeat. MUST HAVE DRIVER.   [DISCONTINUED] dicyclomine (BENTYL) 20 MG tablet Take 1 tablet (20 mg total) by mouth 2 (two) times daily.   [DISCONTINUED] diltiazem (CARDIZEM CD) 120 MG 24 hr capsule Take 1 capsule (120 mg total) by mouth daily.   [DISCONTINUED] hydrOXYzine (ATARAX/VISTARIL) 50 MG tablet TAKE 1/2 -1 FULL TAB AT BEDTIME AS NEEDED FOR INSOMIA   No facility-administered encounter medications on file as of 02/24/2021.     Review of Systems  Review of Systems  Constitutional:  Positive for appetite change.  HENT: Negative.    Cardiovascular: Negative.   Gastrointestinal: Negative.   Allergic/Immunologic: Negative.   Neurological:  Positive for numbness (left side of face).  Psychiatric/Behavioral: Negative.        Physical Exam  BP 138/89   Pulse 68   Temp 98.5 F (36.9 C)   Resp 16   SpO2 96%   Wt Readings from Last 5 Encounters:  02/19/21 265 lb (120.2 kg)  12/17/20 264 lb (119.7 kg)  12/10/20 265 lb (120.2 kg)  11/21/20 273 lb 12.8 oz (124.2 kg)  07/15/20 273 lb 12.8 oz (124.2 kg)     Physical Exam Vitals and nursing note reviewed.   Constitutional:      General: She is not in acute distress.    Appearance: She is well-developed.  Cardiovascular:     Rate and Rhythm: Normal rate and regular rhythm.  Pulmonary:     Effort: Pulmonary effort is normal.     Breath sounds: Normal breath sounds.  Neurological:     Mental Status: She is alert and oriented to person, place, and time.     Lab Results:  CBC    Component Value Date/Time   WBC 5.7 02/19/2021 1750   WBC 7.8 05/05/2020 1146   RBC 5.08 02/19/2021 1750   RBC 4.68 05/05/2020 1146   HGB 13.4 02/19/2021 1750   HCT 41.7 02/19/2021  1750   PLT 390 02/19/2021 1750   MCV 82 02/19/2021 1750   MCH 26.4 (L) 02/19/2021 1750   MCH 27.1 05/05/2020 1146   MCHC 32.1 02/19/2021 1750   MCHC 32.2 05/05/2020 1146   RDW 13.2 02/19/2021 1750   LYMPHSABS 2.4 02/19/2021 1750   MONOABS 0.4 05/05/2020 1146   EOSABS 0.5 (H) 02/19/2021 1750   BASOSABS 0.1 02/19/2021 1750    BMET    Component Value Date/Time   NA 143 02/19/2021 1750   K 3.9 02/19/2021 1750   CL 105 02/19/2021 1750   CO2 24 02/19/2021 1750   GLUCOSE 80 02/19/2021 1750   GLUCOSE 133 (H) 05/05/2020 1146   BUN 9 02/19/2021 1750   CREATININE 0.91 02/19/2021 1750   CREATININE 0.69 03/09/2013 1206   CALCIUM 9.6 02/19/2021 1750   GFRNONAA >60 05/05/2020 1146   GFRAA 112 08/04/2018 1620     Assessment & Plan:   Essential hypertension Continue Hydrochlorothiazide from 25 mg daily to 50 mg daily  Adding Amlodipine for high blood pressure  Diabetes Hypoglycemia:  Blood Glucose today 88  Will schedule appointment with Lurena Joiner for diabetic medication management  Please hold diabetic medication if blood sugar is low  6 small meals per day high protein, low carb  Facial tingling:  Will order prednisone  Please go to the ED if symptoms worsen  Follow up:  Follow up with PCP in 2 weeks or sooner if needed     Fenton Foy, NP 02/24/2021

## 2021-02-24 NOTE — Patient Instructions (Addendum)
HYPERTENSION:   Continue Hydrochlorothiazide from 25 mg daily to 50 mg daily  Adding Amlodipine for high blood pressure  Diabetes Hypoglycemia:  Blood Glucose today 88  Will schedule appointment with Lurena Joiner for diabetic medication management  Please hold diabetic medication if blood sugar is low  6 small meals per day high protein, low carb  Facial tingling:  Will order prednisone  Please go to the ED if symptoms worsen  Follow up:  Follow up with PCP in 2 weeks or sooner if needed    Managing Your Hypertension Hypertension, also called high blood pressure, is when the force of the blood pressing against the walls of the arteries is too strong. Arteries are blood vessels that carry blood from your heart throughout your body. Hypertension forces the heart to work harder to pump blood and may cause the arteries to become narrow or stiff. Understanding blood pressure readings Your personal target blood pressure may vary depending on your medical conditions, your age, and other factors. A blood pressure reading includes a higher number over a lower number. Ideally, your blood pressure should be below 120/80. You should know that: The first, or top, number is called the systolic pressure. It is a measure of the pressure in your arteries as your heart beats. The second, or bottom number, is called the diastolic pressure. It is a measure of the pressure in your arteries as the heart relaxes. Blood pressure is classified into four stages. Based on your blood pressure reading, your health care provider may use the following stages to determine what type of treatment you need, if any. Systolic pressure and diastolic pressure are measured in a unit called mmHg. Normal Systolic pressure: below 623. Diastolic pressure: below 80. Elevated Systolic pressure: 762-831. Diastolic pressure: below 80. Hypertension stage 1 Systolic pressure: 517-616. Diastolic pressure: 07-37. Hypertension  stage 2 Systolic pressure: 106 or above. Diastolic pressure: 90 or above. How can this condition affect me? Managing your hypertension is an important responsibility. Over time, hypertension can damage the arteries and decrease blood flow to important parts of the body, including the brain, heart, and kidneys. Having untreated or uncontrolled hypertension can lead to: A heart attack. A stroke. A weakened blood vessel (aneurysm). Heart failure. Kidney damage. Eye damage. Metabolic syndrome. Memory and concentration problems. Vascular dementia. What actions can I take to manage this condition? Hypertension can be managed by making lifestyle changes and possibly by taking medicines. Your health care provider will help you make a plan to bring your blood pressure within a normal range. Nutrition  Eat a diet that is high in fiber and potassium, and low in salt (sodium), added sugar, and fat. An example eating plan is called the Dietary Approaches to Stop Hypertension (DASH) diet. To eat this way: Eat plenty of fresh fruits and vegetables. Try to fill one-half of your plate at each meal with fruits and vegetables. Eat whole grains, such as whole-wheat pasta, brown rice, or whole-grain bread. Fill about one-fourth of your plate with whole grains. Eat low-fat dairy products. Avoid fatty cuts of meat, processed or cured meats, and poultry with skin. Fill about one-fourth of your plate with lean proteins such as fish, chicken without skin, beans, eggs, and tofu. Avoid pre-made and processed foods. These tend to be higher in sodium, added sugar, and fat. Reduce your daily sodium intake. Most people with hypertension should eat less than 1,500 mg of sodium a day. Lifestyle  Work with your health care provider to maintain a  healthy body weight or to lose weight. Ask what an ideal weight is for you. Get at least 30 minutes of exercise that causes your heart to beat faster (aerobic exercise) most days  of the week. Activities may include walking, swimming, or biking. Include exercise to strengthen your muscles (resistance exercise), such as weight lifting, as part of your weekly exercise routine. Try to do these types of exercises for 30 minutes at least 3 days a week. Do not use any products that contain nicotine or tobacco, such as cigarettes, e-cigarettes, and chewing tobacco. If you need help quitting, ask your health care provider. Control any long-term (chronic) conditions you have, such as high cholesterol or diabetes. Identify your sources of stress and find ways to manage stress. This may include meditation, deep breathing, or making time for fun activities. Alcohol use Do not drink alcohol if: Your health care provider tells you not to drink. You are pregnant, may be pregnant, or are planning to become pregnant. If you drink alcohol: Limit how much you use to: 0-1 drink a day for women. 0-2 drinks a day for men. Be aware of how much alcohol is in your drink. In the [place] one drink equals one 12 oz bottle of beer (355 mL), one 5 oz glass of wine (148 mL), or one 1 oz glass of hard liquor (44 mL). Medicines Your health care provider may prescribe medicine if lifestyle changes are not enough to get your blood pressure under control and if: Your systolic blood pressure is 130 or higher. Your diastolic blood pressure is 80 or higher. Take medicines only as told by your health care provider. Follow the directions carefully. Blood pressure medicines must be taken as told by your health care provider. The medicine does not work as well when you skip doses. Skipping doses also puts you at risk for problems. Monitoring Before you monitor your blood pressure: Do not smoke, drink caffeinated beverages, or exercise within 30 minutes before taking a measurement. Use the bathroom and empty your bladder (urinate). Sit quietly for at least 5 minutes before taking measurements. Monitor your blood  pressure at home as told by your health care provider. To do this: Sit with your back straight and supported. Place your feet flat on the floor. Do not cross your legs. Support your arm on a flat surface, such as a table. Make sure your upper arm is at heart level. Each time you measure, take two or three readings one minute apart and record the results. You may also need to have your blood pressure checked regularly by your health care provider. General information Talk with your health care provider about your diet, exercise habits, and other lifestyle factors that may be contributing to hypertension. Review all the medicines you take with your health care provider because there may be side effects or interactions. Keep all visits as told by your health care provider. Your health care provider can help you create and adjust your plan for managing your high blood pressure. Where to find more information National Heart, Lung, and Blood Institute: https://wilson-eaton.com/ American Heart Association: www.heart.org Contact a health care provider if: You think you are having a reaction to medicines you have taken. You have repeated (recurrent) headaches. You feel dizzy. You have swelling in your ankles. You have trouble with your vision. Get help right away if: You develop a severe headache or confusion. You have unusual weakness or numbness, or you feel faint. You have severe pain in your  chest or abdomen. You vomit repeatedly. You have trouble breathing. These symptoms may represent a serious problem that is an emergency. Do not wait to see if the symptoms will go away. Get medical help right away. Call your local emergency services (911 in the [place] Do not drive yourself to the hospital. Summary Hypertension is when the force of blood pumping through your arteries is too strong. If this condition is not controlled, it may put you at risk for serious complications. Your personal target blood  pressure may vary depending on your medical conditions, your age, and other factors. For most people, a normal blood pressure is less than 120/80. Hypertension is managed by lifestyle changes, medicines, or both. Lifestyle changes to help manage hypertension include losing weight, eating a healthy, low-sodium diet, exercising more, stopping smoking, and limiting alcohol. This information is not intended to replace advice given to you by your health care provider. Make sure you discuss any questions you have with your health care provider. Document Revised: 06/09/2019 Document Reviewed: 04/04/2019 Elsevier Patient Education  2022 [place].   Preventing Hypoglycemia Hypoglycemia occurs when the level of sugar (glucose) in the blood is too low. Hypoglycemia can happen in people who do or do not have diabetes (diabetes mellitus). It can develop quickly, and it can be a medical emergency. For most people with diabetes, a blood glucose level below 70 mg/dL (3.9 mmol/L) is considered hypoglycemia. Glucose is a type of sugar that provides the body's main source of energy. Certain hormones (insulin and glucagon) control the level of glucose in the blood. Insulin lowers blood glucose, and glucagon increases blood glucose. Hypoglycemia can result from having too much insulin in the bloodstream, or from not eating enough food that contains glucose. Your risk for hypoglycemia is higher: If you take insulin or diabetes medicines to help lower your blood glucose or to help your body make more insulin. If you skip or delay a meal or snack. If you are ill. During and after exercise. You can prevent hypoglycemia by working with your health care provider to adjust your meal plan as needed and by taking other precautions. How can hypoglycemia affect me? Mild symptoms Mild hypoglycemia may not cause any symptoms. If you do have symptoms, they may include: Hunger. Sweating and feeling clammy. Dizziness or  feeling light-headed. Sleepiness or restless sleep. Nausea. Increased heart rate. Headache. Blurry vision. Mood changes, including irritability or anxiety. Tingling or numbness around the mouth, lips, or tongue. If mild hypoglycemia is not recognized and treated, it can quickly become moderate or severe hypoglycemia. Moderate symptoms Moderate hypoglycemia can cause: Confusion and poor judgment. Behavior changes. Weakness. Irregular heartbeat. A change in coordination. Severe symptoms Severe hypoglycemia is a medical emergency. It can cause: Fainting. Seizures. Loss of consciousness (coma). Death. What nutrition changes can be made? Work with your health care provider or dietitian to make a healthy meal plan that is right for you. Follow your meal plan carefully. Eat meals at regular times. If recommended by your health care provider, have snacks between meals. Donot skip or delay meals or snacks. You can be at risk for hypoglycemia if you are not getting enough carbohydrates. What lifestyle changes can be made?  Work closely with your health care provider to manage your blood glucose. Make sure you know: Your goal blood glucose levels. How and when to check your blood glucose. The symptoms of hypoglycemia. It is important to treat hypoglycemia right away to keep it from becoming severe. Do  not drink alcohol on an empty stomach. When you are ill, check your blood glucose more often than usual. Make a sick day plan in advance with your health care provider. Follow this plan whenever you cannot eat or drink normally. Always check your blood glucose before, during, and after exercise. How is this treated? This condition can often be treated by immediately eating or drinking something that contains sugar with 15 grams of fast-acting carbohydrate, such as: 4 oz (120 mL) of fruit juice. 4 oz (120 mL) of regular soda (not diet soda). Several pieces of hard candy. Check food labels to  find out how many pieces to eat for 15 grams. 1 Tbsp (15 mL) of sugar or honey. 4 glucose tablets. 1 tube of glucose gel. Treating hypoglycemia if you have diabetes If you are alert and able to swallow safely, follow the 15:15 rule: Take 15 grams of a fast-acting carbohydrate. Talk with your health care provider about how much you should take. Fast-acting options include: Glucose tablets (take 4 tablets). Several pieces of hard candy. Check food labels to find out how many pieces to eat for 15 grams. 4 oz (120 mL) of fruit juice. 4 oz (120 mL) of regular soda (not diet soda). 1 Tbsp (15 mL) of sugar or honey. 1 tube of glucose gel. Check your blood glucose 15 minutes after you take the carbohydrate. If the repeat blood glucose level is still at or below 70 mg/dL (3.9 mmol/L), take 15 grams of a carbohydrate again. If your blood glucose level does not increase above 70 mg/dL (3.9 mmol/L) after 3 tries, seek emergency medical care. After your blood glucose level returns to normal, eat a meal or a snack within 1 hour. Treating severe hypoglycemia Severe hypoglycemia is when your blood glucose level is below 54 mg/dL (3 mmol/L). Severe hypoglycemia is a medical emergency. Get medical help right away. If you have severe hypoglycemia and you cannot eat or drink, you may need glucagon. A family member or close friend should learn how to check your blood glucose and how to give you glucagon. Ask your health care provider if you need to have an emergency glucagon kit available. Severe hypoglycemia may need to be treated in a hospital. The treatment may include getting glucose through an IV. You may also need treatment for the cause of your hypoglycemia. Where to find more information American Diabetes Association: www.diabetes.[employer] of Diabetes and Digestive and Kidney Diseases: [place] Association of Diabetes Care & Education Specialists: www.diabeteseducator.org Contact  a health care provider if: You have problems keeping your blood glucose in your target range. You have frequent episodes of hypoglycemia. Get help right away if: You continue to have hypoglycemia symptoms after eating or drinking something containing glucose. Your blood glucose level is below 54 mg/dL (3 mmol/L). You faint. You have a seizure. These symptoms may represent a serious problem that is an emergency. Do not wait to see if the symptoms will go away. Get medical help right away. Call your local emergency services (911 in the [place] Do not drive yourself to the hospital. Summary Know the symptoms of hypoglycemia and when you are at risk for it, such as during exercise or when you are sick. Check your blood glucose often when you are at risk for hypoglycemia. Hypoglycemia can develop quickly, and it can be dangerous if it is not treated right away. If you have a history of severe hypoglycemia, make sure your family or a close friend knows  how to use your glucagon kit. Make sure you know how to treat hypoglycemia. Keep a fast-acting carbohydrate option available when you may be at risk for hypoglycemia. This information is not intended to replace advice given to you by your health care provider. Make sure you discuss any questions you have with your health care provider. Document Revised: 04/04/2020 Document Reviewed: 04/04/2020 Elsevier Patient Education  2022 [employer].

## 2021-02-25 ENCOUNTER — Emergency Department (HOSPITAL_BASED_OUTPATIENT_CLINIC_OR_DEPARTMENT_OTHER)
Admission: EM | Admit: 2021-02-25 | Discharge: 2021-02-25 | Disposition: A | Discharge status: Home / Self Care | Payer: BC Managed Care – PPO | Attending: Emergency Medicine | Admitting: Emergency Medicine

## 2021-02-25 ENCOUNTER — Encounter (HOSPITAL_BASED_OUTPATIENT_CLINIC_OR_DEPARTMENT_OTHER): Payer: Self-pay | Admitting: Emergency Medicine

## 2021-02-25 ENCOUNTER — Emergency Department (HOSPITAL_BASED_OUTPATIENT_CLINIC_OR_DEPARTMENT_OTHER): Payer: BC Managed Care – PPO

## 2021-02-25 ENCOUNTER — Ambulatory Visit (INDEPENDENT_AMBULATORY_CARE_PROVIDER_SITE_OTHER): Payer: BC Managed Care – PPO | Admitting: Orthopaedic Surgery

## 2021-02-25 ENCOUNTER — Ambulatory Visit (HOSPITAL_COMMUNITY)
Admission: EM | Admit: 2021-02-25 | Discharge: 2021-02-25 | Disposition: A | Discharge status: Home / Self Care | Payer: BC Managed Care – PPO

## 2021-02-25 VITALS — BP 131/84 | HR 59

## 2021-02-25 DIAGNOSIS — R42 Dizziness and giddiness: Secondary | ICD-10-CM | POA: Diagnosis not present

## 2021-02-25 DIAGNOSIS — R202 Paresthesia of skin: Secondary | ICD-10-CM | POA: Insufficient documentation

## 2021-02-25 DIAGNOSIS — E162 Hypoglycemia, unspecified: Secondary | ICD-10-CM | POA: Diagnosis not present

## 2021-02-25 DIAGNOSIS — Z5321 Procedure and treatment not carried out due to patient leaving prior to being seen by health care provider: Secondary | ICD-10-CM | POA: Insufficient documentation

## 2021-02-25 DIAGNOSIS — M5020 Other cervical disc displacement, unspecified cervical region: Secondary | ICD-10-CM

## 2021-02-25 LAB — CBC
HCT: 40.4 % (ref 36.0–46.0)
Hemoglobin: 13 g/dL (ref 12.0–15.0)
MCH: 26.7 pg (ref 26.0–34.0)
MCHC: 32.2 g/dL (ref 30.0–36.0)
MCV: 83.1 fL (ref 80.0–100.0)
Platelets: 360 10*3/uL (ref 150–400)
RBC: 4.86 MIL/uL (ref 3.87–5.11)
RDW: 13.2 % (ref 11.5–15.5)
WBC: 5.8 10*3/uL (ref 4.0–10.5)
nRBC: 0 % (ref 0.0–0.2)

## 2021-02-25 LAB — URINALYSIS, ROUTINE W REFLEX MICROSCOPIC
Bilirubin Urine: NEGATIVE
Glucose, UA: >1000 mg/dL — AB
Hgb urine dipstick: NEGATIVE
Ketones, ur: NEGATIVE mg/dL
Leukocytes,Ua: NEGATIVE
Nitrite: NEGATIVE
Protein, ur: NEGATIVE mg/dL
Specific Gravity, Urine: 1.016 (ref 1.005–1.030)
pH: 5.5 (ref 5.0–8.0)

## 2021-02-25 LAB — COMPREHENSIVE METABOLIC PANEL
ALT: 13 U/L (ref 0–44)
AST: 12 U/L — ABNORMAL LOW (ref 15–41)
Albumin: 4 g/dL (ref 3.5–5.0)
Alkaline Phosphatase: 66 U/L (ref 38–126)
Anion gap: 9 (ref 5–15)
BUN: 13 mg/dL (ref 6–20)
CO2: 29 mmol/L (ref 22–32)
Calcium: 9.9 mg/dL (ref 8.9–10.3)
Chloride: 103 mmol/L (ref 98–111)
Creatinine, Ser: 0.82 mg/dL (ref 0.44–1.00)
GFR, Estimated: >60 mL/min (ref >60)
Glucose, Bld: 127 mg/dL — ABNORMAL HIGH (ref 70–99)
Potassium: 3.7 mmol/L (ref 3.5–5.1)
Sodium: 141 mmol/L (ref 135–145)
Total Bilirubin: 0.6 mg/dL (ref 0.3–1.2)
Total Protein: 8 g/dL (ref 6.5–8.1)

## 2021-02-25 LAB — GLUCOSE, POCT (MANUAL RESULT ENTRY): POC Glucose: 88 mg/dl (ref 70–99)

## 2021-02-25 IMAGING — CT CT HEAD W/O CM
4 series · 16 of 47 positions shown, 18 images · non-contrast
Comparison: Head CT [DATE]

CLINICAL DATA: Dizziness and left-sided facial tingling.

EXAM:
CT HEAD WITHOUT CONTRAST
TECHNIQUE: Contiguous axial images were obtained from the base of the skull
through the vertex without intravenous contrast.

[Series 2: head wo · axial · 0.46mm/px · z∈[+1114,+1224]mm · 7 of 30 slices shown, 9 images]
[im 4/30  brain]
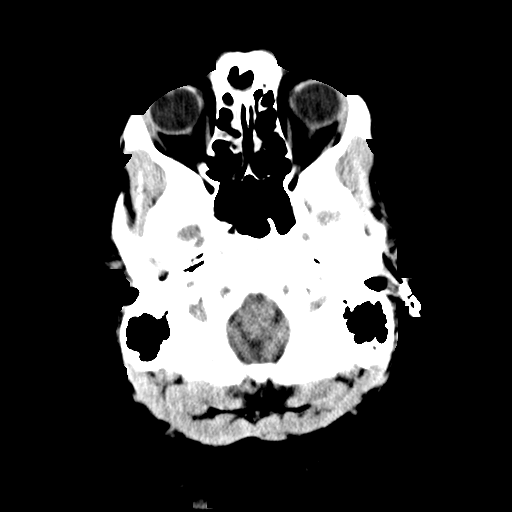
[im 4/30  bone]
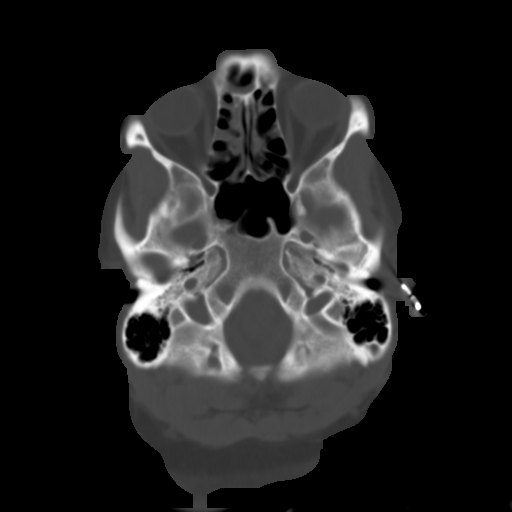
[im 8/30  brain]
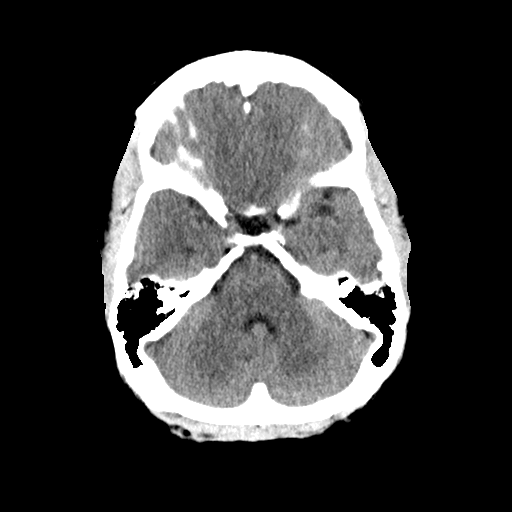
[im 11/30  brain]
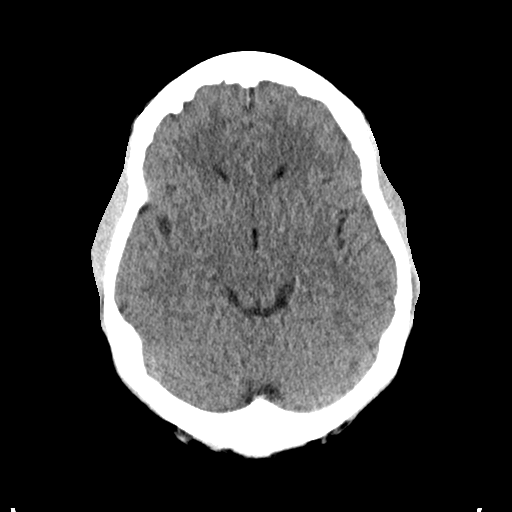
[im 15/30  brain]
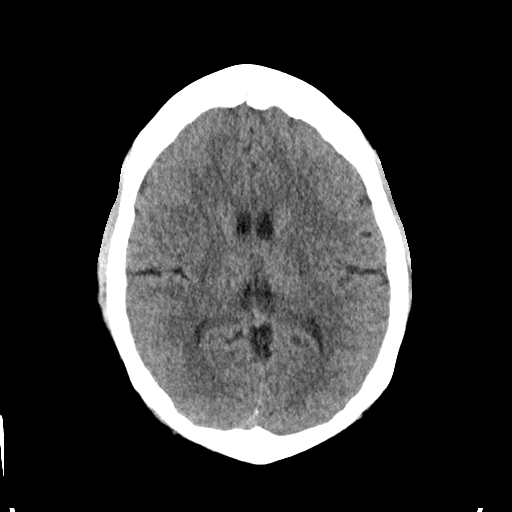
[im 19/30  brain]
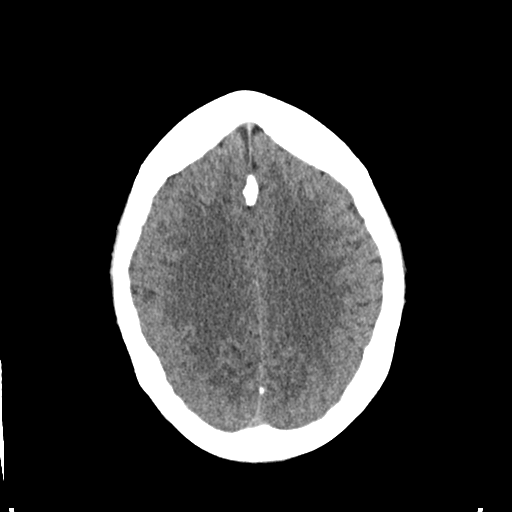
[im 19/30  bone]
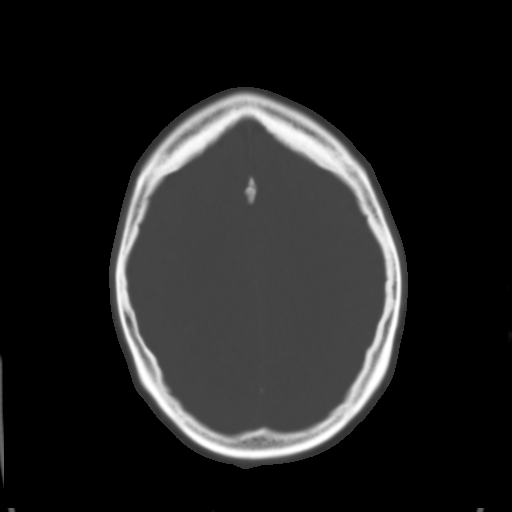
[im 22/30  brain]
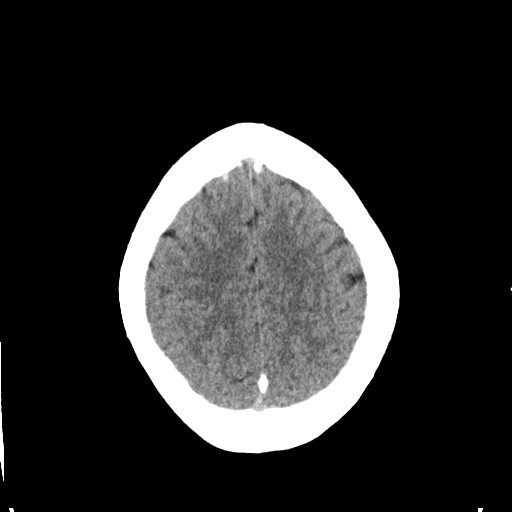
[im 26/30  brain]
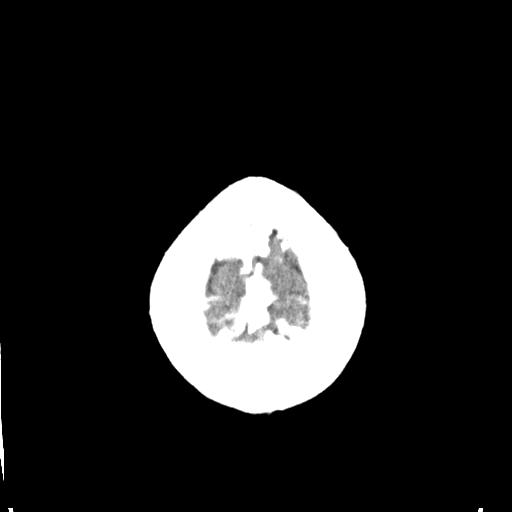

[Series 3: head bone · axial · 0.46mm/px · z∈[+1113,+1141]mm · 3 of 74 slices shown]
[im 8/74  bone]
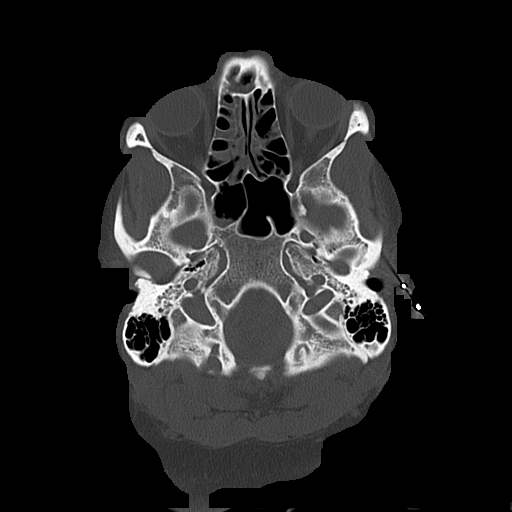
[im 15/74  bone]
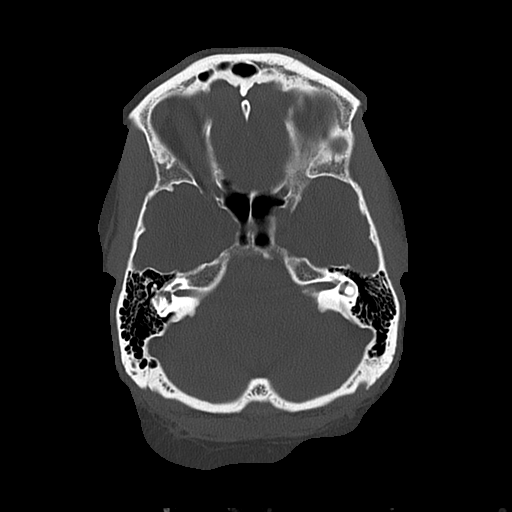
[im 22/74  bone]
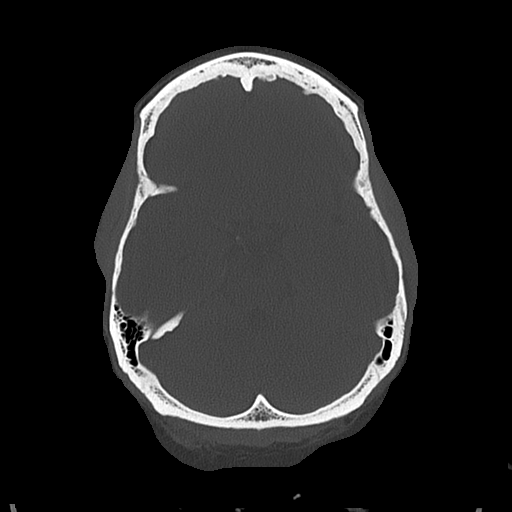

[Series 4: coronal soft · coronal · 0.32mm/px · 3 of 71 slices shown]
[im 24/71  brain]
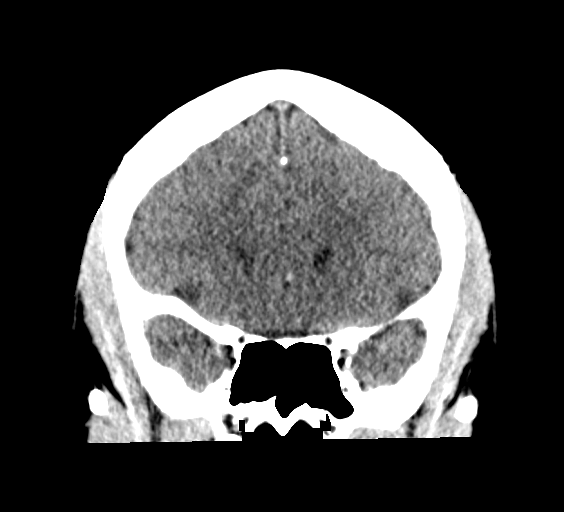
[im 32/71  brain]
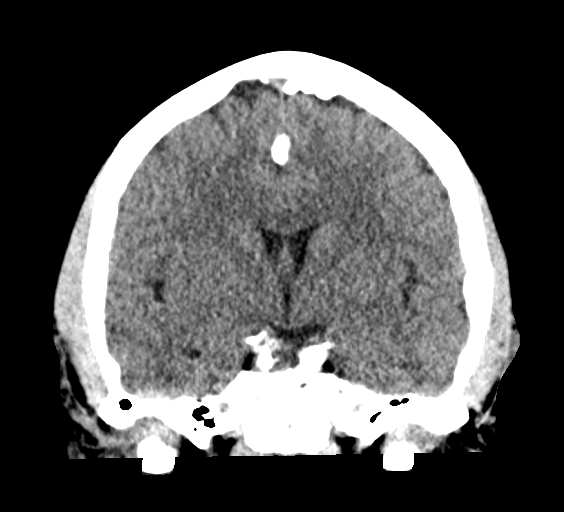
[im 39/71  brain]
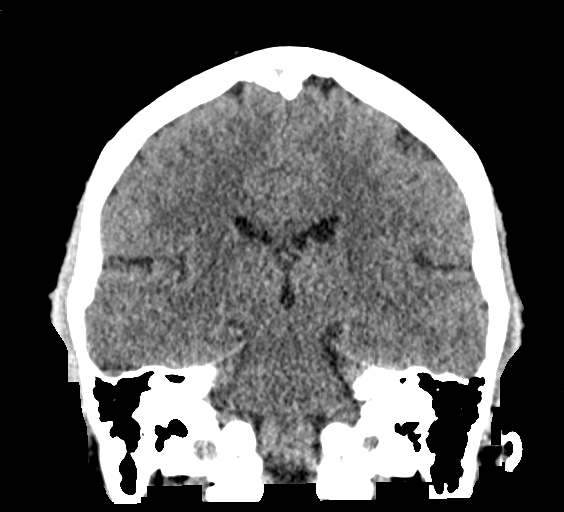

[Series 5: sagittal soft · sagittal · 0.32mm/px · 3 of 62 slices shown]
[im 21/62  brain]
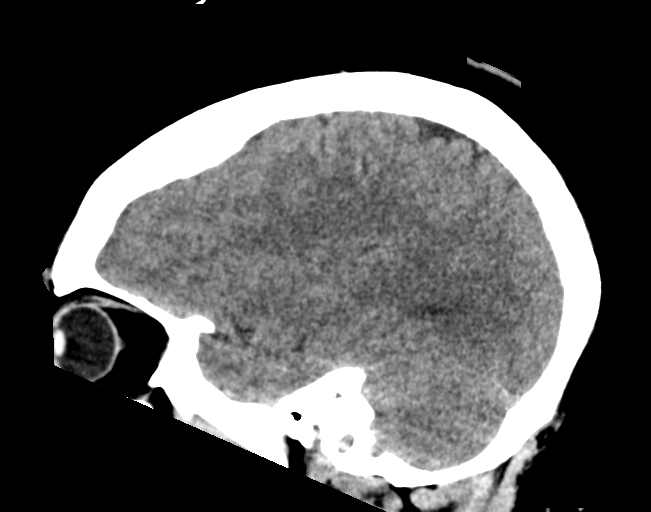
[im 31/62  brain]
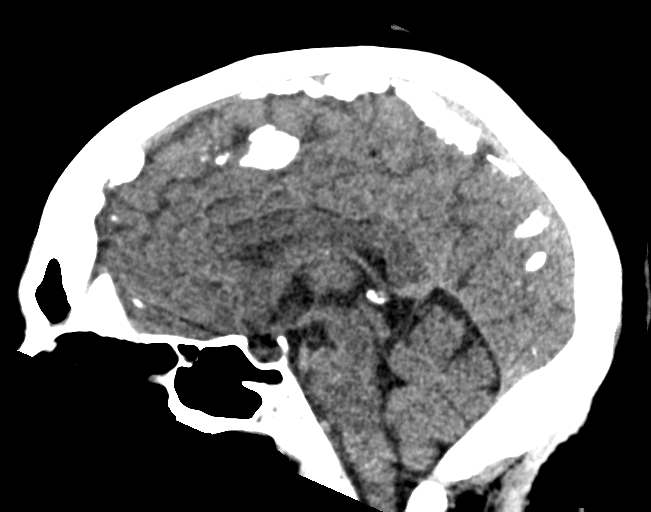
[im 41/62  brain]
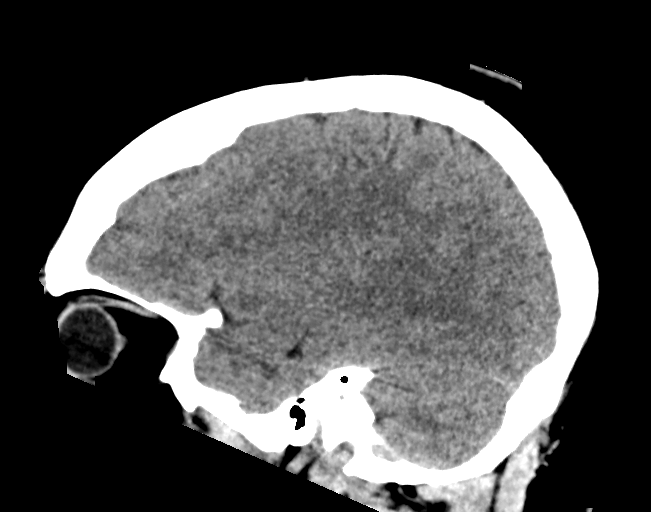

[16 of 47 positions shown; findings below may reference images not displayed]

FINDINGS: Brain: The ventricles are normal in size and configuration. No
extra-axial fluid collections are identified. The gray-white
differentiation is maintained. No CT findings for acute hemispheric
infarction or intracranial hemorrhage. No mass lesions. The
brainstem and cerebellum are normal.

Vascular: No significant vascular calcifications, aneurysm or
hyperdense vessels.

Skull: No skull fracture or bone lesions. Stable moderate
hyperostosis frontalis interna.

Sinuses/Orbits: Scattered ethmoid and right half sphenoid sinus
disease. The mastoid air cells and middle ear cavities are clear.
The globes are intact.

Other: No scalp lesions or scalp hematoma.
IMPRESSION: No acute intracranial findings or mass lesions.

## 2021-02-25 NOTE — ED Triage Notes (Signed)
Pt arrives to ED with c/o of left sided facial tingling and dizziness. The facial tingling started yesterday and the dizziness has been x1 week. Her current blood sugar is 149. She was seen by UC and her PCP yesterday for same. No CP or SOB currently.

## 2021-02-25 NOTE — Discharge Instructions (Addendum)
GO TO ER  Needs further evaluation of symptoms

## 2021-02-25 NOTE — ED Notes (Signed)
Patient is being discharged from the Urgent Care and sent to the Emergency Department via POV . Per Morley Kos NP, patient is in need of higher level of care due to left side numbness, right side mouth twitching, dizziness, and nausea. . Patient is aware and verbalizes understanding of plan of care.  Vitals:   02/25/21 0942  BP: (!) 146/85  Pulse: 62  Resp: 17  SpO2: 100%
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
## 2021-02-25 NOTE — ED Triage Notes (Signed)
Pt is present today with left side numbness, dizziness, SOB, left side facial numbness, left side arm numbness, twitching the right side of the mouth. Pt states sx yesterday

## 2021-02-28 ENCOUNTER — Telehealth: Payer: Self-pay | Admitting: Family

## 2021-02-28 NOTE — Telephone Encounter (Signed)
Patient stated she had a CT done on 02/25/2021 and was told the report would be sent to her primary care doctor. Patient is waiting for medication to be called in at the [facility] on [address]. Patient stating she is still having sinus pain, facial numbness and headaches.

## 2021-03-04 ENCOUNTER — Other Ambulatory Visit: Payer: Self-pay

## 2021-03-04 ENCOUNTER — Encounter: Payer: Self-pay | Admitting: Pharmacist

## 2021-03-04 ENCOUNTER — Ambulatory Visit: Payer: BC Managed Care – PPO | Attending: Family | Admitting: Pharmacist

## 2021-03-04 DIAGNOSIS — E11649 Type 2 diabetes mellitus with hypoglycemia without coma: Secondary | ICD-10-CM | POA: Diagnosis not present

## 2021-03-04 NOTE — Telephone Encounter (Signed)
Called pt stated she finished all prescribed meds and still experiencing same symptoms. Stated would like to be reevaluated by her doctor and not spend hours in the [facility]. Communicated pt NP Amy below message and stated being unhappy with the care provided, and sche cannot keep going to the [facility]. Please advice . Thank you

## 2021-03-04 NOTE — Telephone Encounter (Signed)
Please offer patient appointment with me.

## 2021-03-04 NOTE — Telephone Encounter (Signed)
Last visit 02/24/2021 with Lazaro Arms, NP for current concerns and prescribed Prednisone and Ondansetron at that time.  CT head completed at [facility] Emergency Department on 02/25/2021 without acute findings or mass lesions.  Return to Emergency Department if symptoms worsen or new problems develop. Please note patient seen at [facility] Emergency Department on 02/25/2021 and left without being seen after triage.

## 2021-03-04 NOTE — Progress Notes (Signed)
    S:    PCP: Durene Fruits  No chief complaint on file.  Patient arrives in good spirits.  Presents for diabetes evaluation, education, and management. Patient was referred and last seen by Primary Care Provider on 02/24/2021.   Patient reports Diabetes was diagnosed this year. She was initially started on metformin but could not tolerate this d/t diarrhea. She was then changed to [place]. She was a longstanding pre-diabetic patient until her A1c peaked at 7.5 January of this year. Most recently, her A1c was resulted at 6.2. She endorses occasional hypoglycemic episodes summarized below.   Family/Social History:  -Fhx: HTN, HLD, DM, CHF, MI, stroke -Tobacco: never smoker  -Alcohol: denies use   Insurance coverage/medication affordability: BCBS  Medication adherence reported.   Current diabetes medications include: Farxiga 10 mg daily   Patient reports hypoglycemic events. Recalls two events when home CBG was <70. She treated successfully and can attribute this to prolonged times between meals.   Patient reported dietary habits:  - She has several questions regarding diet today  - Of note, she usually eats cereal for BF (8a) and will not eat another meal until break at 1p.  Patient-reported exercise habits:  - None reported    Patient denies nocturia (nighttime urination).  Patient denies neuropathy (nerve pain). Patient denies visual changes. Patient reports self foot exams.     O:  Physical Exam  ROS  Lab Results  Component Value Date   HGBA1C 6.2 (H) 02/19/2021   There were no vitals filed for this visit.  Lipid Panel     Component Value Date/Time   CHOL 137 07/15/2020 0946   TRIG 59 07/15/2020 0946   HDL 43 07/15/2020 0946   CHOLHDL 3.2 07/15/2020 0946   LDLCALC 82 07/15/2020 0946    Clinical Atherosclerotic Cardiovascular Disease (ASCVD): No  The 10-year ASCVD risk score (Arnett DK, et al., 2019) is: 5.3%   Values used to calculate the score:     Age:  40 years     Sex: Female     Is Non-Hispanic African American: Yes     Diabetic: Yes     Tobacco smoker: No     Systolic Blood Pressure: 983 mmHg     Is BP treated: Yes     HDL Cholesterol: 43 mg/dL     Total Cholesterol: 137 mg/dL   A/P: Diabetes dx this year currently under good control. Patient is able to verbalize appropriate hypoglycemia management plan. Medication adherence appears appropriate.  -Continued Farxiga 10 mg daily for now.  -Advised pt to eat small, frequent meals as able throughout the day (high protein, low carb).  -Advised her to drink a Glucerna shake between her breakfast and lunch break at work.  -[employer] materials discussed with patient.  -Advised pt to eat a high protein snack before bedtime.  -Extensively discussed pathophysiology of diabetes, recommended lifestyle interventions, dietary effects on blood sugar control -Counseled on s/sx of and management of hypoglycemia -Next A1C anticipated 05/2021.   Written patient instructions provided. Total time in face to face counseling 30 minutes.   Follow up Pharmacist Clinic Visit in 1 month.  Can discuss d/c Farxiga if hypoglycemia persists despite lifestyle changes.   Benard Halsted, PharmD, [place], [place] [phone]

## 2021-03-09 NOTE — Progress Notes (Signed)
Erroneous encounter

## 2021-03-10 ENCOUNTER — Other Ambulatory Visit: Payer: Self-pay

## 2021-03-14 ENCOUNTER — Encounter: Payer: BC Managed Care – PPO | Admitting: Family

## 2021-03-14 DIAGNOSIS — I10 Essential (primary) hypertension: Secondary | ICD-10-CM

## 2021-03-14 DIAGNOSIS — E11649 Type 2 diabetes mellitus with hypoglycemia without coma: Secondary | ICD-10-CM

## 2021-03-15 ENCOUNTER — Other Ambulatory Visit: Payer: Self-pay | Admitting: Family

## 2021-03-15 DIAGNOSIS — E119 Type 2 diabetes mellitus without complications: Secondary | ICD-10-CM

## 2021-03-17 ENCOUNTER — Telehealth: Payer: Self-pay | Admitting: *Deleted

## 2021-03-17 NOTE — Telephone Encounter (Addendum)
Called patient to schedule appointment for medical clearance. Pat Patient states she will call office back to schedule an appointment.    Pls schedule appt when she calls back.    Forms are located in folder on Eboney's desk.

## 2021-03-24 ENCOUNTER — Other Ambulatory Visit: Payer: Self-pay | Admitting: Obstetrics

## 2021-03-24 DIAGNOSIS — A6000 Herpesviral infection of urogenital system, unspecified: Secondary | ICD-10-CM

## 2021-03-24 NOTE — Progress Notes (Signed)
Erroneous encounter

## 2021-03-27 ENCOUNTER — Ambulatory Visit: Payer: BC Managed Care – PPO | Admitting: Surgery

## 2021-03-28 ENCOUNTER — Encounter: Payer: BC Managed Care – PPO | Admitting: Family

## 2021-03-28 DIAGNOSIS — Z01818 Encounter for other preprocedural examination: Secondary | ICD-10-CM

## 2021-04-03 ENCOUNTER — Ambulatory Visit (INDEPENDENT_AMBULATORY_CARE_PROVIDER_SITE_OTHER): Payer: BC Managed Care – PPO | Admitting: Surgery

## 2021-04-03 ENCOUNTER — Other Ambulatory Visit: Payer: Self-pay

## 2021-04-03 ENCOUNTER — Encounter: Payer: Self-pay | Admitting: Surgery

## 2021-04-03 VITALS — BP 127/80 | HR 69 | Temp 98.0°F

## 2021-04-03 DIAGNOSIS — M5020 Other cervical disc displacement, unspecified cervical region: Secondary | ICD-10-CM

## 2021-04-03 NOTE — Progress Notes (Signed)
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  States that symptoms unchanged from previous visit and she is wanting to proceed with C5-6 and C6-7 ACDF.  Today history and physical performed.  Review of systems negative.  Surgical procedure discussed along with potential rehab/recovery time.  All questions answered.

## 2021-04-07 ENCOUNTER — Ambulatory Visit: Payer: BC Managed Care – PPO | Admitting: Pharmacist

## 2021-04-07 ENCOUNTER — Telehealth: Payer: Self-pay | Admitting: Orthopaedic Surgery

## 2021-04-07 NOTE — Telephone Encounter (Signed)
Received medical records release form,$50.00 cash and FMLA/Disability paperwork from patient     Forwarding to [facility] today

## 2021-04-08 NOTE — Progress Notes (Signed)
Surgical Instructions    Your procedure is scheduled on Wednesday, November 30th.  Report to [facility] Main Entrance "A" at 10:30 A.M., then check in with the Admitting office.  Call this number if you have problems the morning of surgery:  [phone]   If you have any questions prior to your surgery date call [phone]: Open Monday-Friday 8am-4pm    Remember:  Do not eat after midnight the night before your surgery  You may drink clear liquids until 9:30 AM the morning of your surgery.   Clear liquids allowed are: Water, Non-Citrus Juices (without pulp), Carbonated Beverages, Clear Tea, Black Coffee ONLY (NO MILK, CREAM OR POWDERED CREAMER of any kind), and Gatorade    Take these medicines the morning of surgery with A SIP OF WATER Amlodipine (Norvasc)  If needed: Albuterol inhaler (bring with you on day of surgery) Zyrtec Cyclobenzaprine (Flexeril) Zofran   As of today, STOP taking any Aspirin (unless otherwise instructed by your surgeon) Aleve, Naproxen, Ibuprofen, Motrin, Advil, Goody's, BC's, all herbal medications, fish oil, and all vitamins.  WHAT DO I DO ABOUT MY DIABETES MEDICATION?   Do not take oral diabetes medicines (pills) the morning of surgery - [place], do NOT take [place] AND AFTER SURGERY  Why is it important to control my blood sugar before and after surgery? Improving blood sugar levels before and after surgery helps healing and can limit problems. A way of improving blood sugar control is eating a healthy diet by:  Eating less sugar and carbohydrates  Increasing activity/exercise  Talking with your doctor about reaching your blood sugar goals High blood sugars (greater than 180 mg/dL) can raise your risk of infections and slow your recovery, so you will need to focus on controlling your diabetes during the weeks before surgery. Make sure that the doctor who takes care of your  diabetes knows about your planned surgery including the date and location.  How do I manage my blood sugar before surgery? Check your blood sugar at least 4 times a day, starting 2 days before surgery, to make sure that the level is not too high or low.  Check your blood sugar the morning of your surgery when you wake up and every 2 hours until you get to the Short Stay unit.  If your blood sugar is less than 70 mg/dL, you will need to treat for low blood sugar: Do not take insulin. Treat a low blood sugar (less than 70 mg/dL) with  cup of clear juice (cranberry or apple), 4 glucose tablets, OR glucose gel. Recheck blood sugar in 15 minutes after treatment (to make sure it is greater than 70 mg/dL). If your blood sugar is not greater than 70 mg/dL on recheck, call [phone] for further instructions. Report your blood sugar to the short stay nurse when you get to Short Stay.  If you are admitted to the hospital after surgery: Your blood sugar will be checked by the staff and you will probably be given insulin after surgery (instead of oral diabetes medicines) to make sure you have good blood sugar levels. The goal for blood sugar control after surgery is 80-180 mg/dL.    After your COVID test   You are not required to quarantine however you are required to wear a well-fitting mask when you are out and around people not in your household.  If your mask becomes wet or  soiled, replace with a new one.  Wash your hands often with soap and water for 20 seconds or clean your hands with an alcohol-based hand sanitizer that contains at least 60% alcohol.  Do not share personal items.  Notify your provider: if you are in close contact with someone who has COVID  or if you develop a fever of 100.4 or greater, sneezing, cough, sore throat, shortness of breath or body aches.                DAY OF SURGERY: Do not wear jewelry or makeup Do not wear lotions, powders, perfumes, or deodorant. Do  not shave 48 hours prior to surgery.   Do not bring valuables to the hospital. DO Not wear nail polish, gel polish, artificial nails, or any other type of covering on natural nails including finger and toenails. If patients have artificial nails, gel coating, etc. that need to be removed by a nail salon, please have this removed prior to surgery or surgery may need to be canceled/delayed if the surgeon/ anesthesia feels like the patient is unable to be adequately monitored.             [facility] is not responsible for any belongings or valuables.  Do NOT Smoke (Tobacco/Vaping)  24 hours prior to your procedure  If you use a CPAP at night, you may bring your mask for your overnight stay.   Contacts, glasses, hearing aids, dentures or partials may not be worn into surgery, please bring cases for these belongings   For patients admitted to the hospital, discharge time will be determined by your treatment team.   Patients discharged the day of surgery will not be allowed to drive home, and someone needs to stay with them for 24 hours.  NO VISITORS WILL BE ALLOWED IN PRE-OP WHERE PATIENTS ARE PREPPED FOR SURGERY.  ONLY 1 SUPPORT PERSON MAY BE PRESENT IN THE WAITING ROOM WHILE YOU ARE IN SURGERY.  IF YOU ARE TO BE ADMITTED, ONCE YOU ARE IN YOUR ROOM YOU WILL BE ALLOWED TWO (2) VISITORS. 1 (ONE) VISITOR MAY STAY OVERNIGHT BUT MUST ARRIVE TO THE ROOM BY 8pm.  Minor children may have two parents present. Special consideration for safety and communication needs will be reviewed on a case by case basis.  Special instructions:    Oral Hygiene is also important to reduce your risk of infection.  Remember - BRUSH YOUR TEETH THE MORNING OF SURGERY WITH YOUR REGULAR TOOTHPASTE   [place]- Preparing For Surgery  Before surgery, you can play an important role. Because skin is not sterile, your skin needs to be as free of germs as possible. You can reduce the number of germs on your skin by washing with  CHG (chlorahexidine gluconate) Soap before surgery.  CHG is an antiseptic cleaner which kills germs and bonds with the skin to continue killing germs even after washing.     Please do not use if you have an allergy to CHG or antibacterial soaps. If your skin becomes reddened/irritated stop using the CHG.  Do not shave (including legs and underarms) for at least 48 hours prior to first CHG shower. It is OK to shave your face.  Please follow these instructions carefully.     Shower the NIGHT BEFORE SURGERY and the MORNING OF SURGERY with CHG Soap.   If you chose to wash your hair, wash your hair first as usual with your normal shampoo. After you shampoo, rinse your hair and  body thoroughly to remove the shampoo.  Then [employer] and genitals (private parts) with your normal soap and rinse thoroughly to remove soap.  After that Use CHG Soap as you would any other liquid soap. You can apply CHG directly to the skin and wash gently with a scrungie or a clean washcloth.   Apply the CHG Soap to your body ONLY FROM THE NECK DOWN.  Do not use on open wounds or open sores. Avoid contact with your eyes, ears, mouth and genitals (private parts). Wash Face and genitals (private parts)  with your normal soap.   Wash thoroughly, paying special attention to the area where your surgery will be performed.  Thoroughly rinse your body with warm water from the neck down.  DO NOT shower/wash with your normal soap after using and rinsing off the CHG Soap.  Pat yourself dry with a CLEAN TOWEL.  Wear CLEAN PAJAMAS to bed the night before surgery  Place CLEAN SHEETS on your bed the night before your surgery  DO NOT SLEEP WITH PETS.   Day of Surgery:  Take a shower with CHG soap. Wear Clean/Comfortable clothing the morning of surgery Do not apply any deodorants/lotions.   Remember to brush your teeth WITH YOUR REGULAR TOOTHPASTE.   Please read over the following fact sheets that you were given.

## 2021-04-09 ENCOUNTER — Encounter (HOSPITAL_COMMUNITY)
Admission: RE | Admit: 2021-04-09 | Discharge: 2021-04-09 | Disposition: A | Discharge status: Home / Self Care | Payer: BC Managed Care – PPO | Source: Ambulatory Visit | Attending: Orthopaedic Surgery | Admitting: Orthopaedic Surgery

## 2021-04-09 ENCOUNTER — Other Ambulatory Visit: Payer: Self-pay

## 2021-04-09 ENCOUNTER — Encounter (HOSPITAL_COMMUNITY): Payer: Self-pay

## 2021-04-09 DIAGNOSIS — Z01812 Encounter for preprocedural laboratory examination: Secondary | ICD-10-CM | POA: Insufficient documentation

## 2021-04-09 DIAGNOSIS — E119 Type 2 diabetes mellitus without complications: Secondary | ICD-10-CM | POA: Insufficient documentation

## 2021-04-09 DIAGNOSIS — M50223 Other cervical disc displacement at C6-C7 level: Secondary | ICD-10-CM | POA: Insufficient documentation

## 2021-04-09 DIAGNOSIS — Z6841 Body Mass Index (BMI) 40.0 and over, adult: Secondary | ICD-10-CM | POA: Insufficient documentation

## 2021-04-09 DIAGNOSIS — D649 Anemia, unspecified: Secondary | ICD-10-CM | POA: Diagnosis not present

## 2021-04-09 DIAGNOSIS — M50222 Other cervical disc displacement at C5-C6 level: Secondary | ICD-10-CM | POA: Insufficient documentation

## 2021-04-09 DIAGNOSIS — I10 Essential (primary) hypertension: Secondary | ICD-10-CM | POA: Diagnosis not present

## 2021-04-09 DIAGNOSIS — K589 Irritable bowel syndrome without diarrhea: Secondary | ICD-10-CM | POA: Insufficient documentation

## 2021-04-09 DIAGNOSIS — Z01818 Encounter for other preprocedural examination: Secondary | ICD-10-CM

## 2021-04-09 DIAGNOSIS — G4733 Obstructive sleep apnea (adult) (pediatric): Secondary | ICD-10-CM | POA: Insufficient documentation

## 2021-04-09 LAB — SURGICAL PCR SCREEN
MRSA, PCR: NEGATIVE
Staphylococcus aureus: NEGATIVE

## 2021-04-09 LAB — GLUCOSE, CAPILLARY: Glucose-Capillary: 129 mg/dL — ABNORMAL HIGH (ref 70–99)

## 2021-04-09 NOTE — Progress Notes (Signed)
PCP - Durene Fruits, NP Cardiologist - pt denies  PPM/ICD - n/a  Chest x-ray - n/a EKG - 02/19/21 Stress Test - pt denies ECHO - pt denies Cardiac Cath - pt denies  Sleep Study - yes, has Sleep Apnea, does not wear CPAP  Fasting Blood Sugar - 102-104 (129 in PAT) Pt wears CBGM on RUQ  Blood Thinner Instructions: n/a Aspirin Instructions: As of today, STOP taking any Meloxicam, Aspirin (unless otherwise instructed by your surgeon) Aleve, Naproxen, Ibuprofen, Motrin, Advil, Goody's, BC's, all herbal medications, fish oil, and all vitamins.  ERAS Protcol - yes PRE-SURGERY Ensure or G2- G2  COVID TEST- scheduled for 11/28 at 1310  Anesthesia review: yes, heart murmur w/no previous ECHO  Patient denies shortness of breath, fever, cough and chest pain at PAT appointment   All instructions explained to the patient, with a verbal understanding of the material. Patient agrees to go over the instructions while at home for a better understanding. Patient also instructed to self quarantine after being tested for COVID-19. The opportunity to ask questions was provided.

## 2021-04-11 NOTE — Anesthesia Preprocedure Evaluation (Addendum)
Anesthesia Evaluation  Patient identified by MRN, date of birth, ID band Patient awake    Reviewed: Allergy & Precautions, NPO status , Patient's Chart, lab work & pertinent test results  Airway Mallampati: III  TM Distance: >3 FB Neck ROM: Full    Dental  (+) Dental Advisory Given, Teeth Intact, Chipped,    Pulmonary sleep apnea ,    Pulmonary exam normal breath sounds clear to auscultation       Cardiovascular hypertension, Pt. on medications  Rhythm:Regular Rate:Normal     Neuro/Psych  Headaches,    GI/Hepatic negative GI ROS, Neg liver ROS,   Endo/Other  diabetesMorbid obesity  Renal/GU negative Renal ROS     Musculoskeletal negative musculoskeletal ROS (+)   Abdominal (+) + obese,   Peds  Hematology  (+) Blood dyscrasia, anemia ,   Anesthesia Other Findings   Reproductive/Obstetrics negative OB ROS                          Anesthesia Physical Anesthesia Plan  ASA: 3  Anesthesia Plan: General   Post-op Pain Management: Ketamine IV, Tylenol PO (pre-op) and Dilaudid IV   Induction: Intravenous  PONV Risk Score and Plan: 4 or greater and Ondansetron, Dexamethasone, Treatment may vary due to age or medical condition, Midazolam and Diphenhydramine  Airway Management Planned: Oral ETT  Additional Equipment: None  Intra-op Plan:   Post-operative Plan: Extubation in OR  Informed Consent: I have reviewed the patients History and Physical, chart, labs and discussed the procedure including the risks, benefits and alternatives for the proposed anesthesia with the patient or authorized representative who has indicated his/her understanding and acceptance.     Dental advisory given  Plan Discussed with: CRNA  Anesthesia Plan Comments: (Pt with wireless glucose monitor on RUQ. She does not want to remove it because she does not have a replacement cartridge. I explained to her that I  cannot guarantee that the electrocautery used in the OR will not damage the unit. She expressed understanding and requests that it not be removed.   PAT note written by Myra Gianotti, PA-C.   She had CMP, Lipid Profile, TSH, Vit. D, B12, A1c, and CBC with differential done on 03/25/21 at [facility] that can be viewed in [place]. Results include: Na 140, K 4.1, CO2 31, BUN 12, Glucose 90, Creatinine 0.83, Ca 9.2, total protein 7.5, Albumin 3.9, AST 11, ALT 14, total bilirubin 0.7, TSH 0.97, A1c 6.2%, WBC 5.2, H/H 12.5/37.8, PLT 343. )      Anesthesia Quick Evaluation

## 2021-04-11 NOTE — Progress Notes (Addendum)
Anesthesia Chart Review:  Case: [id number] Date/Time: 04/16/21 1215   Procedure: ANTERIOR CERVICAL DISCECTOMY FUSION C5-6, C6-7, ALLOGRAFT, PLATE   Anesthesia type: General   Pre-op diagnosis: C5-6, C6-7 cervical disc protrusion, posterior longitudinal ligament calcification   Location: [facility] OR ROOM 03 / [place] OR   Surgeons: Marybelle Killings, MD       DISCUSSION: Patient is a 40 year old [name] scheduled for the above procedure.  History includes never smoker, HTN, DM2 (diagnosed 05/2020), murmur, OSA (does not wear CPAP), fibroids, anemia, IBS, migraines (complex migraine 05/05/20 with left sided numbness with negative MRI/MRA head), morbid obesity.  History of murmur noted in records dating back to 10/2008; however, several notes reviewed since then and of the ones I reviewed did not see physical exam findings documenting ausculation of a murmur. At her annual visit exam by Carmen Herter, NP on 2/28/2, she documented "normal heart sounds", and at her new patient visit on 03/25/21, Carmen Bruins, NP documented "No murmur heard.". This anesthesia APP was not asked to evaluated patient during her PAT visit, so notes suggest at least recently, no murmur has been heard. Patient denied prior echocardiogram. Last EKG available in [facility] on 02/19/21 showed SR, incomplete RBBB, and non-specific T wave abnormality which appears to have been present since at least 2018. Awaiting tracing, but per Narrative her 03/25/21 done at her preoperative medical evaluation with Carmen Katz, NP showed unusual P axis, possible ectopic atrial bradycardia.   She had CMP, Lipid Profile, TSH, Vit. D, B12, A1c, and CBC with differential done on 03/25/21 at [facility] that can be viewed in [place]. Results include: Na 140, K 4.1, CO2 31, BUN 12, Glucose 90, Creatinine 0.83, Ca 9.2, total protein 7.5, Albumin 3.9, AST 11, ALT 14, total bilirubin 0.7, TSH 0.97, A1c 6.2%, WBC 5.2, H/H 12.5/37.8, PLT 343.   Presurgical COVID-19  test is scheduled for 04/14/21. 03/25/21 EKG tracing and medical clearance records requested from PCP.   ADDENDUM 04/15/21 10:55 AM: Medical clearance note and 03/25/21 EKG received from [place], [place]. 04/14/21 COVID-19 test negative. Anesthesia team to evaluate on the day of surgery.    VS: BP 138/75   Pulse (!) 58   Temp 36.6 C (Oral)   Resp 17   Ht _0  (1.676 m)   Wt 120 kg   SpO2 100%   BMI 42.71 kg/m    PROVIDERS: Carmen Herter, NP is listed as PCP ([facility]), established 06/10/20, but it appears that she was seen on 03/25/21 by Carmen Bruins, FNP at [facility] for preoperative visit and to get established with primary care. Referred to ENT for ethmoid and sphenoid sinus disease seen on CT for facial numbness.     LABS: She had labs on 03/25/21 that can be viewed in [place]. See DISCUSSION. ( A1c 11/8/2 6.2%.  (all labs ordered are listed, but only abnormal results are displayed)  Labs Reviewed  GLUCOSE, CAPILLARY - Abnormal; Notable for the following components:      Result Value   Glucose-Capillary 129 (*)    All other components within normal limits  SURGICAL PCR SCREEN     IMAGES: CT Heat 02/25/21: FINDINGS: - Brain: The ventricles are normal in size and configuration. No extra-axial fluid collections are identified. The gray-white differentiation is maintained. No CT findings for acute hemispheric infarction or intracranial hemorrhage. No mass lesions. The brainstem and cerebellum are normal. - Vascular: No significant  vascular calcifications, aneurysm or hyperdense vessels. - Skull: No skull fracture or bone lesions. Stable moderate hyperostosis frontalis interna. - Sinuses/Orbits: Scattered ethmoid and right half sphenoid sinus disease. The mastoid air cells and middle ear cavities are clear. The globes are intact. - Other: No scalp lesions or scalp hematoma. IMPRESSION: No acute intracranial  findings or mass lesions.   MRI L-spine 12/04/20: IMPRESSION: 1. Transitional lumbosacral anatomy. For the purposes of this dictation there are small, hypoplastic ribs at L1 and the conus terminates at L2. 2. Mild to moderate foraminal stenosis on the left at L4-L5 and L5-S1. Mild right foraminal stenosis at L5-S1. 3. No significant canal stenosis.   MRI C-spine 12/04/20: IMPRESSION: Motion limited study. 1. Posterior disc protrusions contact and flatten ventral cord at C5-C6 and C6-C7 without significant canal stenosis. 2. No significant foraminal stenosis.   CT C-spine 06/14/20: IMPRESSION: - No evidence of acute fracture to the cervical spine. Specifically, there is no fracture of the C1 posterior arch as was questioned on the prior cervical spine radiographs of 06/05/2020. - Nonspecific reversal of the expected cervical lordosis. - Cervical spondylosis as described. Most notably at C6-C7, there is mild/moderate disc degeneration with a disc bulge, uncovertebral hypertrophy and ossification of the posterior longitudinal ligament. Suspected at least moderate spinal canal stenosis at this level.   MRI Head & MRA  Head/Neck 05/05/20: IMPRESSION: 1. Normal MRI of the brain. 2. Normal MR angiography of the neck vessels. Evaluation of the right vertebral artery is somewhat suboptimal due to prominent venous contrast. Based on the normal appearance of the noncontrast exam, I do not have any significant suspicion regarding right vertebral pathology. If concern persists specifically regarding right vertebral pathology, CT angiography could be considered. 3. Normal intracranial MR angiography.    EKG:  EKG 03/25/21 ([facility]): Per Narrative: Unusual P axis, possible ectopic atrial bradycardia  Nonspecific ST and T wave abnormality  When compared with ECG of 10-Dec-2020 15:10,  Ectopic atrial rhythm has replaced Sinus rhythm  Confirmed by Howell Rucks ([id number]) on 03/25/2021  11:58:41 AM  - Copy of tracing on shadow chart. Flat p waves in leads V6, II, possible inverted p wave in III with artifact in aVF, other leads with upright p waves.  EKG 02/19/21: Normal sinus rhythm Incomplete right bundle branch block Nonspecific ST and T wave abnormality Abnormal ECG No significant change since last tracing Confirmed by Camnitz, Will [id number]) on 02/20/2021 9:29:30 AM - EKG appears similar to 05/05/20 tracing, and likely 03/19/17 tracing although that tracing has baseline wanderer making comparison more difficult.   CV: Denied prior stress, echo, cardiac cath.   Past Medical History:  Diagnosis Date   Anemia    Bilateral carpal tunnel syndrome    Diabetes mellitus without complication ([place])    Phreesia 07/15/2020   Essential hypertension    Fibroids    Uterine unspecified   Genital herpes    Heart murmur    History of migraine headaches    Seen By Dr. Venia Minks   IBS (irritable bowel syndrome)    Migraine 07/11/2014   Morbid obesity ([place])    OSA (obstructive sleep apnea)    Sleep apnea    Phreesia 07/15/2020    Past Surgical History:  Procedure Laterality Date   CARPAL TUNNEL RELEASE Right    CESAREAN SECTION      x 2   WISDOM TOOTH EXTRACTION      MEDICATIONS:  albuterol (VENTOLIN HFA) 108 (90 Base) MCG/ACT inhaler  amLODipine (NORVASC) 5 MG tablet   Blood Glucose Monitoring Suppl (TRUE METRIX GO GLUCOSE METER) w/Device KIT   cetirizine (ZYRTEC) 10 MG tablet   Continuous Blood Gluc Receiver (DEXCOM G6 RECEIVER) DEVI   Continuous Blood Gluc Sensor (DEXCOM G6 SENSOR) MISC   Continuous Blood Gluc Transmit (DEXCOM G6 TRANSMITTER) MISC   cyclobenzaprine (FLEXERIL) 10 MG tablet   FARXIGA 5 MG TABS tablet   glucose blood (TRUE METRIX BLOOD GLUCOSE TEST) test strip   hydrOXYzine (ATARAX/VISTARIL) 50 MG tablet   ibuprofen (ADVIL) 800 MG tablet   ketoconazole (NIZORAL) 2 % shampoo   levonorgestrel (MIRENA) 20 MCG/24HR IUD    losartan-hydrochlorothiazide (HYZAAR) 50-12.5 MG tablet   meloxicam (MOBIC) 15 MG tablet   Multiple Vitamin (MULTIVITAMIN WITH MINERALS) TABS tablet   omeprazole (PRILOSEC) 20 MG capsule   ondansetron (ZOFRAN ODT) 4 MG disintegrating tablet   TRUEplus Lancets 28G MISC   valACYclovir (VALTREX) 500 MG tablet   No current facility-administered medications for this encounter.    Myra Gianotti, PA-C Surgical Short Stay/Anesthesiology [facility] Phone [phone] [facility] Phone [phone] 04/11/2021 2:49 PM

## 2021-04-14 ENCOUNTER — Other Ambulatory Visit (HOSPITAL_COMMUNITY)
Admission: RE | Admit: 2021-04-14 | Discharge: 2021-04-14 | Disposition: A | Discharge status: Home / Self Care | Payer: BC Managed Care – PPO | Source: Ambulatory Visit | Attending: Orthopaedic Surgery | Admitting: Orthopaedic Surgery

## 2021-04-14 DIAGNOSIS — Z01812 Encounter for preprocedural laboratory examination: Secondary | ICD-10-CM | POA: Diagnosis present

## 2021-04-14 DIAGNOSIS — Z20822 Contact with and (suspected) exposure to covid-19: Secondary | ICD-10-CM | POA: Diagnosis not present

## 2021-04-14 DIAGNOSIS — Z01818 Encounter for other preprocedural examination: Secondary | ICD-10-CM

## 2021-04-14 LAB — SARS CORONAVIRUS 2 (TAT 6-24 HRS): SARS Coronavirus 2: NEGATIVE

## 2021-04-16 ENCOUNTER — Ambulatory Visit (HOSPITAL_COMMUNITY): Payer: BC Managed Care – PPO | Admitting: Physician Assistant

## 2021-04-16 ENCOUNTER — Other Ambulatory Visit (HOSPITAL_COMMUNITY): Payer: Self-pay | Admitting: Orthopaedic Surgery

## 2021-04-16 ENCOUNTER — Ambulatory Visit (HOSPITAL_COMMUNITY): Payer: BC Managed Care – PPO

## 2021-04-16 ENCOUNTER — Ambulatory Visit (HOSPITAL_COMMUNITY): Payer: BC Managed Care – PPO | Admitting: Vascular Surgery

## 2021-04-16 ENCOUNTER — Encounter: Payer: Self-pay | Admitting: Dermatology

## 2021-04-16 ENCOUNTER — Encounter (HOSPITAL_COMMUNITY)
Admission: RE | Disposition: A | Discharge status: Home / Self Care | Payer: Self-pay | Source: Ambulatory Visit | Attending: Orthopaedic Surgery

## 2021-04-16 ENCOUNTER — Other Ambulatory Visit: Payer: Self-pay

## 2021-04-16 ENCOUNTER — Observation Stay (HOSPITAL_COMMUNITY)
Admission: RE | Admit: 2021-04-16 | Discharge: 2021-04-17 | Disposition: A | Discharge status: Home / Self Care | Payer: BC Managed Care – PPO | Source: Ambulatory Visit | Attending: Orthopaedic Surgery | Admitting: Orthopaedic Surgery

## 2021-04-16 ENCOUNTER — Ambulatory Visit (INDEPENDENT_AMBULATORY_CARE_PROVIDER_SITE_OTHER): Payer: BC Managed Care – PPO | Admitting: Dermatology

## 2021-04-16 ENCOUNTER — Encounter (HOSPITAL_COMMUNITY): Payer: Self-pay | Admitting: Orthopaedic Surgery

## 2021-04-16 DIAGNOSIS — Z7985 Long-term (current) use of injectable non-insulin antidiabetic drugs: Secondary | ICD-10-CM | POA: Insufficient documentation

## 2021-04-16 DIAGNOSIS — L309 Dermatitis, unspecified: Secondary | ICD-10-CM

## 2021-04-16 DIAGNOSIS — E119 Type 2 diabetes mellitus without complications: Secondary | ICD-10-CM | POA: Insufficient documentation

## 2021-04-16 DIAGNOSIS — Z79899 Other long term (current) drug therapy: Secondary | ICD-10-CM | POA: Diagnosis not present

## 2021-04-16 DIAGNOSIS — R52 Pain, unspecified: Secondary | ICD-10-CM | POA: Diagnosis not present

## 2021-04-16 DIAGNOSIS — M4722 Other spondylosis with radiculopathy, cervical region: Secondary | ICD-10-CM | POA: Diagnosis not present

## 2021-04-16 DIAGNOSIS — Z419 Encounter for procedure for purposes other than remedying health state, unspecified: Secondary | ICD-10-CM

## 2021-04-16 DIAGNOSIS — I10 Essential (primary) hypertension: Secondary | ICD-10-CM | POA: Diagnosis not present

## 2021-04-16 DIAGNOSIS — M4802 Spinal stenosis, cervical region: Secondary | ICD-10-CM | POA: Diagnosis not present

## 2021-04-16 DIAGNOSIS — L219 Seborrheic dermatitis, unspecified: Secondary | ICD-10-CM | POA: Diagnosis not present

## 2021-04-16 DIAGNOSIS — J3489 Other specified disorders of nose and nasal sinuses: Secondary | ICD-10-CM

## 2021-04-16 DIAGNOSIS — M50122 Cervical disc disorder at C5-C6 level with radiculopathy: Secondary | ICD-10-CM | POA: Insufficient documentation

## 2021-04-16 DIAGNOSIS — Z01818 Encounter for other preprocedural examination: Secondary | ICD-10-CM

## 2021-04-16 HISTORY — PX: ANTERIOR CERVICAL DECOMP/DISCECTOMY FUSION: SHX1161

## 2021-04-16 LAB — GLUCOSE, CAPILLARY
Glucose-Capillary: 101 mg/dL — ABNORMAL HIGH (ref 70–99)
Glucose-Capillary: 78 mg/dL (ref 70–99)
Glucose-Capillary: 166 mg/dL — ABNORMAL HIGH (ref 70–99)
Glucose-Capillary: 139 mg/dL — ABNORMAL HIGH (ref 70–99)

## 2021-04-16 LAB — CBC
HCT: 38.1 % (ref 36.0–46.0)
Hemoglobin: 12.1 g/dL (ref 12.0–15.0)
MCH: 26.8 pg (ref 26.0–34.0)
MCHC: 31.8 g/dL (ref 30.0–36.0)
MCV: 84.5 fL (ref 80.0–100.0)
Platelets: 325 10*3/uL (ref 150–400)
RBC: 4.51 MIL/uL (ref 3.87–5.11)
RDW: 13 % (ref 11.5–15.5)
WBC: 5.1 10*3/uL (ref 4.0–10.5)
nRBC: 0 % (ref 0.0–0.2)

## 2021-04-16 LAB — POCT PREGNANCY, URINE: Preg Test, Ur: NEGATIVE

## 2021-04-16 IMAGING — RF DG CERVICAL SPINE 2 OR 3 VIEWS
1 series · 2 of 2 positions shown · non-contrast
Comparison: [DATE].

CLINICAL DATA: C5-6 anterior fusion.

EXAM:
CERVICAL SPINE - 2-3 VIEW; DG C-ARM 1-60 MIN-NO REPORT

[Series 1: unknown protocol · 0.14mm/px · 2 of 2 slices shown]
[im 1/2]
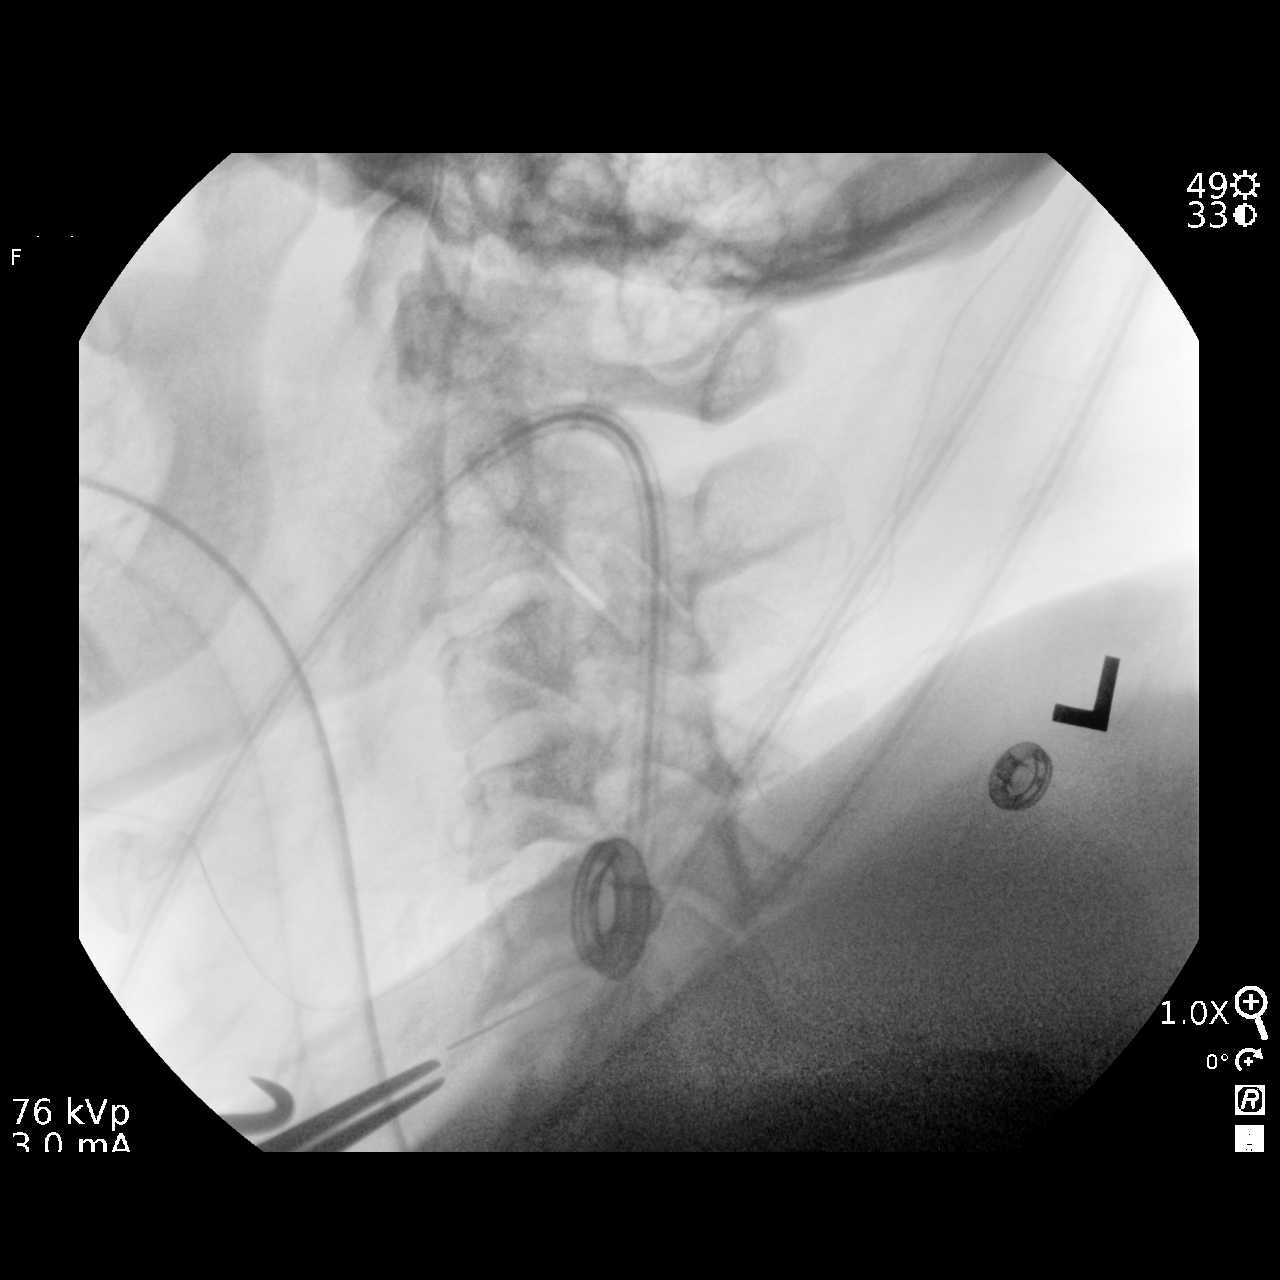
[im 2/2]
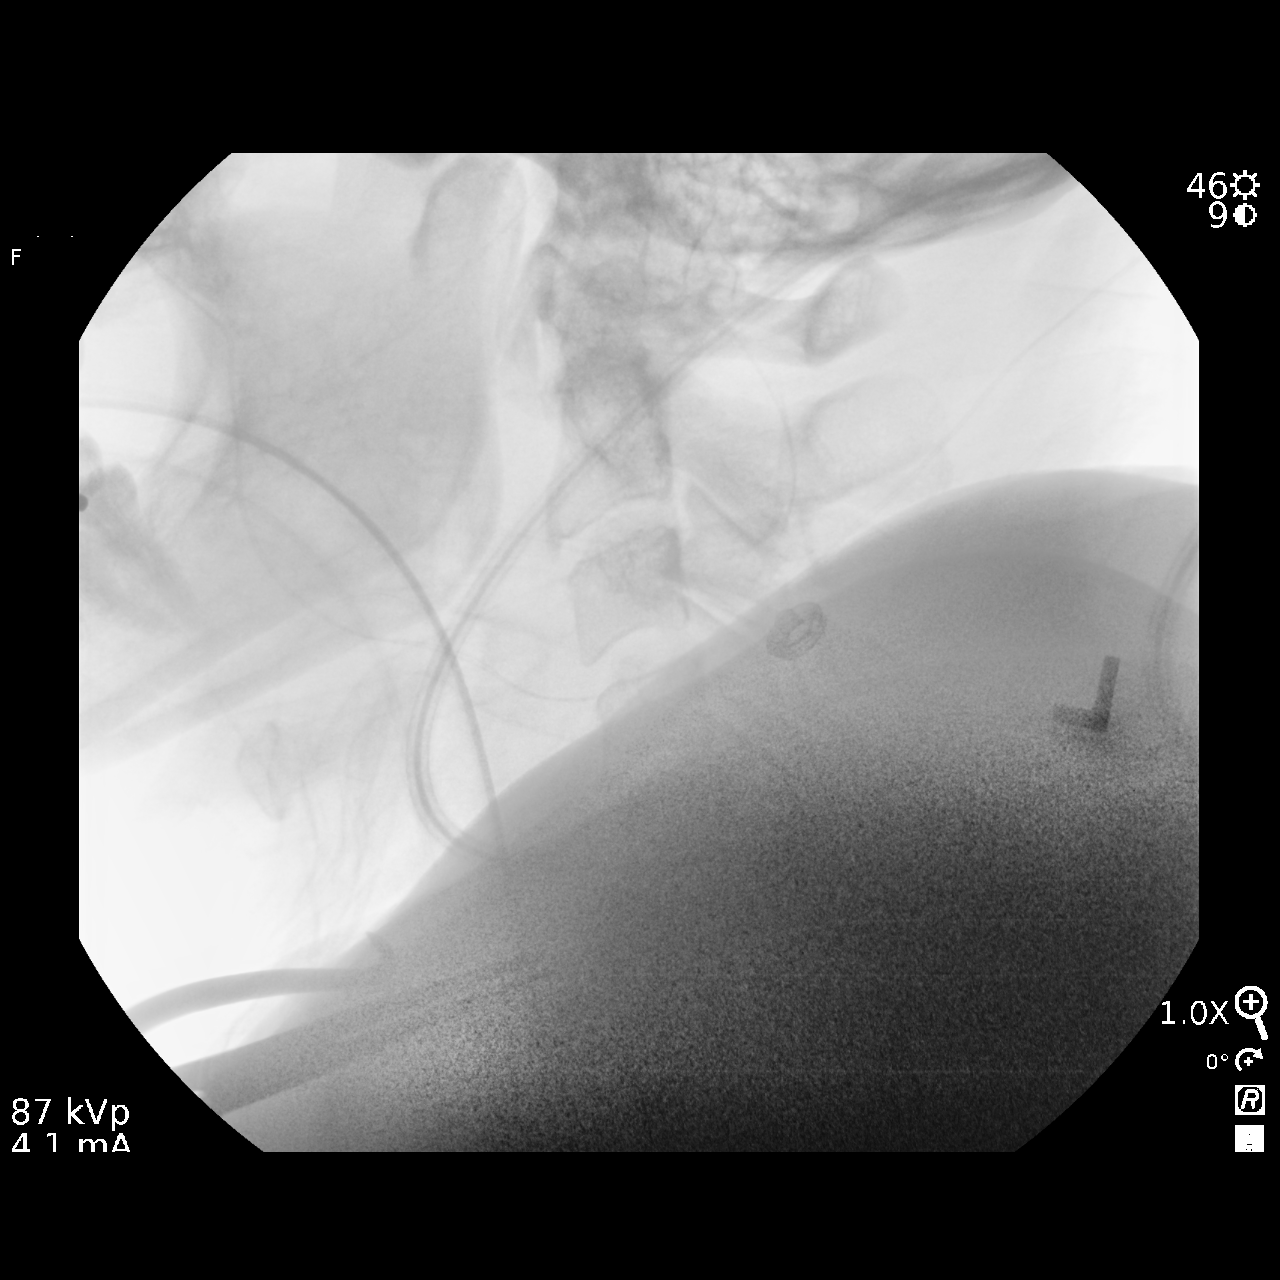

[2 of 2 positions shown; findings below may reference images not displayed]

FINDINGS: Two intraoperative fluoroscopic images were obtained of the cervical
spine. These images demonstrate surgical localization of the C5-6
disc space.
IMPRESSION: Fluoroscopic guidance provided during cervical spine surgery.

## 2021-04-16 IMAGING — RF DG CERVICAL SPINE 1V
1 series · 7 of 7 positions shown · non-contrast
Comparison: CT cervical spine [DATE]

CLINICAL DATA: ACDF C5-6 and C6-7

EXAM:
DG CERVICAL SPINE - 1 VIEW

[Series 1: unknown protocol · 0.14mm/px · 7 of 7 slices shown]
[im 1/7]
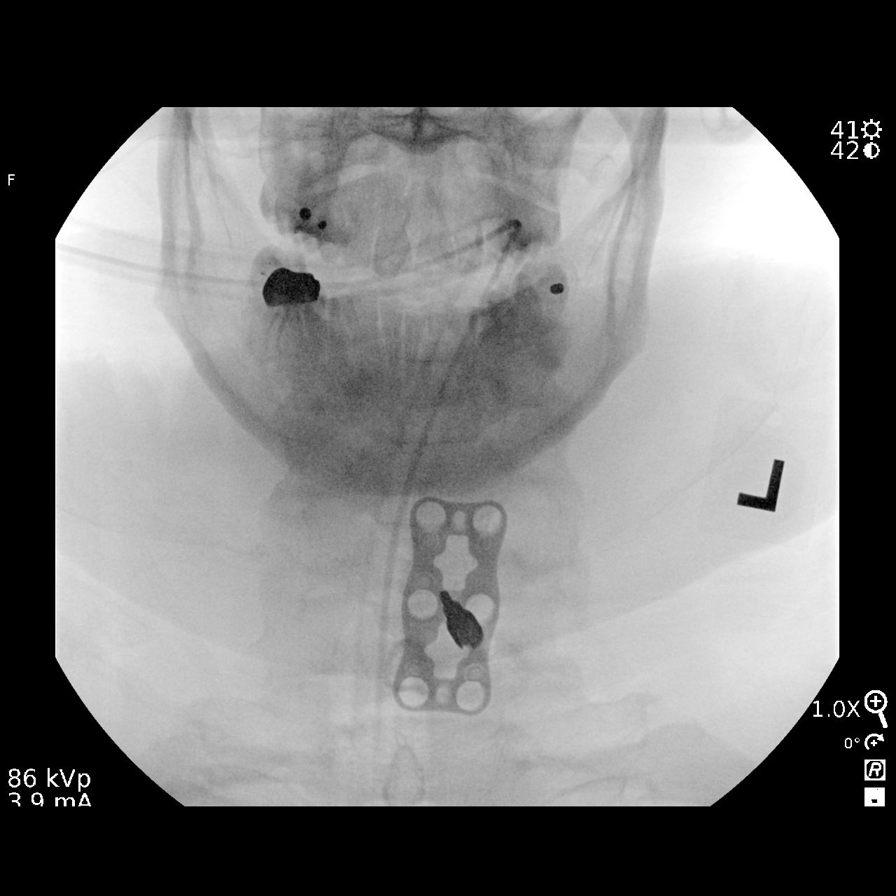
[im 2/7]
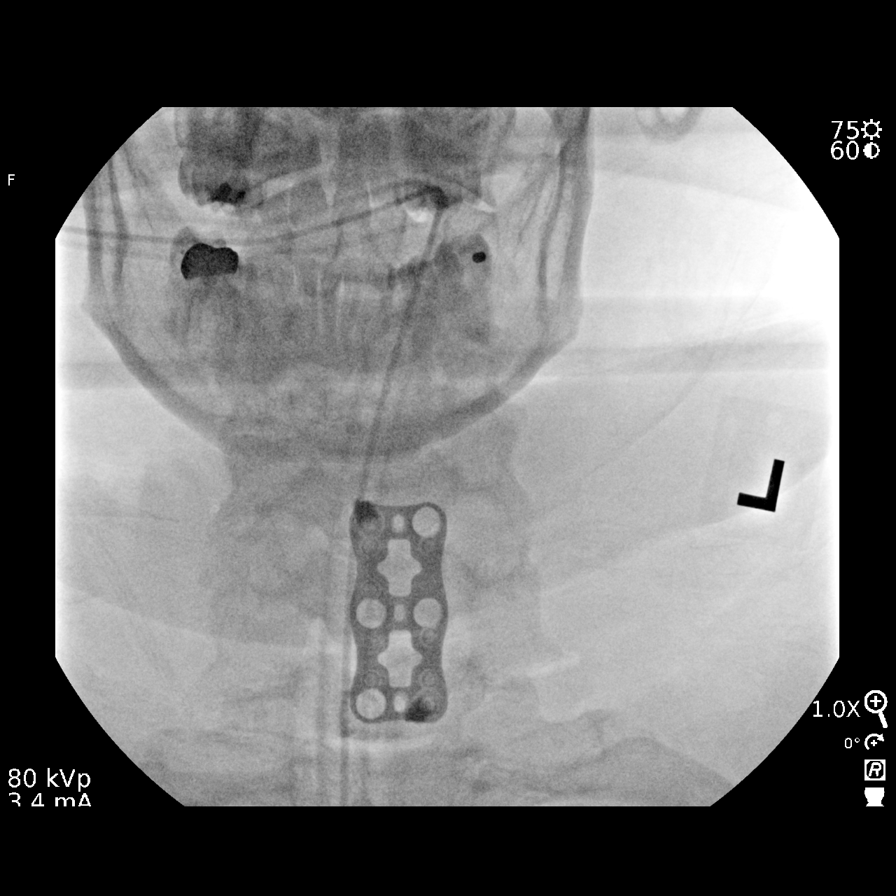
[im 3/7]
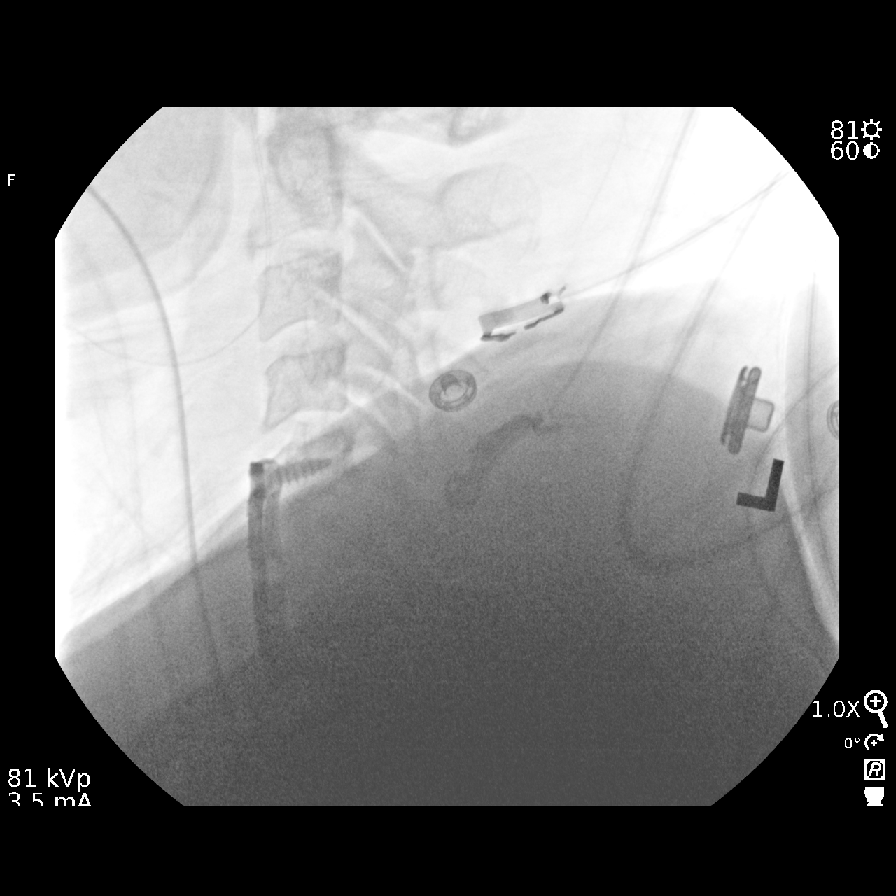
[im 4/7]
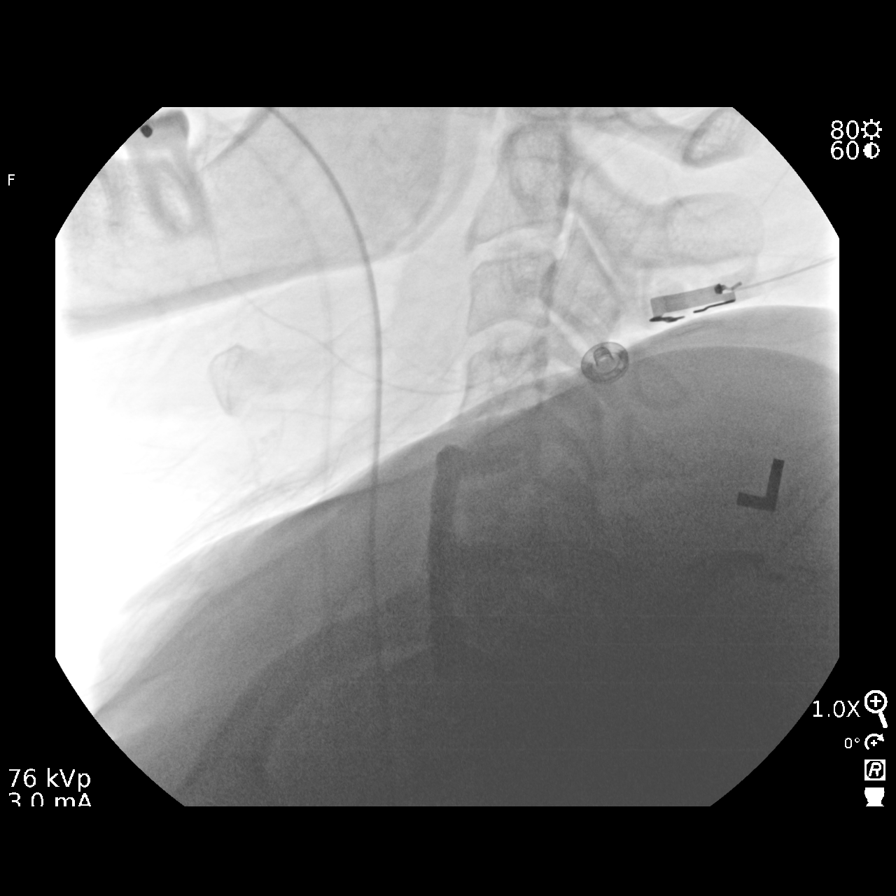
[im 5/7]
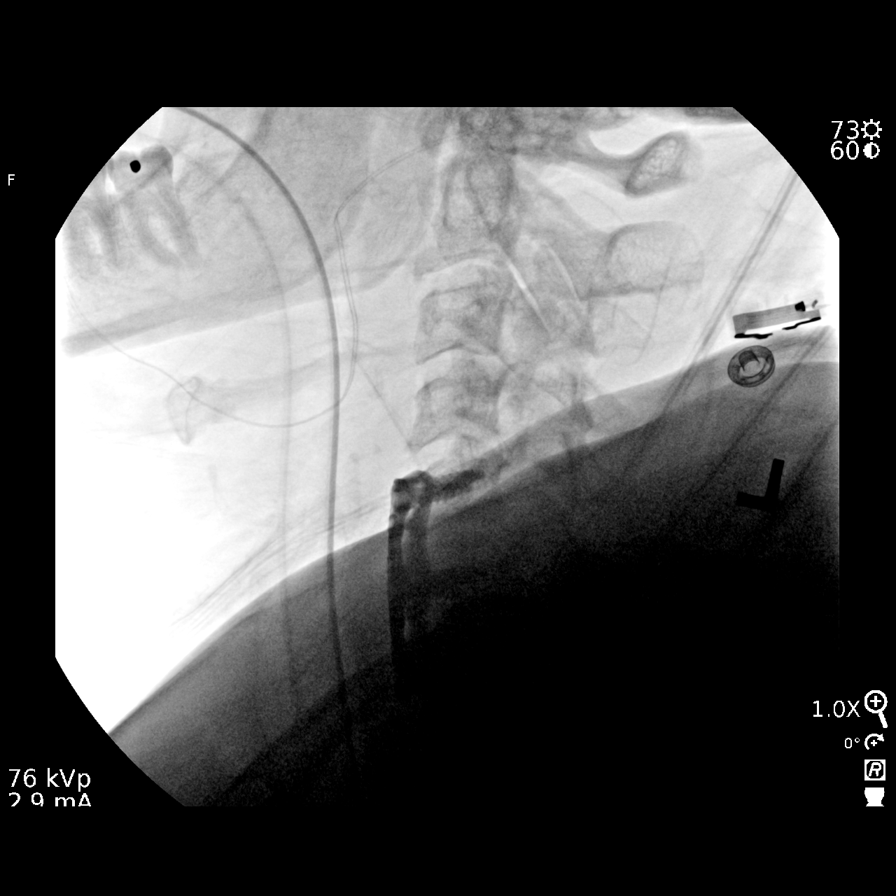
[im 6/7]
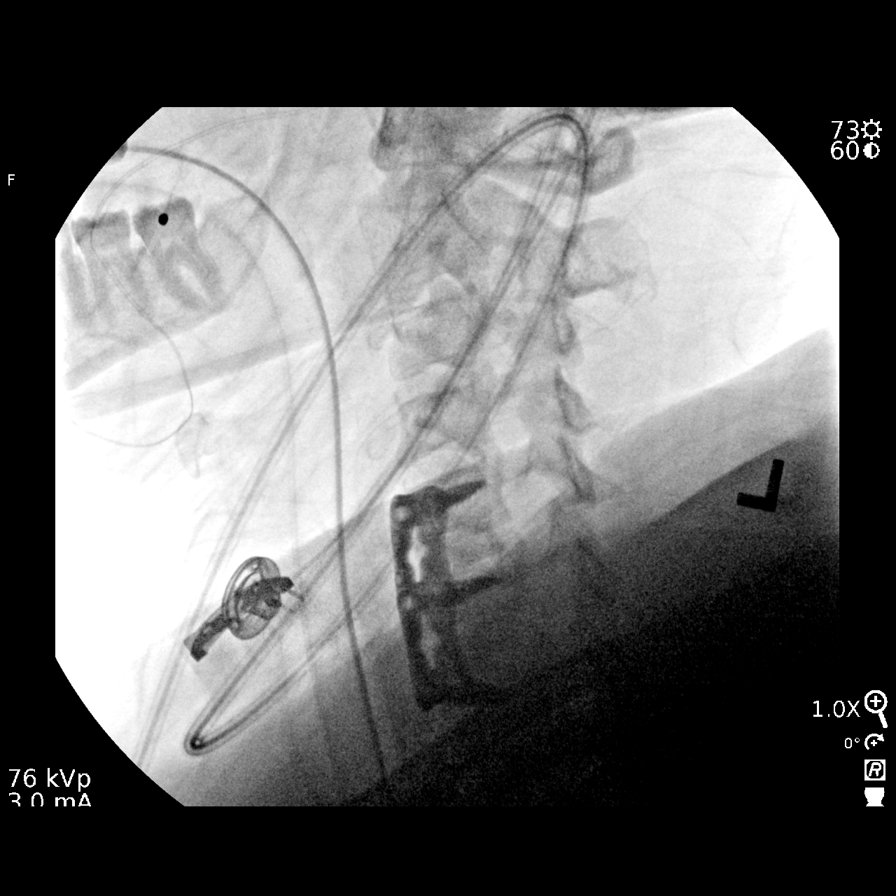
[im 7/7]
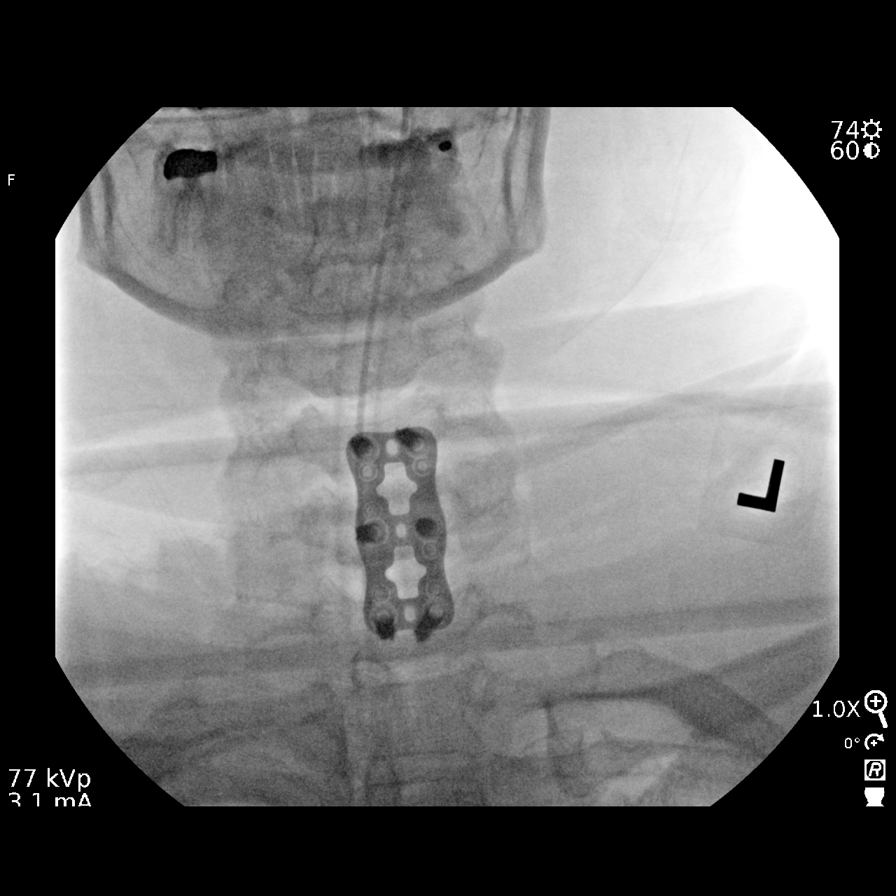

[7 of 7 positions shown; findings below may reference images not displayed]

FINDINGS: ACDF C5-6 and C6-7. Anterior plate and screws in satisfactory
position. Interbody bone graft not well seen due to overlying
shoulders.
IMPRESSION: ACDF C5-6 and C6-7.

## 2021-04-16 SURGERY — ANTERIOR CERVICAL DECOMPRESSION/DISCECTOMY FUSION 2 LEVELS
Anesthesia: General

## 2021-04-16 MED ORDER — ALBUTEROL SULFATE HFA 108 (90 BASE) MCG/ACT IN AERS: 2.0000 | INHALATION_SPRAY | RESPIRATORY_TRACT | Status: DC | PRN

## 2021-04-16 MED ORDER — ROCURONIUM BROMIDE 10 MG/ML (PF) SYRINGE
PREFILLED_SYRINGE | INTRAVENOUS | Status: AC
  Filled 2021-04-16: qty 10

## 2021-04-16 MED ORDER — ONDANSETRON HCL 4 MG/2ML IJ SOLN
INTRAMUSCULAR | Status: AC
  Filled 2021-04-16: qty 2

## 2021-04-16 MED ORDER — HYDROMORPHONE HCL 1 MG/ML IJ SOLN
INTRAMUSCULAR | Status: AC
  Filled 2021-04-16: qty 1

## 2021-04-16 MED ORDER — ONDANSETRON HCL 4 MG PO TABS: 4.0000 mg | ORAL_TABLET | Freq: Four times a day (QID) | ORAL | Status: DC | PRN

## 2021-04-16 MED ORDER — CHLORHEXIDINE GLUCONATE 0.12 % MT SOLN
15.0000 mL | Freq: Once | OROMUCOSAL | Status: AC
  Administered 2021-04-16: 15 mL via OROMUCOSAL
  Filled 2021-04-16: qty 15

## 2021-04-16 MED ORDER — LIDOCAINE 2% (20 MG/ML) 5 ML SYRINGE
INTRAMUSCULAR | Status: AC
  Filled 2021-04-16: qty 5

## 2021-04-16 MED ORDER — PROPOFOL 10 MG/ML IV BOLUS
INTRAVENOUS | Status: AC
  Filled 2021-04-16: qty 20

## 2021-04-16 MED ORDER — FENTANYL CITRATE (PF) 250 MCG/5ML IJ SOLN
INTRAMUSCULAR | Status: AC
  Filled 2021-04-16: qty 5

## 2021-04-16 MED ORDER — DEXAMETHASONE SODIUM PHOSPHATE 10 MG/ML IJ SOLN
INTRAMUSCULAR | Status: AC
  Filled 2021-04-16: qty 1

## 2021-04-16 MED ORDER — AMLODIPINE BESYLATE 5 MG PO TABS
5.0000 mg | ORAL_TABLET | Freq: Every day | ORAL | Status: DC
  Administered 2021-04-16: 5 mg via ORAL
  Filled 2021-04-16: qty 1

## 2021-04-16 MED ORDER — ONDANSETRON HCL 4 MG/2ML IJ SOLN
INTRAMUSCULAR | Status: DC | PRN
  Administered 2021-04-16: 4 mg via INTRAVENOUS

## 2021-04-16 MED ORDER — ORAL CARE MOUTH RINSE: 15.0000 mL | Freq: Once | OROMUCOSAL | Status: AC

## 2021-04-16 MED ORDER — METHOCARBAMOL 500 MG PO TABS
500.0000 mg | ORAL_TABLET | Freq: Four times a day (QID) | ORAL | Status: DC | PRN
  Administered 2021-04-16 – 2021-04-17 (×2): 500 mg via ORAL
  Filled 2021-04-16 (×2): qty 1

## 2021-04-16 MED ORDER — BUPIVACAINE HCL 0.25 % IJ SOLN
INTRAMUSCULAR | Status: DC | PRN
  Administered 2021-04-16: 6 mL

## 2021-04-16 MED ORDER — METHOCARBAMOL 1000 MG/10ML IJ SOLN
500.0000 mg | Freq: Four times a day (QID) | INTRAVENOUS | Status: DC | PRN
  Filled 2021-04-16: qty 5

## 2021-04-16 MED ORDER — LOSARTAN POTASSIUM-HCTZ 50-12.5 MG PO TABS: 1.0000 | ORAL_TABLET | Freq: Every day | ORAL | Status: DC

## 2021-04-16 MED ORDER — SODIUM CHLORIDE 0.9 % IV SOLN: 250.0000 mL | INTRAVENOUS | Status: DC

## 2021-04-16 MED ORDER — MIDAZOLAM HCL 5 MG/5ML IJ SOLN
INTRAMUSCULAR | Status: DC | PRN
  Administered 2021-04-16: 2 mg via INTRAVENOUS

## 2021-04-16 MED ORDER — LIDOCAINE 2% (20 MG/ML) 5 ML SYRINGE
INTRAMUSCULAR | Status: DC | PRN
  Administered 2021-04-16: 100 mg via INTRAVENOUS

## 2021-04-16 MED ORDER — MIDAZOLAM HCL 2 MG/2ML IJ SOLN
INTRAMUSCULAR | Status: AC
  Filled 2021-04-16: qty 2

## 2021-04-16 MED ORDER — AMISULPRIDE (ANTIEMETIC) 5 MG/2ML IV SOLN
INTRAVENOUS | Status: AC
  Filled 2021-04-16: qty 4

## 2021-04-16 MED ORDER — SUGAMMADEX SODIUM 200 MG/2ML IV SOLN
INTRAVENOUS | Status: DC | PRN
  Administered 2021-04-16: 300 mg via INTRAVENOUS

## 2021-04-16 MED ORDER — SODIUM CHLORIDE 0.9 % IV SOLN: INTRAVENOUS | Status: DC

## 2021-04-16 MED ORDER — HYDROMORPHONE HCL 1 MG/ML IJ SOLN: 0.5000 mg | INTRAMUSCULAR | Status: DC | PRN

## 2021-04-16 MED ORDER — 0.9 % SODIUM CHLORIDE (POUR BTL) OPTIME
TOPICAL | Status: DC | PRN
  Administered 2021-04-16: 1000 mL

## 2021-04-16 MED ORDER — ACETAMINOPHEN 500 MG PO TABS
1000.0000 mg | ORAL_TABLET | Freq: Once | ORAL | Status: AC
  Administered 2021-04-16: 1000 mg via ORAL

## 2021-04-16 MED ORDER — OXYCODONE HCL 5 MG PO TABS
5.0000 mg | ORAL_TABLET | ORAL | Status: DC | PRN
  Administered 2021-04-16 – 2021-04-17 (×4): 5 mg via ORAL
  Filled 2021-04-16 (×4): qty 1

## 2021-04-16 MED ORDER — LACTATED RINGERS IV SOLN: INTRAVENOUS | Status: DC

## 2021-04-16 MED ORDER — PANTOPRAZOLE SODIUM 40 MG PO TBEC: 40.0000 mg | DELAYED_RELEASE_TABLET | Freq: Every day | ORAL | Status: DC

## 2021-04-16 MED ORDER — HYDROMORPHONE HCL 1 MG/ML IJ SOLN
0.2500 mg | INTRAMUSCULAR | Status: DC | PRN
  Administered 2021-04-16 (×3): 0.5 mg via INTRAVENOUS

## 2021-04-16 MED ORDER — SURGIFLO WITH THROMBIN (HEMOSTATIC MATRIX KIT) OPTIME
TOPICAL | Status: DC | PRN
  Administered 2021-04-16: 1 via TOPICAL

## 2021-04-16 MED ORDER — SODIUM CHLORIDE 0.9% FLUSH: 3.0000 mL | INTRAVENOUS | Status: DC | PRN

## 2021-04-16 MED ORDER — LORATADINE 10 MG PO TABS
10.0000 mg | ORAL_TABLET | Freq: Every day | ORAL | Status: DC
  Administered 2021-04-16: 10 mg via ORAL
  Filled 2021-04-16: qty 1

## 2021-04-16 MED ORDER — POLYETHYLENE GLYCOL 3350 17 G PO PACK: 17.0000 g | PACK | Freq: Every day | ORAL | Status: DC

## 2021-04-16 MED ORDER — MEPERIDINE HCL 25 MG/ML IJ SOLN: 6.2500 mg | INTRAMUSCULAR | Status: DC | PRN

## 2021-04-16 MED ORDER — FENTANYL CITRATE (PF) 100 MCG/2ML IJ SOLN
INTRAMUSCULAR | Status: DC | PRN
  Administered 2021-04-16: 50 ug via INTRAVENOUS
  Administered 2021-04-16: 100 ug via INTRAVENOUS

## 2021-04-16 MED ORDER — CEFAZOLIN SODIUM-DEXTROSE 2-4 GM/100ML-% IV SOLN
2.0000 g | INTRAVENOUS | Status: AC
  Administered 2021-04-16: 2 g via INTRAVENOUS
  Filled 2021-04-16: qty 100

## 2021-04-16 MED ORDER — PROPOFOL 10 MG/ML IV BOLUS
INTRAVENOUS | Status: DC | PRN
  Administered 2021-04-16: 200 mg via INTRAVENOUS

## 2021-04-16 MED ORDER — MENTHOL 3 MG MT LOZG: 1.0000 | LOZENGE | OROMUCOSAL | Status: DC | PRN

## 2021-04-16 MED ORDER — HYDROCHLOROTHIAZIDE 12.5 MG PO TABS
12.5000 mg | ORAL_TABLET | Freq: Every day | ORAL | Status: DC
  Filled 2021-04-16: qty 1

## 2021-04-16 MED ORDER — ALBUTEROL SULFATE (2.5 MG/3ML) 0.083% IN NEBU: 2.5000 mg | INHALATION_SOLUTION | Freq: Four times a day (QID) | RESPIRATORY_TRACT | Status: DC | PRN

## 2021-04-16 MED ORDER — CLOBETASOL PROPIONATE 0.05 % EX OINT
TOPICAL_OINTMENT | Freq: Two times a day (BID) | CUTANEOUS | Status: DC
  Filled 2021-04-16: qty 15

## 2021-04-16 MED ORDER — ONDANSETRON HCL 4 MG/2ML IJ SOLN
4.0000 mg | Freq: Four times a day (QID) | INTRAMUSCULAR | Status: DC | PRN
  Administered 2021-04-16: 4 mg via INTRAVENOUS
  Filled 2021-04-16: qty 2

## 2021-04-16 MED ORDER — INSULIN ASPART 100 UNIT/ML IJ SOLN: 0.0000 [IU] | Freq: Three times a day (TID) | INTRAMUSCULAR | Status: DC

## 2021-04-16 MED ORDER — PHENOL 1.4 % MT LIQD: 1.0000 | OROMUCOSAL | Status: DC | PRN

## 2021-04-16 MED ORDER — BUPIVACAINE HCL (PF) 0.25 % IJ SOLN
INTRAMUSCULAR | Status: AC
  Filled 2021-04-16: qty 30

## 2021-04-16 MED ORDER — CLOBETASOL PROPIONATE 0.05 % EX FOAM: Freq: Two times a day (BID) | CUTANEOUS | Status: DC

## 2021-04-16 MED ORDER — AMISULPRIDE (ANTIEMETIC) 5 MG/2ML IV SOLN
10.0000 mg | Freq: Once | INTRAVENOUS | Status: AC | PRN
  Administered 2021-04-16: 10 mg via INTRAVENOUS

## 2021-04-16 MED ORDER — ACETAMINOPHEN 650 MG RE SUPP: 650.0000 mg | RECTAL | Status: DC | PRN

## 2021-04-16 MED ORDER — DEXAMETHASONE SODIUM PHOSPHATE 4 MG/ML IJ SOLN
INTRAMUSCULAR | Status: DC | PRN
  Administered 2021-04-16: 4 mg via INTRAVENOUS

## 2021-04-16 MED ORDER — ACETAMINOPHEN 325 MG PO TABS
650.0000 mg | ORAL_TABLET | ORAL | Status: DC | PRN
  Administered 2021-04-16 – 2021-04-17 (×3): 650 mg via ORAL
  Filled 2021-04-16 (×3): qty 2

## 2021-04-16 MED ORDER — SODIUM CHLORIDE 0.9% FLUSH: 3.0000 mL | Freq: Two times a day (BID) | INTRAVENOUS | Status: DC

## 2021-04-16 MED ORDER — DAPAGLIFLOZIN PROPANEDIOL 5 MG PO TABS
5.0000 mg | ORAL_TABLET | Freq: Every day | ORAL | Status: DC
  Filled 2021-04-16: qty 1

## 2021-04-16 MED ORDER — DOCUSATE SODIUM 100 MG PO CAPS
100.0000 mg | ORAL_CAPSULE | Freq: Two times a day (BID) | ORAL | Status: DC
  Administered 2021-04-16: 100 mg via ORAL

## 2021-04-16 MED ORDER — ACETAMINOPHEN 500 MG PO TABS
ORAL_TABLET | ORAL | Status: AC
  Filled 2021-04-16: qty 2

## 2021-04-16 MED ORDER — LOSARTAN POTASSIUM 50 MG PO TABS
50.0000 mg | ORAL_TABLET | Freq: Every day | ORAL | Status: DC
  Administered 2021-04-16: 50 mg via ORAL
  Filled 2021-04-16: qty 1

## 2021-04-16 MED ORDER — CLOBETASOL PROPIONATE 0.05 % EX FOAM: Freq: Two times a day (BID) | CUTANEOUS | 6 refills | Status: AC

## 2021-04-16 MED ORDER — ROCURONIUM BROMIDE 10 MG/ML (PF) SYRINGE
PREFILLED_SYRINGE | INTRAVENOUS | Status: DC | PRN
  Administered 2021-04-16: 20 mg via INTRAVENOUS
  Administered 2021-04-16: 10 mg via INTRAVENOUS
  Administered 2021-04-16: 60 mg via INTRAVENOUS
  Administered 2021-04-16: 20 mg via INTRAVENOUS

## 2021-04-16 SURGICAL SUPPLY — 56 items
BAG COUNTER SPONGE SURGICOUNT (BAG) ×2 IMPLANT
BENZOIN TINCTURE PRP APPL 2/3 (GAUZE/BANDAGES/DRESSINGS) ×2 IMPLANT
BIT DRILL SM SPINE QC 12 (BIT) ×2 IMPLANT
BLADE CLIPPER SURG (BLADE) IMPLANT
BONE CC-ACS 11X14X7 6D (Bone Implant) ×4 IMPLANT
BUR ROUND FLUTED 4 SOFT TCH (BURR) ×2 IMPLANT
CHIPS BONE CANC-ACS11X14X7 6D (Bone Implant) ×2 IMPLANT
COLLAR CERV LO CONTOUR FIRM DE (SOFTGOODS) ×2 IMPLANT
CORD BIPOLAR FORCEPS 12FT (ELECTRODE) ×2 IMPLANT
COVER SURGICAL LIGHT HANDLE (MISCELLANEOUS) ×2 IMPLANT
DRAPE C-ARM 42X72 X-RAY (DRAPES) ×2 IMPLANT
DRAPE HALF SHEET 40X57 (DRAPES) ×2 IMPLANT
DRAPE MICROSCOPE LEICA (MISCELLANEOUS) ×2 IMPLANT
DURAPREP 6ML APPLICATOR 50/CS (WOUND CARE) ×2 IMPLANT
ELECT COATED BLADE 2.86 ST (ELECTRODE) ×2 IMPLANT
ELECT REM PT RETURN 9FT ADLT (ELECTROSURGICAL) ×2
ELECTRODE REM PT RTRN 9FT ADLT (ELECTROSURGICAL) ×1 IMPLANT
EVACUATOR 1/8 PVC DRAIN (DRAIN) ×2 IMPLANT
GAUZE 4X4 16PLY ~~LOC~~+RFID DBL (SPONGE) ×2 IMPLANT
GAUZE SPONGE 4X4 12PLY STRL (GAUZE/BANDAGES/DRESSINGS) ×2 IMPLANT
GLOVE SRG 8 PF TXTR STRL LF DI (GLOVE) ×2 IMPLANT
GLOVE SURG ORTHO LTX SZ7.5 (GLOVE) ×4 IMPLANT
GLOVE SURG UNDER POLY LF SZ8 (GLOVE) ×2
GOWN STRL REUS W/ TWL LRG LVL3 (GOWN DISPOSABLE) ×1 IMPLANT
GOWN STRL REUS W/ TWL XL LVL3 (GOWN DISPOSABLE) ×1 IMPLANT
GOWN STRL REUS W/TWL 2XL LVL3 (GOWN DISPOSABLE) ×2 IMPLANT
GOWN STRL REUS W/TWL LRG LVL3 (GOWN DISPOSABLE) ×1
GOWN STRL REUS W/TWL XL LVL3 (GOWN DISPOSABLE) ×1
HALTER HD/CHIN CERV TRACTION D (MISCELLANEOUS) ×2 IMPLANT
HEMOSTAT SURGICEL 2X14 (HEMOSTASIS) IMPLANT
KIT BASIN OR (CUSTOM PROCEDURE TRAY) ×2 IMPLANT
KIT TURNOVER KIT B (KITS) ×2 IMPLANT
MANIFOLD NEPTUNE II (INSTRUMENTS) IMPLANT
NEEDLE 25GX 5/8IN NON SAFETY (NEEDLE) ×2 IMPLANT
NS IRRIG 1000ML POUR BTL (IV SOLUTION) ×2 IMPLANT
PACK ORTHO CERVICAL (CUSTOM PROCEDURE TRAY) ×2 IMPLANT
PAD ARMBOARD 7.5X6 YLW CONV (MISCELLANEOUS) ×4 IMPLANT
PATTIES SURGICAL .5 X.5 (GAUZE/BANDAGES/DRESSINGS) IMPLANT
PATTIES SURGICAL .75X.75 (GAUZE/BANDAGES/DRESSINGS) ×2 IMPLANT
PIN TEMP FIXATION KIRSCHNER (EXFIX) ×4 IMPLANT
PLATE ANT CERV XTEND 2 LV 32 (Plate) ×2 IMPLANT
POSITIONER HEAD DONUT 9IN (MISCELLANEOUS) ×2 IMPLANT
RESTRAINT LIMB HOLDER UNIV (RESTRAINTS) IMPLANT
SCREW XTD VAR 4.2 SELF TAP 12 (Screw) ×12 IMPLANT
SPONGE T-LAP 4X18 ~~LOC~~+RFID (SPONGE) ×2 IMPLANT
STRIP CLOSURE SKIN 1/2X4 (GAUZE/BANDAGES/DRESSINGS) ×2 IMPLANT
SURGIFLO W/THROMBIN 8M KIT (HEMOSTASIS) ×2 IMPLANT
SUT BONE WAX W31G (SUTURE) ×2 IMPLANT
SUT VIC AB 3-0 SH 27 (SUTURE) ×1
SUT VIC AB 3-0 SH 27X BRD (SUTURE) ×1 IMPLANT
SUT VIC AB 3-0 X1 27 (SUTURE) ×2 IMPLANT
SUT VICRYL 4-0 PS2 18IN ABS (SUTURE) ×4 IMPLANT
TOWEL GREEN STERILE (TOWEL DISPOSABLE) ×2 IMPLANT
TOWEL GREEN STERILE FF (TOWEL DISPOSABLE) ×2 IMPLANT
TRAY FOLEY W/BAG SLVR 16FR (SET/KITS/TRAYS/PACK)
TRAY FOLEY W/BAG SLVR 16FR ST (SET/KITS/TRAYS/PACK) IMPLANT

## 2021-04-16 NOTE — Op Note (Signed)
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  Postop diagnosis: Same  Procedure: C5-6, C6-7, anterior cervical discectomy and fusion, allograft and plate.  Surgeon: Rodell Perna, MD  Assistant: Benjiman Core, PA-C medically necessary and present for entire procedure  Anesthesia General oral tracheal glide scope with 6 cc Marcaine.  EBL less than 100 cc  Implants:Implants  BONE CC-ACS [id number] 6D - [id number]  Inventory Item: BONE CC-ACS [id number] 6D Serial no.: [id number] Model/Cat no.: [id number]  Implant name: BONE CC-ACS [id number] 6D - [id number] Laterality: N/A Area: Spine Cervical  Manufacturer: Gerilyn Nestle FNDN Date of Manufacture:    Action: Implanted Number Used: 1   Device Identifier:  Device Identifier Type:     BONE CC-ACS [id number] - [id number]  Inventory Item: BONE CC-ACS [id number] 6D Serial no.: [id number] Model/Cat no.: [id number]  Implant name: BONE CC-ACS [id number] 6D - [id number] Laterality: N/A Area: Spine Cervical  Manufacturer: Gerilyn Nestle FNDN Date of Manufacture:    Action: Implanted Number Used: 1   Device Identifier:  Device Identifier Type:     PLATE ANT CERV XTEND 2 LV 32 - [id number]  Inventory Item: PLATE ANT CERV XTEND 2 LV 32 Serial no.:  Model/Cat no.: [id number]  Implant name: PLATE ANT CERV XTEND 2 LV 32 - [id number] Laterality: N/A Area: Spine Cervical  Manufacturer: [place] Date of Manufacture:    Action: Implanted Number Used: 1   Device Identifier:  Device Identifier Type:     SCREW XTD VAR 4.2 SELF TAP 12 - [id number]  Inventory Item: SCREW XTD VAR 4.2 SELF TAP 12 Serial no.:  Model/Cat no.: [id number]  Implant name: SCREW XTD VAR 4.2 SELF TAP 12 - [id number] Laterality: N/A Area: Spine Cervical  Manufacturer: [place] Date of Manufacture:    Action: Implanted Number Used: 6   Device Identifier:  Device Identifier Type:     Trays  Tray Name: [place] - 1   Procedure: After induction general anesthesia orotracheal intubation had ultra traction application without weights wrist restraints due to patient's short neck and and body habitus for pulldown during C arm spot pictures during the case neck was prepped with DuraPrep there is squared with towel sterile skin marker Betadine Steri-Drape sterile Mayo stand the head thyroid sheets and drapes were applied.  Timeout procedure was completed Ancef prophylaxis.  Incision was made starting at the midline extending to the left just above the clavicle to the patient's short neck.  Thick layer of adipose tissue platysma was split blunt dissection down to the prominent spurs and short 5/ 8th inch 25 needle was placed with a straight clamp confirmed with sterilely draped lateral C arm confirming this was appropriate level we had oblique to see C5-6 level even with pulldown we started only seeing the 3-4 level.  Discectomy was performed at this level and also at C6-7.  We started C6-7 first and self-retaining retractors were placed we had to readjust the Claritin multiple times finally ended up using the neuro McCullough retractor hand have problems with a short thick neck even with the longest blades been long enough to reach down and be underneath the longus Coley muscle protecting the esophagus.  Disc was cleaned out anterior spurs removed with a bur and bear rongure.  Cloward curettes were used 1 to 2 mm size to remove posterior spurs take down the posterior longitudinal ligament exposed the dura and then trial sizers.  7 mm graft was selected at  both levels.  Identical procedure was repeated at C5-6 level and some Surgi-Flo was used for some epidural bleeding and was dry at the time of graft placement traction was pulled by the CRNA and graft was countersunk 1 mm.  Anterior spurs were trimmed to make sure was nice and smooth so the plate would sit down flat.  With both grafts in  good position C arm is used plate was selected which was a globus medical two-level 32 mm plate.  12 mm screws were used x6 in AP and lateral views to look to make sure plate was in good position.  Final spot pictures were taken and then again obliques were required in order to see the bottom portion of the plate.  We happy position operative field was dry.  Closure after Hemovac drain placed with and out technique in line with skin incision 3" platysma closure 4-0 Vicryl subcu closure tincture benzoin Steri-Strips Marcaine infiltration 6 cc 4 x 4's tape and soft collar.  Patient was transferred recovery in stable condition.

## 2021-04-16 NOTE — Discharge Instructions (Addendum)
Ok to shower 1 day postop.  Do not apply any creams or ointments to incision.  Do not remove steri-strips.    No aggressive activity. Cervical collar must be on at all times including when showering.  Do not bend or turn neck.   Your dressing is waterproof and you can take the extra collar that put in Saran wrap shower and then after you get out of the shower dry off and reapply the dry collar.  No driving.   No lifting, pushing, pulling.

## 2021-04-16 NOTE — Transfer of Care (Signed)
Immediate Anesthesia Transfer of Care Note  Patient: Carmen [name]  Procedure(s) Performed: ANTERIOR CERVICAL DISCECTOMY FUSION CERVICAL FIVE - CERVICAL SIX, CERVICAL SIX -CERVICAL SEVEN, ALLOGRAFT, PLATE  Patient Location: PACU  Anesthesia Type:General  Level of Consciousness: drowsy and patient cooperative  Airway & Oxygen Therapy: Patient Spontanous Breathing and Patient connected to nasal cannula oxygen  Post-op Assessment: Report given to RN and Post -op Vital signs reviewed and stable  Post vital signs: Reviewed and stable  Last Vitals:  Vitals Value Taken Time  BP 164/82 04/16/21 1715  Temp    Pulse 71 04/16/21 1719  Resp 17 04/16/21 1719  SpO2 93 % 04/16/21 1719  Vitals shown include unvalidated device data.  Last Pain:  Vitals:   04/16/21 1122  TempSrc:   PainSc: 0-No pain         Complications: No notable events documented.

## 2021-04-16 NOTE — Anesthesia Procedure Notes (Signed)
Procedure Name: Intubation Date/Time: 04/16/2021 2:17 PM Performed by: Vilma Meckel, CRNA Pre-anesthesia Checklist: Patient identified, Emergency Drugs available, Suction available and Patient being monitored Patient Re-evaluated:Patient Re-evaluated prior to induction Oxygen Delivery Method: Circle system utilized Preoxygenation: Pre-oxygenation with 100% oxygen Induction Type: IV induction Ventilation: Two handed mask ventilation required Laryngoscope Size: Glidescope and 3 Grade View: Grade I Tube type: Oral Tube size: 7.0 mm Number of attempts: 1 Airway Equipment and Method: Oral airway, Video-laryngoscopy and Rigid stylet Placement Confirmation: ETT inserted through vocal cords under direct vision, positive ETCO2 and breath sounds checked- equal and bilateral Secured at: 22 cm Tube secured with: Tape Dental Injury: Teeth and Oropharynx as per pre-operative assessment

## 2021-04-16 NOTE — Interval H&P Note (Signed)
History and Physical Interval Note:  04/16/2021 12:21 PM  Carmen [name]  has presented today for surgery, with the diagnosis of C5-6, C6-7 cervical disc protrusion, posterior longitudinal ligament calcification.  The various methods of treatment have been discussed with the patient and family. After consideration of risks, benefits and other options for treatment, the patient has consented to  Procedure(s): ANTERIOR CERVICAL DISCECTOMY FUSION C5-6, C6-7, ALLOGRAFT, PLATE (N/A) as a surgical intervention.  The patient's history has been reviewed, patient examined, no change in status, stable for surgery.  I have reviewed the patient's chart and labs.  Questions were answered to the patient's satisfaction.     Marybelle Killings

## 2021-04-16 NOTE — H&P (Signed)
Carmen [name] is an 40 y.o. female.   Chief Complaint: Neck pain and upper extremity radiculopathy HPI:  40 year old black female with history of C5-6 and C6-7 HNP with neck pain and left upper extremity radiculopathy comes in for preop evaluation.  States that symptoms unchanged from previous visit and she is wanting to proceed with C5-6 and C6-7 ACDF.  Today history and physical performed. Past Medical History:  Diagnosis Date   Anemia    Bilateral carpal tunnel syndrome    Diabetes mellitus without complication ([place])    Phreesia 07/15/2020   Essential hypertension    Fibroids    Uterine unspecified   Genital herpes    Heart murmur    History of migraine headaches    Seen By Dr. Venia Minks   IBS (irritable bowel syndrome)    Migraine 07/11/2014   Morbid obesity ([place])    OSA (obstructive sleep apnea)    Sleep apnea    Phreesia 07/15/2020    Past Surgical History:  Procedure Laterality Date   CARPAL TUNNEL RELEASE Right    CESAREAN SECTION      x 2   WISDOM TOOTH EXTRACTION      Family History  Problem Relation Age of Onset   Breast cancer Mother    Hypertension Mother    Hyperlipidemia Mother    Hypertension Brother    Hypertension Father    Hyperlipidemia Father    Diabetes Other    Hypercholesterolemia Other    Gout Other    Congestive Heart Failure Other    Alzheimer's disease Other    Heart attack Other    Ovarian cancer Other        Aunt   Stroke Maternal Grandmother    Kidney disease Maternal Grandmother    Heart disease Maternal Grandmother    Diabetes Paternal Grandmother    Breast cancer Maternal Aunt    Heart disease Maternal Grandfather    Hypertension Maternal Grandfather    Breast cancer Maternal Aunt    Migraines Neg Hx    Social History:  reports that she has never smoked. She has never used smokeless tobacco. She reports that she does not drink alcohol and does not use drugs.  Allergies:  Allergies  Allergen Reactions    Lisinopril Cough   Molds & Smuts Itching, Nausea Only, Rash, Tinitus and Hives    Other reaction(s): Dizziness (intolerance)   Other Hives, Itching, Swelling and Rash    House mite    Medications Prior to Admission  Medication Sig Dispense Refill   albuterol (VENTOLIN HFA) 108 (90 Base) MCG/ACT inhaler Inhale 2 puffs into the lungs every 4 (four) hours as needed for wheezing or shortness of breath.     amLODipine (NORVASC) 5 MG tablet Take 1 tablet (5 mg total) by mouth daily. 30 tablet 0   cyclobenzaprine (FLEXERIL) 10 MG tablet Take 1 tablet (10 mg total) by mouth 2 (two) times daily as needed for muscle spasms. 20 tablet 0   FARXIGA 5 MG TABS tablet TAKE 1 TABLET BY MOUTH EVERY DAY BEFORE BREAKFAST 30 tablet 0   hydrOXYzine (ATARAX/VISTARIL) 50 MG tablet Take 25-50 mg by mouth at bedtime as needed.     ibuprofen (ADVIL) 800 MG tablet TAKE 1 TABLET BY MOUTH THREE TIMES A DAY (Patient taking differently: Take 800 mg by mouth every 6 (six) hours as needed for moderate pain.) 21 tablet 0   ketoconazole (NIZORAL) 2 % shampoo 1 application 2 (two) times a week.  levonorgestrel (MIRENA) 20 MCG/24HR IUD 1 each by Intrauterine route once. Implanted December 2014     losartan-hydrochlorothiazide (HYZAAR) 50-12.5 MG tablet Take 1 tablet by mouth daily. 30 tablet 0   meloxicam (MOBIC) 15 MG tablet Take 15 mg by mouth daily as needed for pain.     Multiple Vitamin (MULTIVITAMIN WITH MINERALS) TABS tablet Take 1 tablet by mouth daily. Woman's     ondansetron (ZOFRAN ODT) 4 MG disintegrating tablet Take 1 tablet (4 mg total) by mouth every 8 (eight) hours as needed for nausea or vomiting. 20 tablet 0   valACYclovir (VALTREX) 500 MG tablet TAKE 1 TABLET (500 MG) BY MOUTH TWICE DAILY FOR 5 DAYS AS NEEDED FOR OUTBREAKS (Patient taking differently: Take 500 mg by mouth daily as needed (Cold sore).) 30 tablet 11   Blood Glucose Monitoring Suppl (TRUE METRIX GO GLUCOSE METER) w/Device KIT USE AS DIRECTED 1  kit 0   cetirizine (ZYRTEC) 10 MG tablet Take 1 tablet (10 mg total) by mouth daily. (Patient taking differently: Take 10 mg by mouth daily as needed for allergies.) 30 tablet 11   clobetasol (OLUX) 0.05 % topical foam Apply topically 2 (two) times daily. 50 g 6   Continuous Blood Gluc Receiver (DEXCOM G6 RECEIVER) DEVI 1 Device by Does not apply route 4 (four) times daily -  before meals and at bedtime. 1 each 0   Continuous Blood Gluc Sensor (DEXCOM G6 SENSOR) MISC N/A 6 each 0   Continuous Blood Gluc Transmit (DEXCOM G6 TRANSMITTER) MISC 1 DEVICE BY DOES NOT APPLY ROUTE 4 TIMES DAILY- BEFORE MEALS AND AT BEDTIME 1 each 0   glucose blood (TRUE METRIX BLOOD GLUCOSE TEST) test strip Use as instructed 100 each 12   omeprazole (PRILOSEC) 20 MG capsule TAKE 1 CAPSULE BY MOUTH EVERY DAY 90 capsule 1   TRUEplus Lancets 28G MISC Use as directed 100 each 4    Results for orders placed or performed during the hospital encounter of 04/14/21 (from the past 48 hour(s))  SARS CORONAVIRUS 2 (TAT 6-24 HRS) Nasopharyngeal Nasopharyngeal Swab     Status: None   Collection Time: 04/14/21  1:29 PM   Specimen: Nasopharyngeal Swab  Result Value Ref Range   SARS Coronavirus 2 NEGATIVE NEGATIVE    Comment: (NOTE) SARS-CoV-2 target nucleic acids are NOT DETECTED.  The SARS-CoV-2 RNA is generally detectable in upper and lower respiratory specimens during the acute phase of infection. Negative results do not preclude SARS-CoV-2 infection, do not rule out co-infections with other pathogens, and should not be used as the sole basis for treatment or other patient management decisions. Negative results must be combined with clinical observations, patient history, and epidemiological information. The expected result is Negative.  Fact Sheet for Patients: SugarRoll.be  Fact Sheet for Healthcare Providers: https://www.woods-mathews.com/  This test is not yet approved or  cleared by the [place] FDA and  has been authorized for detection and/or diagnosis of SARS-CoV-2 by FDA under an Emergency Use Authorization (EUA). This EUA will remain  in effect (meaning this test can be used) for the duration of the COVID-19 declaration under Se ction 564(b)(1) of the Act, 21 U.S.C. section 360bbb-3(b)(1), unless the authorization is terminated or revoked sooner.  Performed at [facility], [place] [address], [place], [place] [address]    No results found.  Review of Systems  Constitutional:  Positive for activity change.  HENT: Negative.    Respiratory: Negative.    Gastrointestinal: Negative.   Genitourinary:  Negative.   Musculoskeletal:  Positive for neck pain and neck stiffness.  Neurological:  Positive for numbness.  Psychiatric/Behavioral: Negative.     There were no vitals taken for this visit. Physical Exam HENT:     Head: Normocephalic.     Nose: Nose normal.  Cardiovascular:     Rate and Rhythm: Regular rhythm.     Heart sounds: Normal heart sounds.  Pulmonary:     Effort: Pulmonary effort is normal. No respiratory distress.     Breath sounds: Normal breath sounds.  Abdominal:     General: There is no distension.     Hernia: No hernia is present.  Musculoskeletal:        General: Tenderness present.  Neurological:     Mental Status: She is alert and oriented to person, place, and time.  Psychiatric:        Mood and Affect: Mood normal.     Assessment/Plan C5-6 and C6-7 HNP/stenosis. We will proceed with C5-6 and C6-7 ACDF as scheduled.  Surgical procedure discussed along with potential recovery time.  All questions answered.  Benjiman Core, PA-C 04/16/2021, 10:39 AM

## 2021-04-17 ENCOUNTER — Ambulatory Visit: Payer: BC Managed Care – PPO | Admitting: Surgery

## 2021-04-17 ENCOUNTER — Telehealth: Payer: Self-pay

## 2021-04-17 ENCOUNTER — Encounter (HOSPITAL_COMMUNITY): Payer: Self-pay | Admitting: Orthopaedic Surgery

## 2021-04-17 DIAGNOSIS — M4722 Other spondylosis with radiculopathy, cervical region: Secondary | ICD-10-CM | POA: Diagnosis not present

## 2021-04-17 LAB — GLUCOSE, CAPILLARY: Glucose-Capillary: 114 mg/dL — ABNORMAL HIGH (ref 70–99)

## 2021-04-17 MED ORDER — OXYCODONE-ACETAMINOPHEN 5-325 MG PO TABS: 1.0000 | ORAL_TABLET | ORAL | 0 refills | Status: AC | PRN

## 2021-04-17 MED ORDER — METHOCARBAMOL 500 MG PO TABS: 500.0000 mg | ORAL_TABLET | Freq: Four times a day (QID) | ORAL | 0 refills | Status: AC | PRN

## 2021-04-17 NOTE — Telephone Encounter (Signed)
noted 

## 2021-04-17 NOTE — Telephone Encounter (Signed)
Patient called stating that she is having drainage from her incision that has saturated that bandage and that she is feeling dizzy.  Stated that she currently has an ice pack on her neck.  Would like to know what she needs to do?  CB# [phone].  Please advise.  Thank you.

## 2021-04-17 NOTE — Progress Notes (Signed)
Orthopedic Tech Progress Note Patient Details:  Carmen [name] 1980/09/15 [id number]  Ortho Devices Type of Ortho Device: Soft collar Ortho Device/Splint Interventions: Ordered     Left at bedside [place] 04/17/2021, 7:30 AM

## 2021-04-17 NOTE — Progress Notes (Signed)
   Subjective: 1 Day Post-Op Procedure(s) (LRB): ANTERIOR CERVICAL DISCECTOMY FUSION CERVICAL FIVE - CERVICAL SIX, CERVICAL SIX -CERVICAL SEVEN, ALLOGRAFT, PLATE (N/A) Patient reports pain as mild.    Objective: Vital signs in last 24 hours: Temp:  [97.6 F (36.4 C)-98.6 F (37 C)] 97.6 F (36.4 C) (12/01 0744) Pulse Rate:  [51-76] 51 (12/01 0744) Resp:  [15-23] 17 (12/01 0744) BP: (124-169)/(74-95) 126/76 (12/01 0744) SpO2:  [93 %-98 %] 97 % (12/01 0744) Weight:  [119.3 kg] 119.3 kg (11/30 1103)  Intake/Output from previous day: 11/30 0701 - 12/01 0700 In: 1300 [P.O.:50; I.V.:1000; IV Piggyback:100] Out: 50 [Blood:50] Intake/Output this shift: No intake/output data recorded.  Recent Labs    04/16/21 1058  HGB 12.1   Recent Labs    04/16/21 1058  WBC 5.1  RBC 4.51  HCT 38.1  PLT 325   No results for input(s): NA, K, CL, CO2, BUN, CREATININE, GLUCOSE, CALCIUM in the last 72 hours. No results for input(s): LABPT, INR in the last 72 hours.  Neurologically intact DG Cervical Spine 1 View  Result Date: 04/16/2021 CLINICAL DATA:  ACDF C5-6 and C6-7 EXAM: DG CERVICAL SPINE - 1 VIEW COMPARISON:  CT cervical spine 06/14/2020 FINDINGS: ACDF C5-6 and C6-7. Anterior plate and screws in satisfactory position. Interbody bone graft not well seen due to overlying shoulders. IMPRESSION: ACDF C5-6 and C6-7. Electronically Signed   By: Franchot Gallo M.D.   On: 04/16/2021 18:55   DG Cervical Spine 2 or 3 views  Result Date: 04/16/2021 CLINICAL DATA:  C5-6 anterior fusion. EXAM: CERVICAL SPINE - 2-3 VIEW; DG C-ARM 1-60 MIN-NO REPORT COMPARISON:  June 05, 2020. FINDINGS: Two intraoperative fluoroscopic images were obtained of the cervical spine. These images demonstrate surgical localization of the C5-6 disc space. IMPRESSION: Fluoroscopic guidance provided during cervical spine surgery. Electronically Signed   By: Marijo Conception M.D.   On: 04/16/2021 14:58   DG C-Arm 1-60 Min-No  Report  Result Date: 04/16/2021 Fluoroscopy was utilized by the requesting physician.  No radiographic interpretation.   DG C-Arm 1-60 Min-No Report  Result Date: 04/16/2021 CLINICAL DATA:  C5-6 anterior fusion. EXAM: CERVICAL SPINE - 2-3 VIEW; DG C-ARM 1-60 MIN-NO REPORT COMPARISON:  June 05, 2020. FINDINGS: Two intraoperative fluoroscopic images were obtained of the cervical spine. These images demonstrate surgical localization of the C5-6 disc space. IMPRESSION: Fluoroscopic guidance provided during cervical spine surgery. Electronically Signed   By: Marijo Conception M.D.   On: 04/16/2021 14:58    Assessment/Plan: 1 Day Post-Op Procedure(s) (LRB): ANTERIOR CERVICAL DISCECTOMY FUSION CERVICAL FIVE - CERVICAL SIX, CERVICAL SIX -CERVICAL SEVEN, ALLOGRAFT, PLATE (N/A) Plan:   discharge home . Drain removed. Office one week.    Marybelle Killings 04/17/2021, 7:51 AM

## 2021-04-17 NOTE — Anesthesia Postprocedure Evaluation (Signed)
Anesthesia Post Note  Patient: Carmen [name]  Procedure(s) Performed: ANTERIOR CERVICAL DISCECTOMY FUSION CERVICAL FIVE - CERVICAL SIX, CERVICAL SIX -CERVICAL SEVEN, ALLOGRAFT, PLATE     Patient location during evaluation: PACU Anesthesia Type: General Level of consciousness: sedated and patient cooperative Pain management: pain level controlled Vital Signs Assessment: post-procedure vital signs reviewed and stable Respiratory status: spontaneous breathing Cardiovascular status: stable Anesthetic complications: no   No notable events documented.  Last Vitals:  Vitals:   04/17/21 0410 04/17/21 0744  BP: 124/74 126/76  Pulse: (!) 55 (!) 51  Resp: 20 17  Temp: 36.5 C 36.4 C  SpO2: 95% 97%    Last Pain:  Vitals:   04/17/21 1002  TempSrc:   PainSc: [place]

## 2021-04-17 NOTE — Plan of Care (Signed)
Patient alert and oriented, mae's well, voiding adequate amount of urine, swallowing without difficulty, no c/o pain at time of discharge. Patient discharged home with family. Script and discharged instructions given to patient. Patient and family stated understanding of instructions given. Patient has an appointment with Dr. Lorin Mercy. Patient waiting for ride

## 2021-04-17 NOTE — Telephone Encounter (Signed)
Please advise 

## 2021-04-17 NOTE — Evaluation (Signed)
Occupational Therapy Evaluation Patient Details Name: Carmen [name] MRN: [id number] DOB: 05-Oct-1980 Today's Date: 04/17/2021   History of Present Illness 40 y.o. F admitted on 11/30 for Anterior Cervival Discectomy Fusion of C5-C7. PMH significant of HTN, OSA, Anemia, B/L carpal tunnel syndrome, and DM2.   Clinical Impression   Pt seen for procedure listed above. 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 At this time, pt reports mild pain and is able to demonstrate independence with all ADL's, using compensatory strategies. Pt was educated on her cervical precautions and techniques to complete basic ADL's while following them. She has no further OT needs at this time and acute OT will sign off.       Recommendations for follow up therapy are one component of a multi-disciplinary discharge planning process, led by the attending physician.  Recommendations may be updated based on patient status, additional functional criteria and insurance authorization.   Follow Up Recommendations  No OT follow up    Assistance Recommended at Discharge None  Functional Status Assessment  Patient has not had a recent decline in their functional status  Equipment Recommendations  None recommended by OT    Recommendations for Other Services       Precautions / Restrictions Precautions Precautions: Cervical Precaution Booklet Issued: Yes (comment) Precaution Comments: Reviewed precautions and educated on compensatory strategies for ADL's. Required Braces or Orthoses: Cervical Brace Cervical Brace: Soft collar;At all times Restrictions Weight Bearing Restrictions: No      Mobility Bed Mobility Overal bed mobility: Modified Independent             General bed mobility comments: Educated on log roll technique, no difficulties    Transfers Overall transfer level: Modified independent Equipment used: None                General transfer comment: Slow to rise, no difficulty steadying or ambulating      Balance Overall balance assessment: No apparent balance deficits (not formally assessed)                                         ADL either performed or assessed with clinical judgement   ADL Overall ADL's : Independent;At baseline                                       General ADL Comments: Pt able to complete ADL's with compensatory strategies. Once Educated on techniques following cervical precautions, pt had no difficulty completing ADL's.     Vision Baseline Vision/History: 1 Wears glasses Ability to See in Adequate Light: 0 Adequate Patient Visual Report: No change from baseline Vision Assessment?: No apparent visual deficits     Perception     Praxis      Pertinent Vitals/Pain Pain Assessment: No/denies pain     Hand Dominance Right   Extremity/Trunk Assessment Upper Extremity Assessment Upper Extremity Assessment: Overall WFL for tasks assessed   Lower Extremity Assessment Lower Extremity Assessment: Overall WFL for tasks assessed   Cervical / Trunk Assessment Cervical / Trunk Assessment: Neck Surgery   Communication Communication Communication: No difficulties   Cognition Arousal/Alertness: Awake/alert Behavior During Therapy: WFL for tasks assessed/performed Overall Cognitive Status: Within Functional Limits for tasks assessed  General Comments       Exercises     Shoulder Instructions      Home Living Family/patient expects to be discharged to:: Private residence Living Arrangements: Children Available Help at Discharge: Family;Available PRN/intermittently Type of Home: Apartment Home Access: Level entry     Home Layout: One level     Bathroom Shower/Tub: Teacher, early years/pre: Standard     Home Equipment: None          Prior  Functioning/Environment Prior Level of Function : Independent/Modified Independent;Working/employed;Driving             Mobility Comments: No DME ADLs Comments: Independent        OT Problem List: Decreased range of motion;Impaired balance (sitting and/or standing);Pain      OT Treatment/Interventions:      OT Goals(Current goals can be found in the care plan section) Acute Rehab OT Goals Patient Stated Goal: To go home to her boys OT Goal Formulation: All assessment and education complete, DC therapy Time For Goal Achievement: 04/17/21 Potential to Achieve Goals: Good  OT Frequency:     Barriers to D/C:            Co-evaluation              AM-PAC OT "6 Clicks" Daily Activity     Outcome Measure Help from another person eating meals?: None Help from another person taking care of personal grooming?: None Help from another person toileting, which includes using toliet, bedpan, or urinal?: None Help from another person bathing (including washing, rinsing, drying)?: None Help from another person to put on and taking off regular upper body clothing?: None Help from another person to put on and taking off regular lower body clothing?: None 6 Click Score: 24   End of Session Equipment Utilized During Treatment: Cervical collar Nurse Communication: Mobility status  Activity Tolerance: Patient tolerated treatment well Patient left: in bed;with call bell/phone within reach  OT Visit Diagnosis: Unsteadiness on feet (R26.81);Other abnormalities of gait and mobility (R26.89);Muscle weakness (generalized) (M62.81)                Time: 7654-6503 OT Time Calculation (min): 30 min Charges:  OT General Charges $OT Visit: 1 Visit OT Evaluation $OT Eval Low Complexity: 1 Low OT Treatments $Self Care/Home Management : 8-22 mins  Jacklin Zwick H., OTR/L Acute Rehabilitation  Janina Trafton Elane Joya Willmott 04/17/2021, 10:10 AM

## 2021-04-18 ENCOUNTER — Telehealth: Payer: Self-pay

## 2021-04-18 NOTE — Telephone Encounter (Signed)
Transition Care Management Follow-up Telephone Call   Date of discharge and from where:[facility] on 04/17/2021 How have you been since you were released from the hospital? Still with some pain  Any questions or concerns? No questions/concerns reported.  Items Reviewed: Did the pt receive and understand the discharge instructions provided? have the instructions and have no questions.  Medications obtained and verified? She said that they have the medication list  and the hospital staff reviewed them in detail prior to discharge. She said has all of the medications. Any new allergies since your discharge? None reported  Do you have support at home? No Other (ie: DME, Home Health, etc)        Functional Questionnaire: (I = Independent and D = Dependent) ADL's:  Independent.        Follow up appointments reviewed:   PCP Hospital f/u appt confirmed? NP Minette Brine on 04/25/2021 @ 1050.  [facility] f/u appt confirmed? scheduled at this time  Are transportation arrangements needed? have transportation   If their condition worsens, is the pt aware to call  their PCP or go to the ED? Yes.Made pt aware if condition worsen or start experiencing rapid weight gain, chest pain, diff breathing, SOB, high fevers, or bleading to refer imediately to ED for further evaluation.  Was the patient provided with contact information for the PCP's office or ED? She has the phone number  Was the pt encouraged to call back with questions or concerns?yes

## 2021-04-21 NOTE — Progress Notes (Deleted)
TRANSITION OF CARE VISIT   Primary Care Provider (PCP):            Durene Fruits, NP                                                      [facility] [address] [place],  [place]  [address] Phone: [phone] Fax: [phone]     Date of Admission: 04/16/2021  Date of Discharge: 04/17/2021  Transitions of Care Call:   Discharged from: [facility]  Discharge Diagnosis: Cervical Spinal Stenosis   Summary of Admission: C5-6 and C6-7 HNP/stenosis. We will proceed with C5-6 and C6-7 ACDF as scheduled.  Surgical procedure discussed along with potential recovery time.  All questions answered.  TODAY's VISIT  Patient/Caregiver self-reported problems/concerns:   MEDICATIONS  Medication Reconciliation conducted with patient/caregiver? (Yes/ No): Yes  New medications prescribed/discontinued upon discharge? (Yes/No): Yes  Barriers identified related to medications: No    LABS  Lab Reviewed (Yes/No/NA): Yes  PHYSICAL EXAM:    ASSESSMENT:   PATIENT EDUCATION PROVIDED: See AVS   FOLLOW-UP (Include any further testing or referrals): Schedule an appointment with Marybelle Killings, MD (Orthopedic Surgery) in 1 week (04/24/2021); need return office visit with Dr Lorin Mercy one week postop.

## 2021-04-22 ENCOUNTER — Other Ambulatory Visit: Payer: Self-pay

## 2021-04-22 ENCOUNTER — Ambulatory Visit (INDEPENDENT_AMBULATORY_CARE_PROVIDER_SITE_OTHER): Payer: BC Managed Care – PPO

## 2021-04-22 ENCOUNTER — Ambulatory Visit (INDEPENDENT_AMBULATORY_CARE_PROVIDER_SITE_OTHER): Payer: BC Managed Care – PPO | Admitting: Orthopaedic Surgery

## 2021-04-22 ENCOUNTER — Encounter: Payer: Self-pay | Admitting: Orthopaedic Surgery

## 2021-04-22 VITALS — BP 133/85 | Ht 66.0 in | Wt 263.0 lb

## 2021-04-22 DIAGNOSIS — Z981 Arthrodesis status: Secondary | ICD-10-CM | POA: Diagnosis not present
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
## 2021-04-24 ENCOUNTER — Telehealth: Payer: Self-pay

## 2021-04-24 ENCOUNTER — Emergency Department (HOSPITAL_COMMUNITY)
Admission: EM | Admit: 2021-04-24 | Discharge: 2021-04-25 | Disposition: A | Discharge status: Home / Self Care | Payer: BC Managed Care – PPO | Attending: Emergency Medicine | Admitting: Emergency Medicine

## 2021-04-24 ENCOUNTER — Encounter (HOSPITAL_COMMUNITY): Payer: Self-pay

## 2021-04-24 ENCOUNTER — Ambulatory Visit: Payer: BC Managed Care – PPO | Admitting: Surgery

## 2021-04-24 DIAGNOSIS — M79602 Pain in left arm: Secondary | ICD-10-CM | POA: Diagnosis not present

## 2021-04-24 DIAGNOSIS — R55 Syncope and collapse: Secondary | ICD-10-CM | POA: Insufficient documentation

## 2021-04-24 DIAGNOSIS — Z79899 Other long term (current) drug therapy: Secondary | ICD-10-CM | POA: Diagnosis not present

## 2021-04-24 DIAGNOSIS — N9489 Other specified conditions associated with female genital organs and menstrual cycle: Secondary | ICD-10-CM | POA: Diagnosis not present

## 2021-04-24 DIAGNOSIS — I10 Essential (primary) hypertension: Secondary | ICD-10-CM | POA: Diagnosis not present

## 2021-04-24 DIAGNOSIS — G8918 Other acute postprocedural pain: Secondary | ICD-10-CM | POA: Diagnosis not present

## 2021-04-24 DIAGNOSIS — M542 Cervicalgia: Secondary | ICD-10-CM | POA: Diagnosis present

## 2021-04-24 DIAGNOSIS — E119 Type 2 diabetes mellitus without complications: Secondary | ICD-10-CM | POA: Insufficient documentation

## 2021-04-24 LAB — CBC
HCT: 39.9 % (ref 36.0–46.0)
Hemoglobin: 12.9 g/dL (ref 12.0–15.0)
MCH: 27.3 pg (ref 26.0–34.0)
MCHC: 32.3 g/dL (ref 30.0–36.0)
MCV: 84.4 fL (ref 80.0–100.0)
Platelets: 398 10*3/uL (ref 150–400)
RBC: 4.73 MIL/uL (ref 3.87–5.11)
RDW: 13 % (ref 11.5–15.5)
WBC: 7.6 10*3/uL (ref 4.0–10.5)
nRBC: 0 % (ref 0.0–0.2)

## 2021-04-24 LAB — BASIC METABOLIC PANEL
Anion gap: 6 (ref 5–15)
BUN: 12 mg/dL (ref 6–20)
CO2: 29 mmol/L (ref 22–32)
Calcium: 9.3 mg/dL (ref 8.9–10.3)
Chloride: 103 mmol/L (ref 98–111)
Creatinine, Ser: 0.88 mg/dL (ref 0.44–1.00)
GFR, Estimated: >60 mL/min (ref >60)
Glucose, Bld: 107 mg/dL — ABNORMAL HIGH (ref 70–99)
Potassium: 3.6 mmol/L (ref 3.5–5.1)
Sodium: 138 mmol/L (ref 135–145)

## 2021-04-24 LAB — TROPONIN I (HIGH SENSITIVITY): Troponin I (High Sensitivity): <2 ng/L (ref <18)

## 2021-04-24 LAB — CBG MONITORING, ED: Glucose-Capillary: 88 mg/dL (ref 70–99)

## 2021-04-24 LAB — I-STAT BETA HCG BLOOD, ED (MC, WL, AP ONLY): I-stat hCG, quantitative: <5 m[IU]/mL (ref <5)

## 2021-04-24 NOTE — ED Notes (Signed)
Pt wanted ice pack for neck. Pt was given one in the waiting room.

## 2021-04-24 NOTE — Telephone Encounter (Signed)
Pt called and stated she is having bloody drainage from her incision. No fever or chills but does feel light headed. Pt was worked in to see Jeneen Rinks this afternoon to have him look at it

## 2021-04-24 NOTE — ED Provider Notes (Signed)
Emergency Medicine Provider Triage Evaluation Note  Carmen [name] , a 40 y.o. female  was evaluated in triage.  Pt complains of near syncopal episode accompanied by lightheadedness, abnormal sensation to her chest that radiated to her arms.  Of note, recent cspine fusion. Has had blood draining from incision.  Review of Systems  Positive: Near syncope, lightheaded, abnormal sensation to chest/arms Negative: fevers  Physical Exam  BP (!) 142/92 (BP Location: Left Arm)   Pulse 63   Temp 98.1 F (36.7 C) (Oral)   Resp 16   SpO2 97%  Gen:   Awake, no distress   Resp:  Normal effort  MSK:   Moves extremities without difficulty  Other:  Heart with rrr  Medical Decision Making  Medically screening exam initiated at 1:01 PM.  Appropriate orders placed.  Carmen [name] was informed that the remainder of the evaluation will be completed by another provider, this initial triage assessment does not replace that evaluation, and the importance of remaining in the ED until their evaluation is complete.     Rodney Booze, PA-C 04/24/21 1305    Fredia Sorrow, MD 05/01/21 [phone]

## 2021-04-24 NOTE — ED Triage Notes (Signed)
Pt arrived via EMS, felt lightheaded at apt for son, had EKG done and dr concerned for inverted T waves.

## 2021-04-25 ENCOUNTER — Emergency Department (HOSPITAL_COMMUNITY): Payer: BC Managed Care – PPO

## 2021-04-25 ENCOUNTER — Encounter (HOSPITAL_COMMUNITY): Payer: Self-pay

## 2021-04-25 ENCOUNTER — Ambulatory Visit (INDEPENDENT_AMBULATORY_CARE_PROVIDER_SITE_OTHER): Payer: BC Managed Care – PPO | Admitting: Orthopaedic Surgery

## 2021-04-25 ENCOUNTER — Ambulatory Visit: Payer: BC Managed Care – PPO | Admitting: Family

## 2021-04-25 ENCOUNTER — Other Ambulatory Visit: Payer: Self-pay

## 2021-04-25 VITALS — BP 134/94 | HR 74 | Temp 98.0°F | Ht 66.0 in | Wt 263.0 lb

## 2021-04-25 DIAGNOSIS — M4802 Spinal stenosis, cervical region: Secondary | ICD-10-CM

## 2021-04-25 DIAGNOSIS — Z981 Arthrodesis status: Secondary | ICD-10-CM

## 2021-04-25 DIAGNOSIS — Z09 Encounter for follow-up examination after completed treatment for conditions other than malignant neoplasm: Secondary | ICD-10-CM

## 2021-04-25 LAB — PROTIME-INR
INR: 1.1 (ref 0.8–1.2)
Prothrombin Time: 13.8 seconds (ref 11.4–15.2)

## 2021-04-25 LAB — TROPONIN I (HIGH SENSITIVITY): Troponin I (High Sensitivity): <2 ng/L (ref <18)

## 2021-04-25 LAB — APTT: aPTT: 31 seconds (ref 24–36)

## 2021-04-25 LAB — LACTIC ACID, PLASMA: Lactic Acid, Venous: 1.1 mmol/L (ref 0.5–1.9)

## 2021-04-25 IMAGING — CT CT NECK W/ CM
4 series · 15 of 35 positions shown, 18 images · IV contrast (OMNIPAQUE 350)
Comparison: Postoperative cervical spine radiographs [DATE].

CTA chest today reported separately.

MRI cervical spine [DATE].

CLINICAL DATA: 40-year-old female with recent cervical spine
surgery. Drainage from the incision site. Postoperative pain and
swelling. Possible sepsis.

EXAM:
CT NECK WITH CONTRAST
TECHNIQUE: Multidetector CT imaging of the neck was performed using the
standard protocol following the bolus administration of intravenous
contrast.
CONTRAST:  80mL OMNIPAQUE IOHEXOL 350 MG/ML SOLN

[Series 4: axial neck · axial · 0.42mm/px · z∈[+1584,+1712]mm · 5 of 98 slices shown, 7 images]
[im 17/98  soft-tissue]
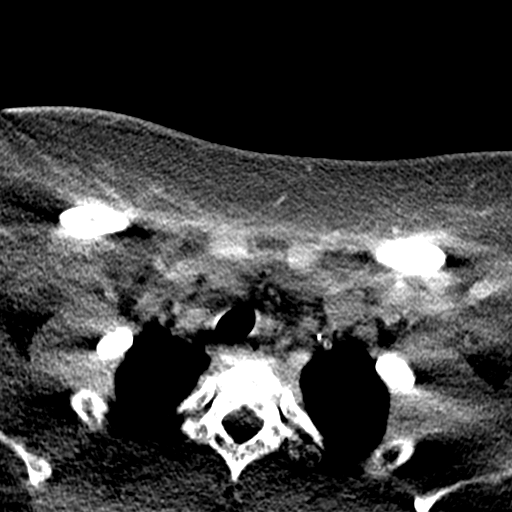
[im 17/98  bone]
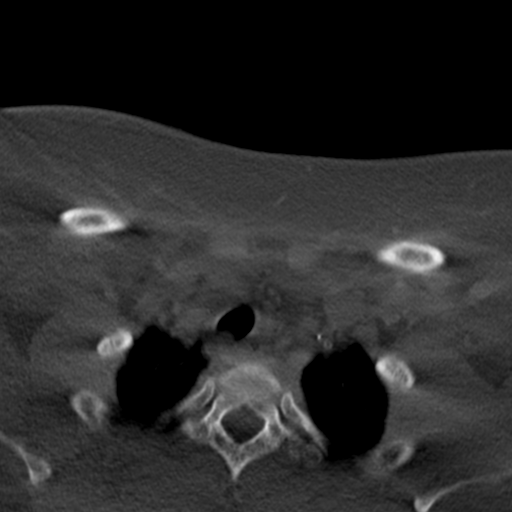
[im 33/98  bone]
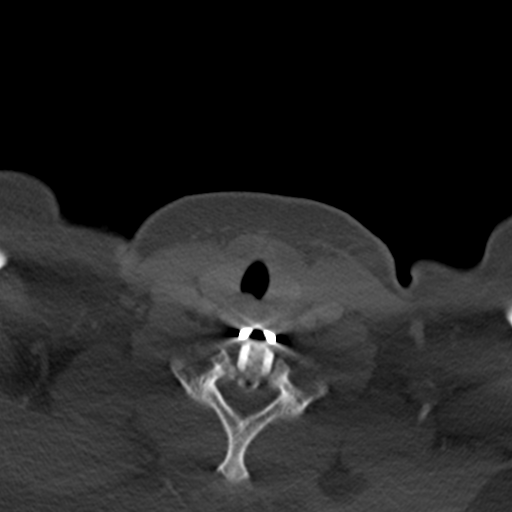
[im 49/98  bone]
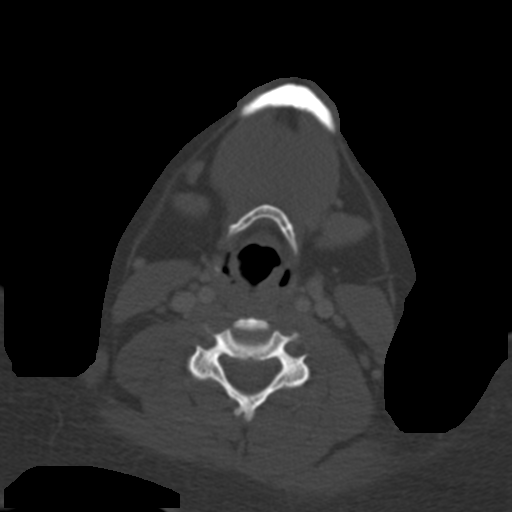
[im 65/98  bone]
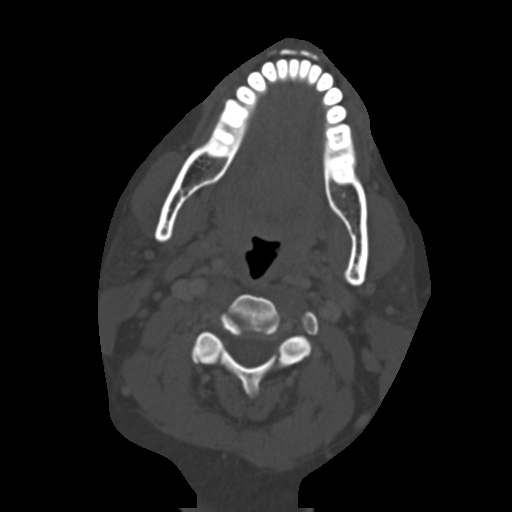
[im 81/98  soft-tissue]
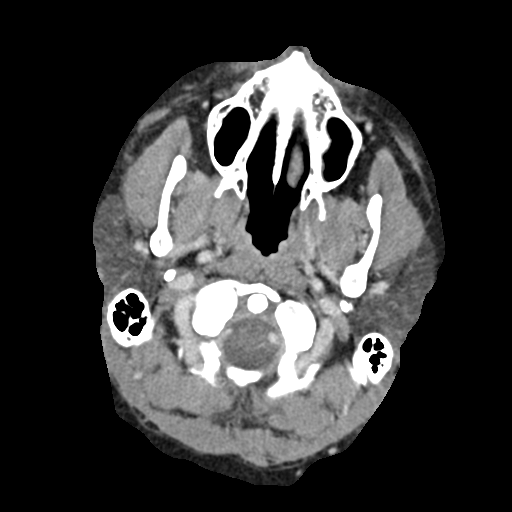
[im 81/98  bone]
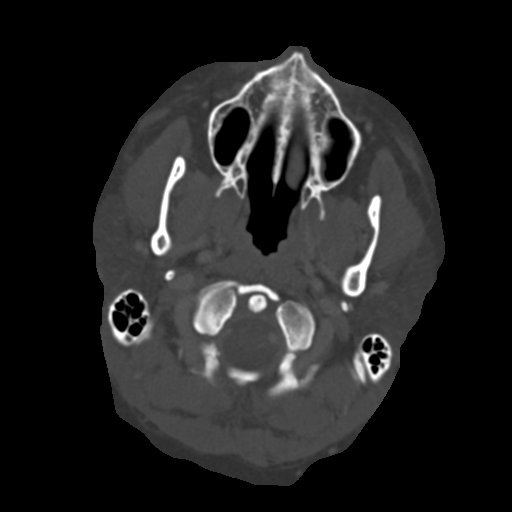

[Series 7: axial · axial · 0.39mm/px · z∈[+1575,+1608]mm · 2 of 105 slices shown]
[im 18/105  bone]
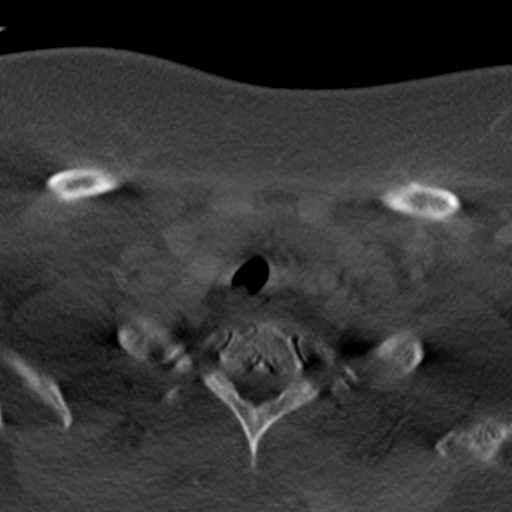
[im 35/105  bone]
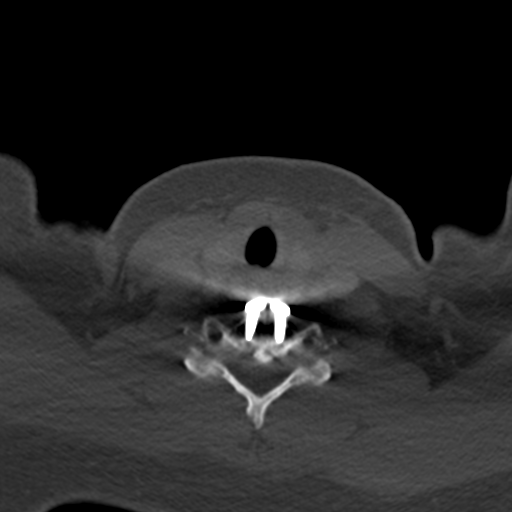

[Series 8: coronal · coronal · 0.42mm/px · 3 of 101 slices shown]
[im 21/101  bone]
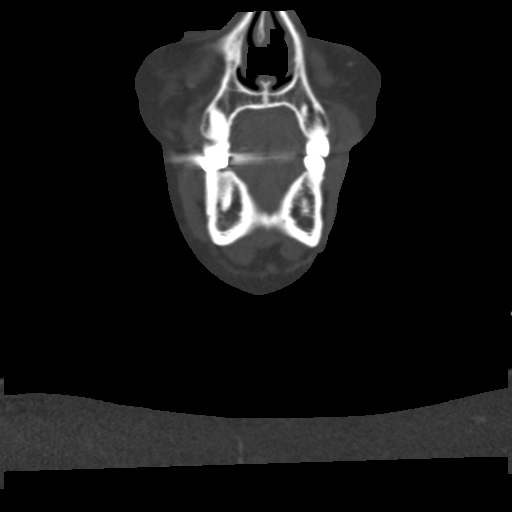
[im 41/101  bone]
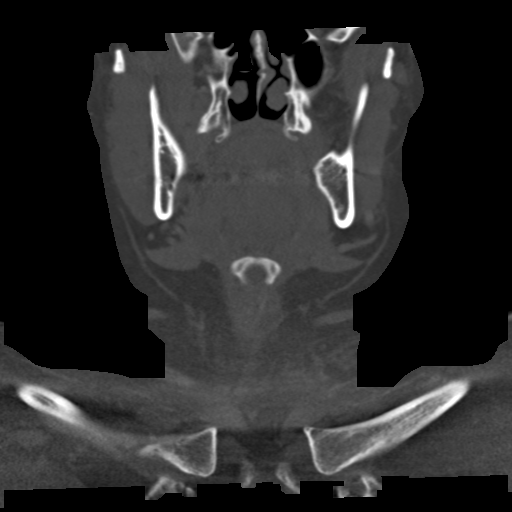
[im 61/101  bone]
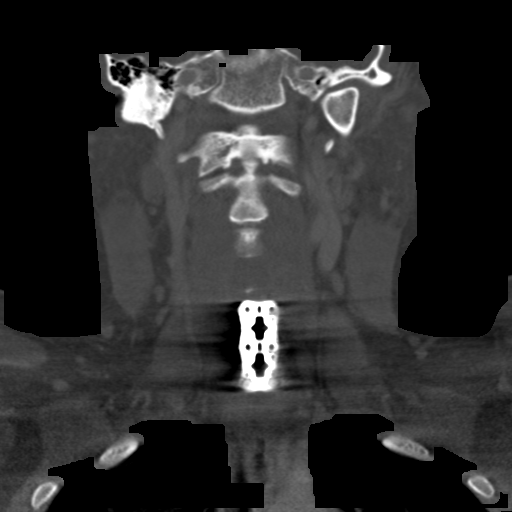

[Series 9: sagittal · sagittal · 0.42mm/px · 5 of 101 slices shown, 6 images]
[im 34/101  bone]
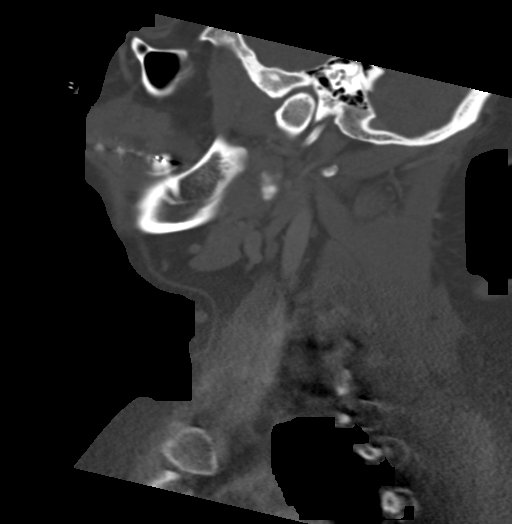
[im 42/101  bone]
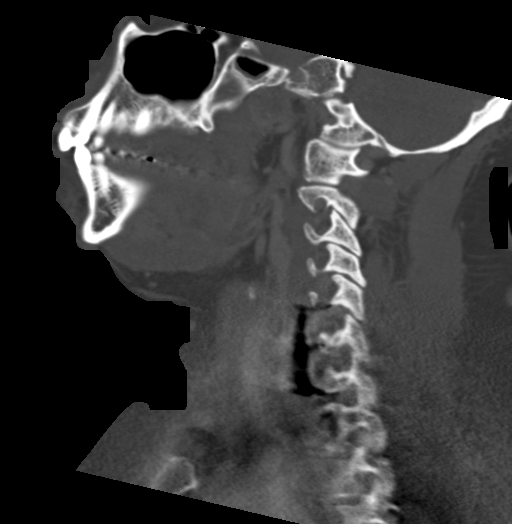
[im 51/101  soft-tissue]
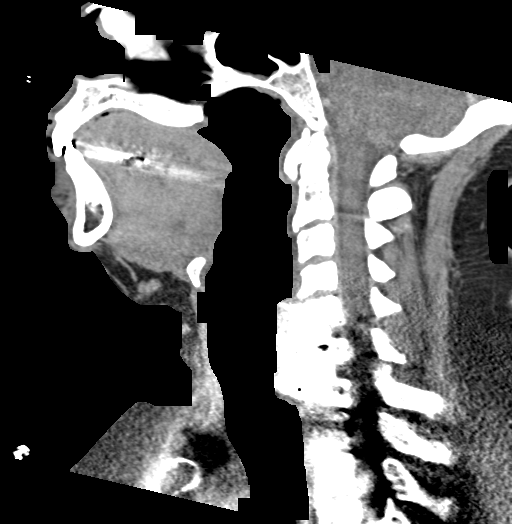
[im 51/101  bone]
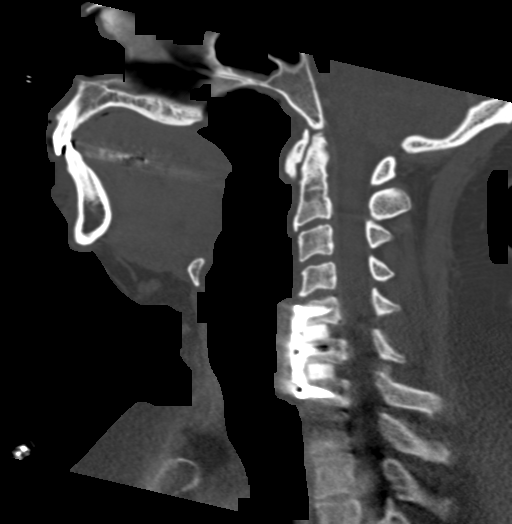
[im 59/101  bone]
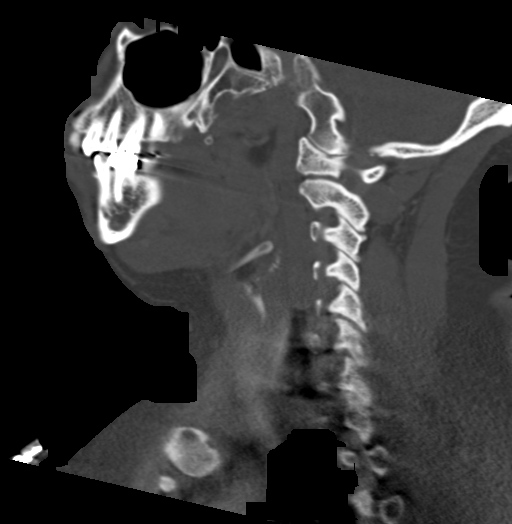
[im 67/101  bone]
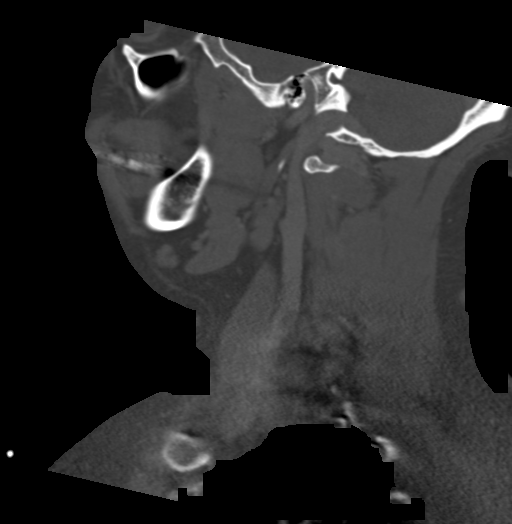

[15 of 35 positions shown; findings below may reference images not displayed]

FINDINGS: Pharynx and larynx: Laryngeal and pharyngeal soft tissue contours
remain normal. Parapharyngeal spaces are normal.

There is only mild retropharyngeal/prevertebral space soft tissue
thickening or edema.

Salivary glands: Negative.

Thyroid: Anterior to the left thyroid is an area of subcutaneous
soft tissue stranding compatible with area of the incision. This is
inseparable from the left strap muscle. But no soft tissue gas or
fluid collection is identified. Streak artifact limits detail of the
thyroid which appears grossly normal.

Lymph nodes: Negative.  No cervical lymphadenopathy.

Vascular: Major vascular structures in the neck and at the skull
base appear to remain patent, including both vertebral arteries. The
left vertebral appears dominant, as on the [facility] MRI.

Limited intracranial: Minimally included, negative.

Visualized orbits: Minimally included, negative.

Mastoids and visualized paranasal sinuses: Trace paranasal sinus
mucosal thickening and bubbly opacity. Visible tympanic cavities and
mastoids are clear.

Skeleton: C5-C6 and C6-C7 ACDF. No adverse hardware features. No
acute osseous abnormality identified.

Upper chest: CTA chest today is reported separately.
IMPRESSION: 1. Anterior to the left thyroid there is a small area of
subcutaneous inflammation which may correspond to the incision site.
No soft tissue gas or drainable fluid identified.

2. But satisfactory postoperative appearance of the deep soft tissue
spaces of the neck status post C5-C6 and C6-C7 ACDF.

3. CTA chest today is reported separately.

## 2021-04-25 IMAGING — CT CT ANGIO CHEST
2 of 6 series · 18 of 36 positions shown · IV contrast (OMNIPAQUE 350)
Comparison: Neck CT today reported separately. Portable chest x-ray
[2N] hours.

CLINICAL DATA: 40-year-old female with recent cervical spine
surgery. Drainage from the incision site. Postoperative pain and
swelling. Possible sepsis.

EXAM:
CT ANGIOGRAPHY CHEST WITH CONTRAST
TECHNIQUE: Multidetector CT imaging of the chest was performed using the
standard protocol during bolus administration of intravenous
contrast. Multiplanar CT image reconstructions and MIPs were
obtained to evaluate the vascular anatomy.
CONTRAST:  80mL OMNIPAQUE IOHEXOL 350 MG/ML SOLN

[Series 7: thins · axial · 0.62mm/px · z∈[+1380,+1602]mm · 17 of 250 slices shown]
[im 14/250  lung]
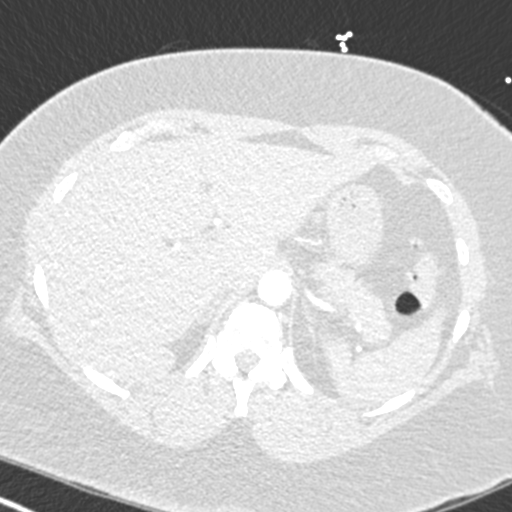
[im 28/250  mediastinal]
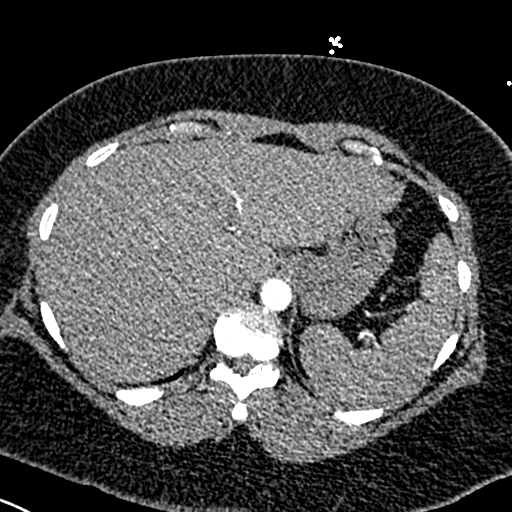
[im 42/250  lung]
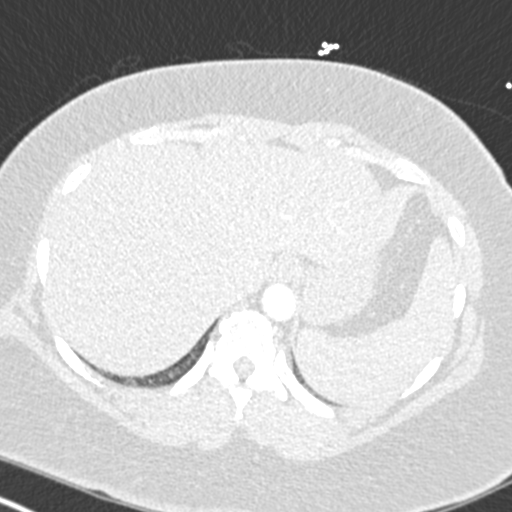
[im 56/250  mediastinal]
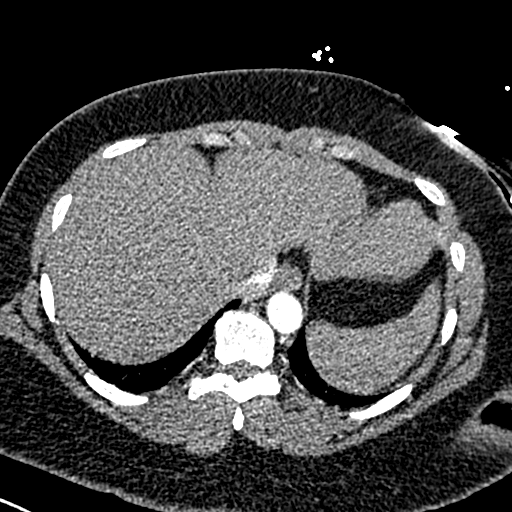
[im 70/250  lung]
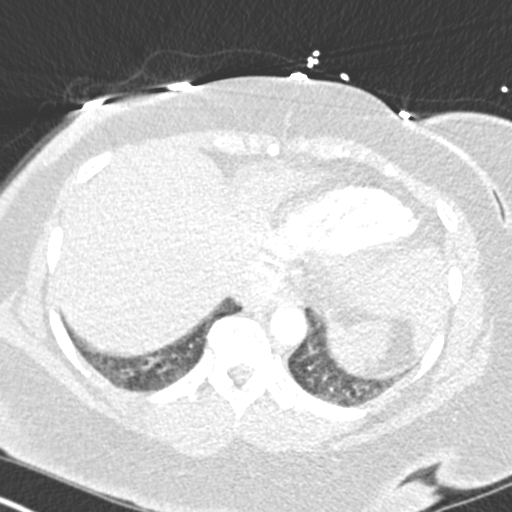
[im 84/250  mediastinal]
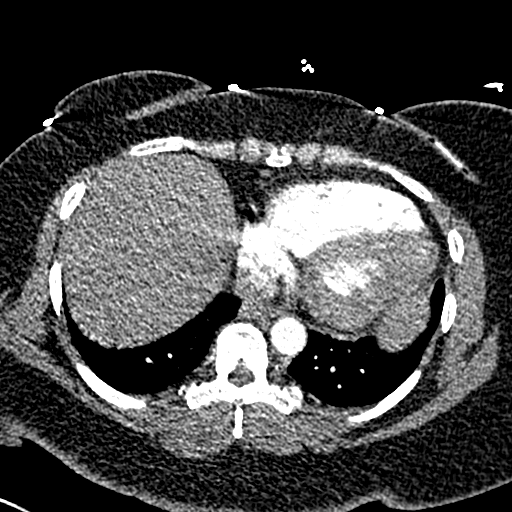
[im 97/250  lung]
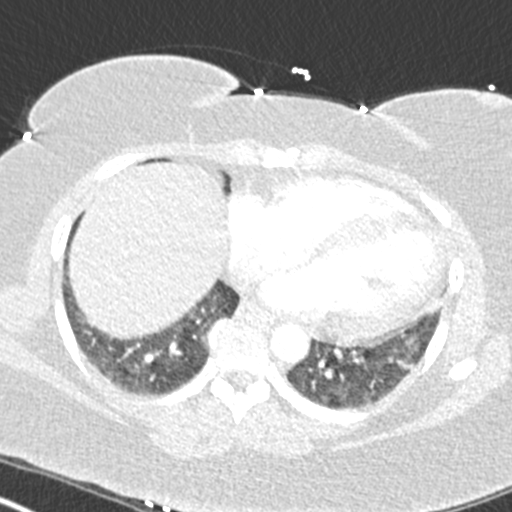
[im 111/250  mediastinal]
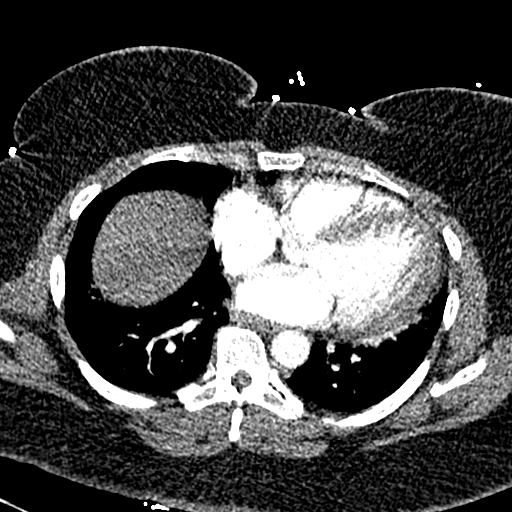
[im 125/250  lung]
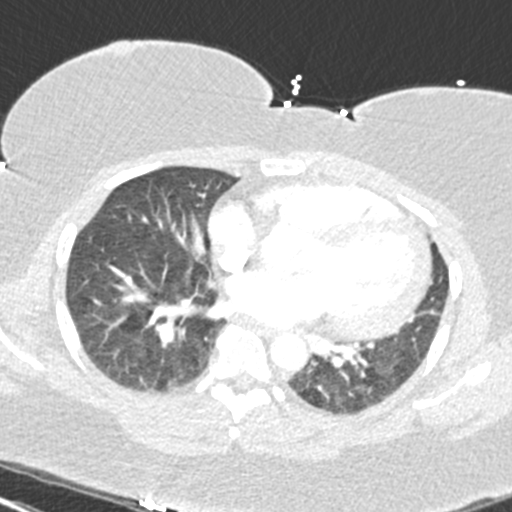
[im 139/250  mediastinal]
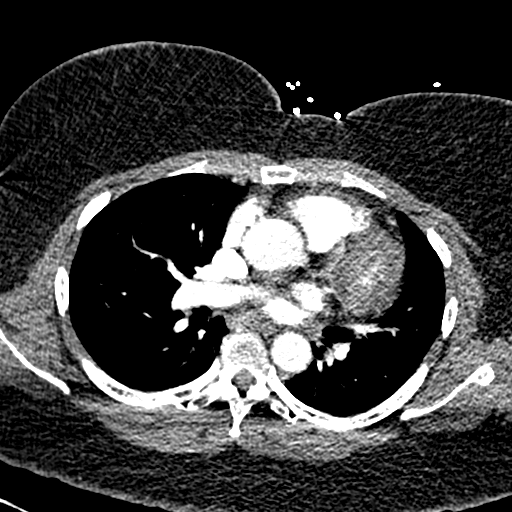
[im 153/250  lung]
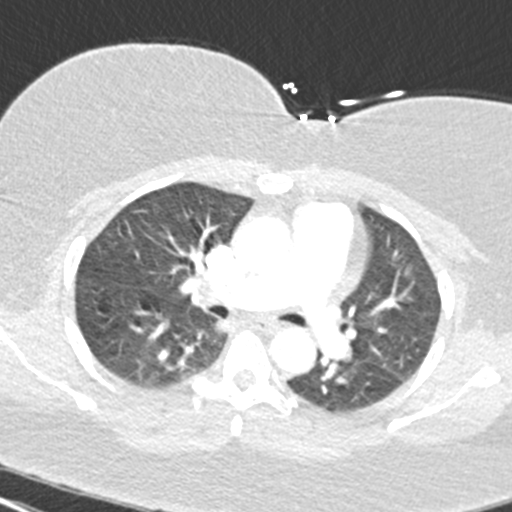
[im 167/250  mediastinal]
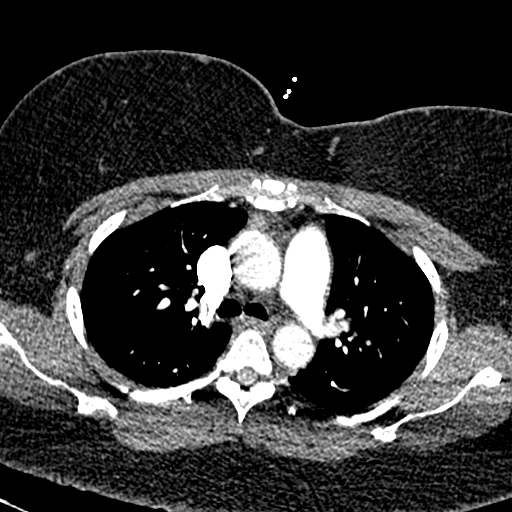
[im 180/250  lung]
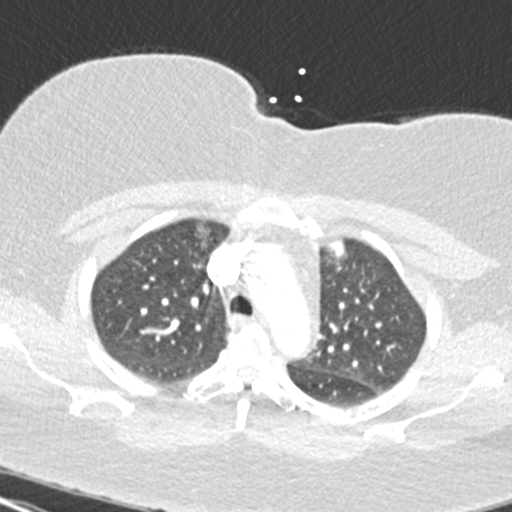
[im 194/250  mediastinal]
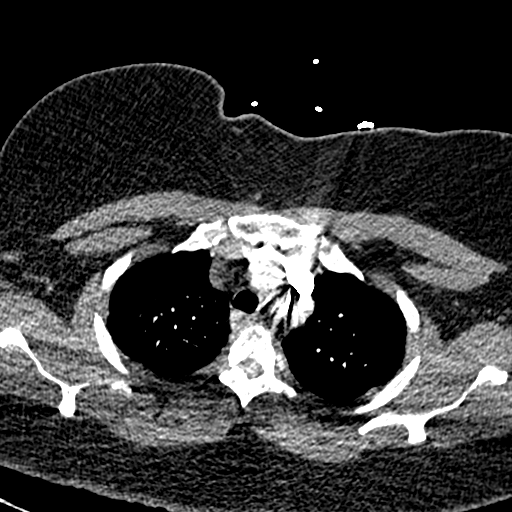
[im 208/250  lung]
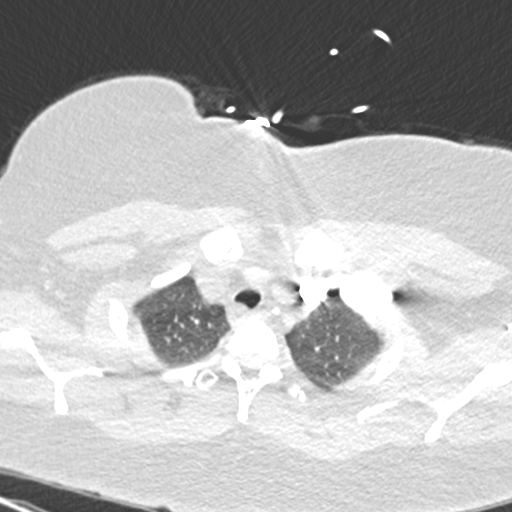
[im 222/250  mediastinal]
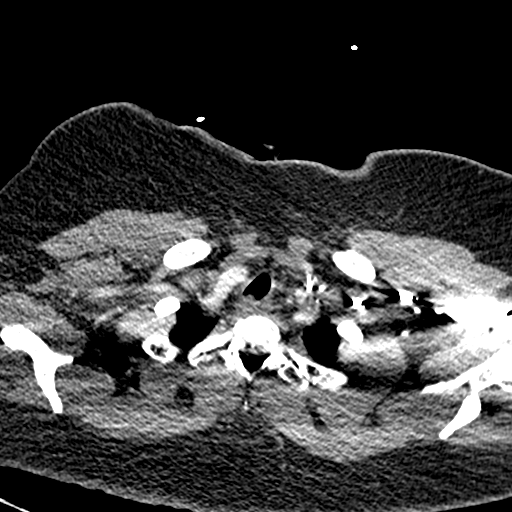
[im 236/250  lung]
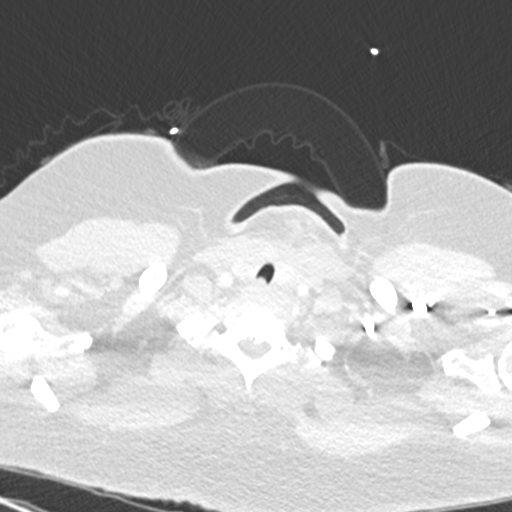

[Series 9: coronal mpr · coronal · 0.50mm/px · 1 of 132 slices shown]
[im 66/132  mediastinal]
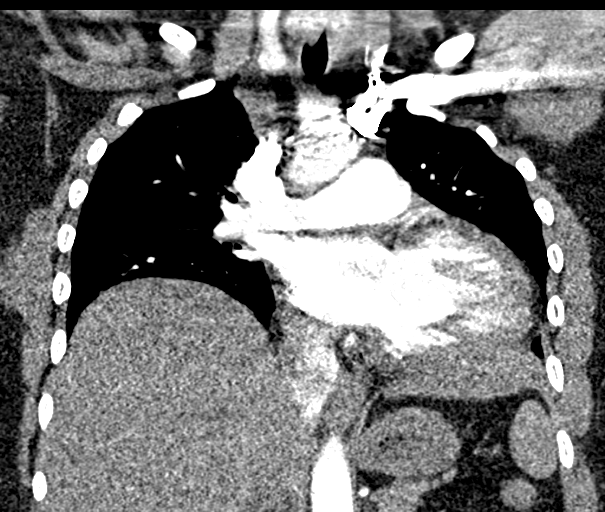

[18 of 36 positions shown; findings below may reference images not displayed]

FINDINGS: Cardiovascular: Good contrast bolus timing in the pulmonary arterial
tree. Mild respiratory motion.

No focal filling defect identified in the pulmonary arteries to
suggest acute pulmonary embolism.

Borderline to mild cardiomegaly. No pericardial effusion. Negative
visible aorta. No calcified coronary artery atherosclerosis is
evident.

Mediastinum/Nodes: Roughly 2 cm area of subcutaneous soft tissue
stranding overlying the left strap muscle and thyroid (series 6,
image 7). No regional soft tissue gas. No fluid collection. Thyroid
parenchyma remains within normal limits.

Negative mediastinum.  No lymphadenopathy.

Lungs/Pleura: Somewhat low lung volumes with atelectatic changes to
the major airways which remain patent. Mild perihilar and dependent
atelectasis in both lungs, which otherwise appear negative. No
pleural effusion or consolidation.

Upper Abdomen: Negative visible liver, spleen, pancreas, adrenal
glands, bowel and renal upper poles in the upper abdomen.

Musculoskeletal: Partially visible lower cervical ACDF, see neck CT
reported separately. No acute osseous abnormality identified. In the
chest.

Review of the MIP images confirms the above findings.
IMPRESSION: 1. Negative for acute pulmonary embolus.
2. Approximately 2 cm area of subcutaneous inflammation at the left
thoracic inlet overlying the left strap muscle and thyroid, likely
at the incision site. But no fluid collection or other complicating
features. Neck CT today reported separately.
3. Mild pulmonary atelectasis.  Borderline to mild cardiomegaly.

## 2021-04-25 MED ORDER — SODIUM CHLORIDE (PF) 0.9 % IJ SOLN
INTRAMUSCULAR | Status: AC
  Filled 2021-04-25: qty 50

## 2021-04-25 MED ORDER — CEPHALEXIN 500 MG PO CAPS: 500.0000 mg | ORAL_CAPSULE | Freq: Two times a day (BID) | ORAL | 0 refills | Status: AC

## 2021-04-25 MED ORDER — ACETAMINOPHEN 500 MG PO TABS
1000.0000 mg | ORAL_TABLET | Freq: Once | ORAL | Status: AC
  Administered 2021-04-25: 1000 mg via ORAL
  Filled 2021-04-25: qty 2

## 2021-04-25 MED ORDER — IOHEXOL 350 MG/ML SOLN
80.0000 mL | Freq: Once | INTRAVENOUS | Status: AC | PRN
  Administered 2021-04-25: 80 mL via INTRAVENOUS
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
## 2021-04-25 NOTE — Discharge Instructions (Addendum)
You may take Tylenol and ibuprofen as needed for pain.  As discussed in the room you have some inflammation your incision site without any evidence of drainable fluid collection.  This could certainly be due to your recent surgery however cannot rule out early developing infection.  I started you on an antibiotic.  Touch base with your orthopedist office felt to know you are seen here in for reevaluation.  Return for new or worsening symptoms.

## 2021-04-25 NOTE — Progress Notes (Signed)
40 year old female was in emergency room last night with some drainage from her incision and she felt lightheaded.  She has 2 children at around 47 and 16 years old which she has been taking care of.  Postop she had a little bit of bloody drainage to stop for several days when I saw her in the office it was not draining.  She is reports she got little lightheaded and was told she might look a little bit pale.  They placed her on some Keflex there is no purulence no cellulitis.  Incision in the subcutaneous tissue.  Is more firm but down deep to neck appears soft.  No dysphagia no dysphonia.  She feels some soreness in her left shoulder stops at the deltoid insertion.  Sensation to hand since surgery has returned back to normal.  Almyra Free has Steri-Strips on I put a 4 x 4 gauze on in case there is trace drainage and I will check her back again in 2 weeks.  She has my cell phone to call if there is problem.  Angio CT negative for PE.  Normal mild retropharyngeal prevertebral soft tissue swelling was noted.  Similar in findings to 1 week postop lateral radiograph.     EXAM: CT NECK WITH CONTRAST   TECHNIQUE: Multidetector CT imaging of the neck was performed using the standard protocol following the bolus administration of intravenous contrast.   CONTRAST:  59mL OMNIPAQUE IOHEXOL 350 MG/ML SOLN   COMPARISON:  Postoperative cervical spine radiographs 04/22/2021.   CTA chest today reported separately.   MRI cervical spine 12/04/2020.   FINDINGS: Pharynx and larynx: Laryngeal and pharyngeal soft tissue contours remain normal. Parapharyngeal spaces are normal.   There is only mild retropharyngeal/prevertebral space soft tissue thickening or edema.   Salivary glands: Negative.   Thyroid: Anterior to the left thyroid is an area of subcutaneous soft tissue stranding compatible with area of the incision. This is inseparable from the left strap muscle. But no soft tissue gas or fluid collection is  identified. Streak artifact limits detail of the thyroid which appears grossly normal.   Lymph nodes: Negative.  No cervical lymphadenopathy.   Vascular: Major vascular structures in the neck and at the skull base appear to remain patent, including both vertebral arteries. The left vertebral appears dominant, as on the July MRI.   Limited intracranial: Minimally included, negative.   Visualized orbits: Minimally included, negative.   Mastoids and visualized paranasal sinuses: Trace paranasal sinus mucosal thickening and bubbly opacity. Visible tympanic cavities and mastoids are clear.   Skeleton: C5-C6 and C6-C7 ACDF. No adverse hardware features. No acute osseous abnormality identified.   Upper chest: CTA chest today is reported separately.   IMPRESSION: 1. Anterior to the left thyroid there is a small area of subcutaneous inflammation which may correspond to the incision site. No soft tissue gas or drainable fluid identified.   2. But satisfactory postoperative appearance of the deep soft tissue spaces of the neck status post C5-C6 and C6-C7 ACDF.   3. CTA chest today is reported separately.     Electronically Signed   By: Genevie Ann M.D.   On: 04/25/2021 06:43  erformed as well as EKG as well as

## 2021-04-25 NOTE — ED Provider Notes (Signed)
Care assumed from previous provider at shift change.  See note for full HPI.  10 days PO cervical surgery with Dr. Lorin Mercy, Ortho  Near syncope with left arm pain. 11AM yesterday.  Incision draining per patient, however was draining at post op appointment. Left arm radiculopathy presents at last Ortho appointment as well per note.  FU on CT cervical neck and chest to r/o PE  If neg can dc home Physical Exam  BP 132/75   Pulse 66   Temp 98.1 F (36.7 C) (Oral)   Resp 20   SpO2 98%   Physical Exam Vitals and nursing note reviewed.  Constitutional:      General: She is not in acute distress.    Appearance: She is well-developed. She is not ill-appearing.  HENT:     Head: Atraumatic.  Eyes:     Pupils: Pupils are equal, round, and reactive to light.  Neck:     Comments: Soft collar in place Cardiovascular:     Rate and Rhythm: Normal rate.     Pulses:          Radial pulses are 2+ on the right side and 2+ on the left side.  Pulmonary:     Effort: No respiratory distress.  Abdominal:     General: There is no distension.  Musculoskeletal:        General: Normal range of motion.     Cervical back: Normal range of motion.  Skin:    General: Skin is warm and dry.     Capillary Refill: Capillary refill takes less than 2 seconds.  Neurological:     General: No focal deficit present.     Mental Status: She is alert and oriented to person, place, and time.     Sensory: No sensory deficit.     Motor: No weakness.  Psychiatric:        Mood and Affect: Mood normal.    ED Course/Procedures     Procedures Labs Reviewed  BASIC METABOLIC PANEL - Abnormal; Notable for the following components:      Result Value   Glucose, Bld 107 (*)    All other components within normal limits  CULTURE, BLOOD (ROUTINE X 2)  CULTURE, BLOOD (ROUTINE X 2)  CBC  LACTIC ACID, PLASMA  PROTIME-INR  APTT  URINALYSIS, ROUTINE W REFLEX MICROSCOPIC  CBG MONITORING, ED  I-STAT BETA HCG BLOOD, ED  ([facility], [facility], [facility] ONLY)  TROPONIN I (HIGH SENSITIVITY)  TROPONIN I (HIGH SENSITIVITY)      MDM FU on imaging and dispo  Imaging shows some inflammation around incision site without a drainable fluid collection.  She was could certainly be due to recent surgery however given her complaint of possible drainage from site will start on Keflex and have her follow-up with orthopedics.  Has stable vital signs.  Ambulatory here.  Reassuring work-up.  Discussed fluids, rest at home, return for new or worsening symptoms.  Patient agreeable.  The patient has been appropriately medically screened and/or stabilized in the ED. I have low suspicion for any other emergent medical condition which would require further screening, evaluation or treatment in the ED or require inpatient management.  Patient is hemodynamically stable and in no acute distress.  Patient able to ambulate in department prior to ED.  Evaluation does not show acute pathology that would require ongoing or additional emergent interventions while in the emergency department or further inpatient treatment.  I have discussed the diagnosis with the patient and answered  all questions.  Pain is been managed while in the emergency department and patient has no further complaints prior to discharge.  Patient is comfortable with plan discussed in room and is stable for discharge at this time.  I have discussed strict return precautions for returning to the emergency department.  Patient was encouraged to follow-up with PCP/specialist refer to at discharge.        Lugenia Assefa A, PA-C 04/25/21 [place], MD 04/25/21 1037

## 2021-04-30 LAB — CULTURE, BLOOD (ROUTINE X 2)
Culture: NO GROWTH
Culture: NO GROWTH
Special Requests: ADEQUATE

## 2021-05-03 ENCOUNTER — Encounter: Payer: Self-pay | Admitting: Dermatology

## 2021-05-03 NOTE — Progress Notes (Signed)
° °  New Patient   Subjective  Carmen [name] is a 40 y.o. female who presents for the following: Skin Problem (Face post ears and scalp flaking x 1 year, history of eczema patient tried ketoconazole shampoo but stated she dont wash everyday ).  Scaly rash scalp, ears, face. Location:  Duration:  Quality:  Associated Signs/Symptoms: Modifying Factors:  Severity:  Timing: Context:    The following portions of the chart were reviewed this encounter and updated as appropriate:  Tobacco   Allergies   Meds   Problems   Med Hx   Surg Hx   Fam Hx       Objective  Well appearing patient in no apparent distress; mood and affect are within normal limits. Mid Tip of Nose 2 mm pimple-like papule central lower tip of nose; duration uncertain.     Mid Parietal Scalp Patchy scale scalp and ears, no changes nails.    A focused examination was performed including head, neck, fingernails. Relevant physical exam findings are noted in the Assessment and Plan.   Assessment & Plan  Sore nose Mid Tip of Nose  Will return if this does not spontaneously clear and in 1 month.  Seborrheic dermatitis Mid Parietal Scalp  Clobetasol foam daily for 3 weeks, avoid use on face.  If responds, try to taper the use.  No specific shampoo recommended.  clobetasol (OLUX) 0.05 % topical foam - Mid Parietal Scalp Apply topically 2 (two) times daily.

## 2021-05-07 NOTE — Discharge Summary (Signed)
Patient ID: Carmen [name] MRN: [id number] DOB/AGE: 01/06/1981 40 y.o.  Admit date: 04/16/2021 Discharge date: 04/17/2021  Admission Diagnoses:  Principal Problem:   Cervical spinal stenosis   Discharge Diagnoses:  Principal Problem:   Cervical spinal stenosis  status post Procedure(s): ANTERIOR CERVICAL DISCECTOMY FUSION CERVICAL FIVE - CERVICAL SIX, CERVICAL SIX -CERVICAL SEVEN, ALLOGRAFT, PLATE  Past Medical History:  Diagnosis Date   Anemia    Bilateral carpal tunnel syndrome    Diabetes mellitus without complication (Carmen [name])    Phreesia 07/15/2020   Essential hypertension    Fibroids    Uterine unspecified   Genital herpes    Heart murmur    History of migraine headaches    Seen By Dr. Venia Minks   IBS (irritable bowel syndrome)    Migraine 07/11/2014   Morbid obesity ([place])    OSA (obstructive sleep apnea)    Sleep apnea    Phreesia 07/15/2020    Surgeries: Procedure(s): ANTERIOR CERVICAL DISCECTOMY FUSION CERVICAL FIVE - CERVICAL SIX, CERVICAL SIX -CERVICAL SEVEN, ALLOGRAFT, PLATE on 67/34/1937   Consultants:   Discharged Condition: Improved  Hospital Course: Carmen [name] is an 40 y.o. female who was admitted 04/16/2021 for operative treatment of cervical ddd. Patient failed conservative treatments (please see the history and physical for the specifics) and had severe unremitting pain that affects sleep, daily activities and work/hobbies. After pre-op clearance, the patient was taken to the operating room on 04/16/2021 and underwent  Procedure(s): ANTERIOR CERVICAL DISCECTOMY FUSION CERVICAL FIVE - CERVICAL SIX, CERVICAL SIX -CERVICAL SEVEN, ALLOGRAFT, PLATE.    Patient was given perioperative antibiotics:  Anti-infectives (From admission, onward)    Start     Dose/Rate Route Frequency Ordered Stop   04/16/21 1100  ceFAZolin (ANCEF) IVPB 2g/100 mL premix        2 g 200 mL/hr over 30 Minutes Intravenous On call to O.R. 04/16/21 1056 04/16/21  1420        Patient was given sequential compression devices and early ambulation to prevent DVT.   Patient benefited maximally from hospital stay and there were no complications. At the time of discharge, the patient was urinating/moving their bowels without difficulty, tolerating a regular diet, pain is controlled with oral pain medications and they have been cleared by PT/OT.   Recent vital signs: No data found.   Recent laboratory studies: No results for input(s): WBC, HGB, HCT, PLT, NA, K, CL, CO2, BUN, CREATININE, GLUCOSE, INR, CALCIUM in the last 72 hours.  Invalid input(s): PT, 2   Discharge Medications:   Allergies as of 04/17/2021       Reactions   Lisinopril Cough   Molds & Smuts Itching, Nausea Only, Rash, Tinitus, Hives   Other reaction(s): Dizziness (intolerance)   Other Hives, Itching, Swelling, Rash   House mite        Medication List     STOP taking these medications    cyclobenzaprine 10 MG tablet Commonly known as: FLEXERIL   ibuprofen 800 MG tablet Commonly known as: ADVIL   meloxicam 15 MG tablet Commonly known as: MOBIC       TAKE these medications    albuterol 108 (90 Base) MCG/ACT inhaler Commonly known as: VENTOLIN HFA Inhale 2 puffs into the lungs every 4 (four) hours as needed for wheezing or shortness of breath.   amLODipine 5 MG tablet Commonly known as: NORVASC Take 1 tablet (5 mg total) by mouth daily.   cetirizine 10 MG tablet Commonly known as: ZYRTEC  Take 1 tablet (10 mg total) by mouth daily. What changed:  when to take this reasons to take this   clobetasol 0.05 % topical foam Commonly known as: OLUX Apply topically 2 (two) times daily.   Dexcom G6 Receiver Devi 1 Device by Does not apply route 4 (four) times daily -  before meals and at bedtime.   Dexcom G6 Sensor Misc N/A   Dexcom G6 Transmitter Misc 1 DEVICE BY DOES NOT APPLY ROUTE 4 TIMES DAILY- BEFORE MEALS AND AT BEDTIME   Farxiga 5 MG Tabs  tablet Generic drug: dapagliflozin propanediol TAKE 1 TABLET BY MOUTH EVERY DAY BEFORE BREAKFAST   hydrOXYzine 50 MG tablet Commonly known as: ATARAX Take 25-50 mg by mouth at bedtime as needed.   ketoconazole 2 % shampoo Commonly known as: NIZORAL 1 application 2 (two) times a week.   levonorgestrel 20 MCG/24HR IUD Commonly known as: MIRENA 1 each by Intrauterine route once. Implanted December 2014   losartan-hydrochlorothiazide 50-12.5 MG tablet Commonly known as: HYZAAR Take 1 tablet by mouth daily.   methocarbamol 500 MG tablet Commonly known as: Robaxin Take 1 tablet (500 mg total) by mouth every 6 (six) hours as needed for muscle spasms.   multivitamin with minerals Tabs tablet Take 1 tablet by mouth daily. Woman's   omeprazole 20 MG capsule Commonly known as: PRILOSEC TAKE 1 CAPSULE BY MOUTH EVERY DAY   ondansetron 4 MG disintegrating tablet Commonly known as: Zofran ODT Take 1 tablet (4 mg total) by mouth every 8 (eight) hours as needed for nausea or vomiting.   oxyCODONE-acetaminophen 5-325 MG tablet Commonly known as: PERCOCET/ROXICET Take 1 tablet by mouth every 4 (four) hours as needed for severe pain.   True Metrix Blood Glucose Test test strip Generic drug: glucose blood Use as instructed   True Metrix Go Glucose Meter w/Device Kit USE AS DIRECTED   TRUEplus Lancets 28G Misc Use as directed   valACYclovir 500 MG tablet Commonly known as: VALTREX TAKE 1 TABLET (500 MG) BY MOUTH TWICE DAILY FOR 5 DAYS AS NEEDED FOR OUTBREAKS What changed: See the new instructions.        Diagnostic Studies: DG Cervical Spine 1 View  Result Date: 04/16/2021 CLINICAL DATA:  ACDF C5-6 and C6-7 EXAM: DG CERVICAL SPINE - 1 VIEW COMPARISON:  CT cervical spine 06/14/2020 FINDINGS: ACDF C5-6 and C6-7. Anterior plate and screws in satisfactory position. Interbody bone graft not well seen due to overlying shoulders. IMPRESSION: ACDF C5-6 and C6-7. Electronically Signed    By: Franchot Gallo M.D.   On: 04/16/2021 18:55   DG Cervical Spine 2 or 3 views  Result Date: 04/16/2021 CLINICAL DATA:  C5-6 anterior fusion. EXAM: CERVICAL SPINE - 2-3 VIEW; DG C-ARM 1-60 MIN-NO REPORT COMPARISON:  June 05, 2020. FINDINGS: Two intraoperative fluoroscopic images were obtained of the cervical spine. These images demonstrate surgical localization of the C5-6 disc space. IMPRESSION: Fluoroscopic guidance provided during cervical spine surgery. Electronically Signed   By: Marijo Conception M.D.   On: 04/16/2021 14:58   CT Soft Tissue Neck W Contrast  Result Date: 04/25/2021 CLINICAL DATA:  40 year old female with recent cervical spine surgery. Drainage from the incision site. Postoperative pain and swelling. Possible sepsis. EXAM: CT NECK WITH CONTRAST TECHNIQUE: Multidetector CT imaging of the neck was performed using the standard protocol following the bolus administration of intravenous contrast. CONTRAST:  63m OMNIPAQUE IOHEXOL 350 MG/ML SOLN COMPARISON:  Postoperative cervical spine radiographs 04/22/2021. CTA chest today reported separately. MRI  cervical spine 12/04/2020. FINDINGS: Pharynx and larynx: Laryngeal and pharyngeal soft tissue contours remain normal. Parapharyngeal spaces are normal. There is only mild retropharyngeal/prevertebral space soft tissue thickening or edema. Salivary glands: Negative. Thyroid: Anterior to the left thyroid is an area of subcutaneous soft tissue stranding compatible with area of the incision. This is inseparable from the left strap muscle. But no soft tissue gas or fluid collection is identified. Streak artifact limits detail of the thyroid which appears grossly normal. Lymph nodes: Negative.  No cervical lymphadenopathy. Vascular: Major vascular structures in the neck and at the skull base appear to remain patent, including both vertebral arteries. The left vertebral appears dominant, as on the July MRI. Limited intracranial: Minimally included,  negative. Visualized orbits: Minimally included, negative. Mastoids and visualized paranasal sinuses: Trace paranasal sinus mucosal thickening and bubbly opacity. Visible tympanic cavities and mastoids are clear. Skeleton: C5-C6 and C6-C7 ACDF. No adverse hardware features. No acute osseous abnormality identified. Upper chest: CTA chest today is reported separately. IMPRESSION: 1. Anterior to the left thyroid there is a small area of subcutaneous inflammation which may correspond to the incision site. No soft tissue gas or drainable fluid identified. 2. But satisfactory postoperative appearance of the deep soft tissue spaces of the neck status post C5-C6 and C6-C7 ACDF. 3. CTA chest today is reported separately. Electronically Signed   By: Genevie Ann M.D.   On: 04/25/2021 06:43   CT Angio Chest PE W and/or Wo Contrast  Result Date: 04/25/2021 CLINICAL DATA:  40 year old female with recent cervical spine surgery. Drainage from the incision site. Postoperative pain and swelling. Possible sepsis. EXAM: CT ANGIOGRAPHY CHEST WITH CONTRAST TECHNIQUE: Multidetector CT imaging of the chest was performed using the standard protocol during bolus administration of intravenous contrast. Multiplanar CT image reconstructions and MIPs were obtained to evaluate the vascular anatomy. CONTRAST:  78m OMNIPAQUE IOHEXOL 350 MG/ML SOLN COMPARISON:  Neck CT today reported separately. Portable chest x-ray 0403 hours. FINDINGS: Cardiovascular: Good contrast bolus timing in the pulmonary arterial tree. Mild respiratory motion. No focal filling defect identified in the pulmonary arteries to suggest acute pulmonary embolism. Borderline to mild cardiomegaly. No pericardial effusion. Negative visible aorta. No calcified coronary artery atherosclerosis is evident. Mediastinum/Nodes: Roughly 2 cm area of subcutaneous soft tissue stranding overlying the left strap muscle and thyroid (series 6, image 7). No regional soft tissue gas. No fluid  collection. Thyroid parenchyma remains within normal limits. Negative mediastinum.  No lymphadenopathy. Lungs/Pleura: Somewhat low lung volumes with atelectatic changes to the major airways which remain patent. Mild perihilar and dependent atelectasis in both lungs, which otherwise appear negative. No pleural effusion or consolidation. Upper Abdomen: Negative visible liver, spleen, pancreas, adrenal glands, bowel and renal upper poles in the upper abdomen. Musculoskeletal: Partially visible lower cervical ACDF, see neck CT reported separately. No acute osseous abnormality identified. In the chest. Review of the MIP images confirms the above findings. IMPRESSION: 1. Negative for acute pulmonary embolus. 2. Approximately 2 cm area of subcutaneous inflammation at the left thoracic inlet overlying the left strap muscle and thyroid, likely at the incision site. But no fluid collection or other complicating features. Neck CT today reported separately. 3. Mild pulmonary atelectasis.  Borderline to mild cardiomegaly. Electronically Signed   By: HGenevie AnnM.D.   On: 04/25/2021 06:48   DG Chest Port 1 View  Result Date: 04/25/2021 CLINICAL DATA:  40year old female with possible sepsis. Syncope. Recent cervical spine surgery. EXAM: PORTABLE CHEST 1 VIEW COMPARISON:  Chest  radiographs 03/19/2017. FINDINGS: Portable AP semi upright view at 0403 hours. Lordotic positioning. Low lung volumes. Mediastinal contours remain within normal limits. Mild lung base atelectasis suspected, but otherwise Allowing for portable technique the lungs are clear. No pneumothorax or pleural effusion. Faintly visible cervical ACDF hardware. IMPRESSION: Lordotic positioning and low lung volumes with atelectasis. Electronically Signed   By: Genevie Ann M.D.   On: 04/25/2021 04:40   DG C-Arm 1-60 Min-No Report  Result Date: 04/16/2021 Fluoroscopy was utilized by the requesting physician.  No radiographic interpretation.   DG C-Arm 1-60 Min-No  Report  Result Date: 04/16/2021 CLINICAL DATA:  C5-6 anterior fusion. EXAM: CERVICAL SPINE - 2-3 VIEW; DG C-ARM 1-60 MIN-NO REPORT COMPARISON:  June 05, 2020. FINDINGS: Two intraoperative fluoroscopic images were obtained of the cervical spine. These images demonstrate surgical localization of the C5-6 disc space. IMPRESSION: Fluoroscopic guidance provided during cervical spine surgery. Electronically Signed   By: Marijo Conception M.D.   On: 04/16/2021 14:58   XR Cervical Spine 2 or 3 views  Result Date: 04/22/2021 AP lateral cervical spine images were obtained and reviewed this shows two-level cervical fusion C5-6 C6-7 with allograft and plate.  Good position and alignment.  Minimal prevertebral swelling. Impression: Satisfactory two-level cervical fusion C5-C7.   Discharge Instructions     Incentive spirometry RT   Complete by: As directed         Follow-up Information     Marybelle Killings, MD. Schedule an appointment as soon as possible for a visit in 1 week(s).   Specialty: Orthopedic Surgery Why: need return office visit with Dr Lorin Mercy one week postop. Contact information: [address] [place] [address] [phone]                 Discharge Plan:  discharge to home  Disposition:     Signed: Benjiman Core  05/07/2021, 7:41 AM

## 2021-05-09 ENCOUNTER — Ambulatory Visit (INDEPENDENT_AMBULATORY_CARE_PROVIDER_SITE_OTHER): Payer: BC Managed Care – PPO | Admitting: Orthopaedic Surgery

## 2021-05-09 ENCOUNTER — Encounter: Payer: Self-pay | Admitting: Orthopaedic Surgery

## 2021-05-09 ENCOUNTER — Other Ambulatory Visit: Payer: Self-pay

## 2021-05-09 VITALS — BP 134/92 | HR 56 | Ht 66.0 in | Wt 263.0 lb

## 2021-05-09 DIAGNOSIS — Z981 Arthrodesis status: Secondary | ICD-10-CM
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
## 2021-05-15 ENCOUNTER — Ambulatory Visit: Payer: BC Managed Care – PPO | Admitting: Dermatology

## 2021-05-19 NOTE — Progress Notes (Deleted)
TRANSITION OF CARE VISIT   Primary Care Provider (PCP):   Durene Fruits, NP                                                     [facility]       [address] [place]             [place],  [place]  [address]             Phone: [phone]            Fax: [phone]     Date of Admission: 04/16/2021  Date of Discharge: 04/17/2021  Transitions of Care Call: 04/18/2021  Discharged from: [facility]   Discharge Diagnosis:  Principal Problem:   Cervical spinal stenosis  status post Procedure(s): ANTERIOR CERVICAL DISCECTOMY FUSION CERVICAL FIVE - CERVICAL SIX, CERVICAL SIX -CERVICAL SEVEN, ALLOGRAFT, PLATE  Summary of Admission per MD note:  Hospital Course: Carmen [name] is an 41 y.o. female who was admitted 04/16/2021 for operative treatment of cervical ddd. Patient failed conservative treatments (please see the history and physical for the specifics) and had severe unremitting pain that affects sleep, daily activities and work/hobbies. After pre-op clearance, the patient was taken to the operating room on 04/16/2021 and underwent  Procedure(s): ANTERIOR CERVICAL DISCECTOMY FUSION CERVICAL FIVE - CERVICAL SIX, CERVICAL SIX -CERVICAL SEVEN, ALLOGRAFT, PLATE.    Patient was given sequential compression devices and early ambulation to prevent DVT.    Patient benefited maximally from hospital stay and there were no complications. At the time of discharge, the patient was urinating/moving their bowels without difficulty, tolerating a regular diet, pain is controlled with oral pain medications and they have been cleared by PT/OT.    Follow-Ups: Schedule an appointment with Marybelle Killings, MD (Orthopedic Surgery) in 1 week (04/24/2021); need return office visit with Dr Lorin Mercy one week postop.  TODAY's VISIT Pain  Still seeing ortho  Leg compressions   Patient/Caregiver self-reported  problems/concerns: ***  MEDICATIONS  Medication Reconciliation conducted with patient/caregiver? (Yes/ No): Yes  New medications prescribed/discontinued upon discharge? (Yes/No): Yes  Barriers identified related to medications: No  LABS  Lab Reviewed (Yes/No/NA): Yes  PHYSICAL EXAM:    ASSESSMENT:   PATIENT EDUCATION PROVIDED: See AVS   FOLLOW-UP (Include any further testing or referrals): ***

## 2021-05-26 ENCOUNTER — Encounter: Payer: BC Managed Care – PPO | Admitting: Family

## 2021-05-26 ENCOUNTER — Ambulatory Visit: Payer: BC Managed Care – PPO | Admitting: Family

## 2021-05-26 ENCOUNTER — Other Ambulatory Visit: Payer: Self-pay

## 2021-05-26 NOTE — Progress Notes (Signed)
Erroneous encounter

## 2021-05-28 ENCOUNTER — Telehealth: Payer: Self-pay | Admitting: Orthopaedic Surgery

## 2021-05-28 NOTE — Telephone Encounter (Signed)
Please advise 

## 2021-05-28 NOTE — Telephone Encounter (Signed)
Patient called advised she took the brace off to  take a shower and put it back on.  Patient asked if she can leave the brace off or does she need to keep it on?  The number to contact patient is [phone]

## 2021-05-30 ENCOUNTER — Ambulatory Visit (INDEPENDENT_AMBULATORY_CARE_PROVIDER_SITE_OTHER): Payer: BC Managed Care – PPO | Admitting: Orthopaedic Surgery

## 2021-05-30 ENCOUNTER — Ambulatory Visit (INDEPENDENT_AMBULATORY_CARE_PROVIDER_SITE_OTHER): Payer: BC Managed Care – PPO

## 2021-05-30 ENCOUNTER — Encounter: Payer: Self-pay | Admitting: Orthopaedic Surgery

## 2021-05-30 ENCOUNTER — Other Ambulatory Visit: Payer: Self-pay

## 2021-05-30 VITALS — BP 141/80 | HR 67 | Ht 66.0 in | Wt 263.0 lb

## 2021-05-30 DIAGNOSIS — Z981 Arthrodesis status: Secondary | ICD-10-CM
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
## 2021-06-06 ENCOUNTER — Telehealth: Payer: Self-pay | Admitting: Radiology

## 2021-06-06 NOTE — Telephone Encounter (Signed)
Patient called and requests note ok for work with no restrictions to be faxed to [phone]. OK per Dr. Lorin Mercy.  Note entered and faxed.

## 2021-07-15 ENCOUNTER — Other Ambulatory Visit: Payer: Self-pay

## 2021-07-15 ENCOUNTER — Encounter: Payer: Self-pay | Admitting: Orthopaedic Surgery

## 2021-07-15 ENCOUNTER — Ambulatory Visit: Payer: Self-pay

## 2021-07-15 ENCOUNTER — Ambulatory Visit: Payer: BC Managed Care – PPO | Admitting: Orthopaedic Surgery

## 2021-07-15 VITALS — BP 115/79 | HR 76 | Ht 66.0 in | Wt 268.0 lb

## 2021-07-15 DIAGNOSIS — M542 Cervicalgia: Secondary | ICD-10-CM

## 2021-07-15 DIAGNOSIS — Z981 Arthrodesis status: Secondary | ICD-10-CM
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
## 2021-07-30 ENCOUNTER — Other Ambulatory Visit: Payer: Self-pay | Admitting: Family

## 2021-07-30 DIAGNOSIS — E119 Type 2 diabetes mellitus without complications: Secondary | ICD-10-CM

## 2021-10-22 ENCOUNTER — Other Ambulatory Visit: Payer: Self-pay | Admitting: Family Medicine

## 2021-10-22 ENCOUNTER — Ambulatory Visit: Payer: Self-pay

## 2021-10-22 DIAGNOSIS — M25571 Pain in right ankle and joints of right foot: Secondary | ICD-10-CM

## 2021-10-22 DIAGNOSIS — M79661 Pain in right lower leg: Secondary | ICD-10-CM
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
## 2022-04-20 ENCOUNTER — Telehealth: Payer: Self-pay | Admitting: Orthopaedic Surgery
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
## 2022-05-21 ENCOUNTER — Telehealth: Payer: Self-pay | Admitting: Orthopaedic Surgery
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
## 2022-05-26 ENCOUNTER — Telehealth: Payer: Self-pay | Admitting: Orthopaedic Surgery

## 2022-05-26 NOTE — Telephone Encounter (Signed)
Patient states she was speaking to Dr. Lorin Mercy and she had another Question. Please advise.Marland Kitchen

## 2022-05-26 NOTE — Telephone Encounter (Signed)
noted 

## 2022-05-26 NOTE — Telephone Encounter (Signed)
Patient requesting return call from you for another question.

## 2023-03-03 ENCOUNTER — Emergency Department (HOSPITAL_COMMUNITY): Payer: BC Managed Care – PPO

## 2023-03-03 ENCOUNTER — Encounter (HOSPITAL_COMMUNITY): Payer: Self-pay

## 2023-03-03 ENCOUNTER — Emergency Department (HOSPITAL_COMMUNITY)
Admission: EM | Admit: 2023-03-03 | Discharge: 2023-03-03 | Discharge status: Against Medical Advice | Payer: BC Managed Care – PPO | Attending: Emergency Medicine | Admitting: Emergency Medicine

## 2023-03-03 ENCOUNTER — Other Ambulatory Visit: Payer: Self-pay

## 2023-03-03 DIAGNOSIS — I10 Essential (primary) hypertension: Secondary | ICD-10-CM | POA: Diagnosis not present

## 2023-03-03 DIAGNOSIS — R519 Headache, unspecified: Secondary | ICD-10-CM | POA: Diagnosis present

## 2023-03-03 DIAGNOSIS — R112 Nausea with vomiting, unspecified: Secondary | ICD-10-CM | POA: Diagnosis not present

## 2023-03-03 DIAGNOSIS — Z79899 Other long term (current) drug therapy: Secondary | ICD-10-CM | POA: Insufficient documentation

## 2023-03-03 DIAGNOSIS — Z5329 Procedure and treatment not carried out because of patient's decision for other reasons: Secondary | ICD-10-CM | POA: Diagnosis not present

## 2023-03-03 LAB — CBC WITH DIFFERENTIAL/PLATELET
Abs Immature Granulocytes: 0.01 10*3/uL (ref 0.00–0.07)
Basophils Absolute: 0 10*3/uL (ref 0.0–0.1)
Basophils Relative: 1 %
Eosinophils Absolute: 0.4 10*3/uL (ref 0.0–0.5)
Eosinophils Relative: 7 %
HCT: 43.8 % (ref 36.0–46.0)
Hemoglobin: 14.1 g/dL (ref 12.0–15.0)
Immature Granulocytes: 0 %
Lymphocytes Relative: 41 %
Lymphs Abs: 2.4 10*3/uL (ref 0.7–4.0)
MCH: 27 pg (ref 26.0–34.0)
MCHC: 32.2 g/dL (ref 30.0–36.0)
MCV: 83.9 fL (ref 80.0–100.0)
Monocytes Absolute: 0.5 10*3/uL (ref 0.1–1.0)
Monocytes Relative: 9 %
Neutro Abs: 2.5 10*3/uL (ref 1.7–7.7)
Neutrophils Relative %: 42 %
Platelets: 358 10*3/uL (ref 150–400)
RBC: 5.22 MIL/uL — ABNORMAL HIGH (ref 3.87–5.11)
RDW: 12.5 % (ref 11.5–15.5)
WBC: 5.8 10*3/uL (ref 4.0–10.5)
nRBC: 0 % (ref 0.0–0.2)

## 2023-03-03 LAB — BASIC METABOLIC PANEL
Anion gap: 7 (ref 5–15)
BUN: 8 mg/dL (ref 6–20)
CO2: 28 mmol/L (ref 22–32)
Calcium: 9.4 mg/dL (ref 8.9–10.3)
Chloride: 102 mmol/L (ref 98–111)
Creatinine, Ser: 0.67 mg/dL (ref 0.44–1.00)
GFR, Estimated: >60 mL/min (ref >60)
Glucose, Bld: 89 mg/dL (ref 70–99)
Potassium: 3.7 mmol/L (ref 3.5–5.1)
Sodium: 137 mmol/L (ref 135–145)

## 2023-03-03 MED ORDER — DIPHENHYDRAMINE HCL 50 MG/ML IJ SOLN
12.5000 mg | Freq: Once | INTRAMUSCULAR | Status: AC
  Administered 2023-03-03: 12.5 mg via INTRAVENOUS
  Filled 2023-03-03: qty 1

## 2023-03-03 MED ORDER — PROCHLORPERAZINE EDISYLATE 10 MG/2ML IJ SOLN
10.0000 mg | Freq: Once | INTRAMUSCULAR | Status: AC
  Administered 2023-03-03: 10 mg via INTRAVENOUS
  Filled 2023-03-03: qty 2

## 2023-03-03 MED ORDER — ONDANSETRON 4 MG PO TBDP
4.0000 mg | ORAL_TABLET | Freq: Once | ORAL | Status: AC
Start: 2023-03-03 — End: 2023-03-03
  Administered 2023-03-03: 4 mg via ORAL
  Filled 2023-03-03: qty 1

## 2023-03-03 NOTE — ED Provider Notes (Signed)
EMERGENCY DEPARTMENT AT [facility] Provider Note   CSN: [id number] Arrival date & time: 03/03/23  1223    History  Chief Complaint  Patient presents with   Headache   Hypertension    Carmen [name] is a 42 y.o. female here for evaluation of headache and elevated blood pressure.  She has history of migraines.  States headache today feels different than her typical migraine.  Tried taking Tylenol without relief.  Initially started at the posterior aspect of her head and then radiated to the front of her head and had pressure behind bilateral eyes.  She took her blood pressure which is greater than 200 systolic.  She has history of hypertension and is compliant with her meds.  She has had some nausea that vomiting.  She denies any sudden onset thunderclap headache, neck pain, fever, vomiting, chest pain, back pain, numbness or weakness.  No pain with eye movement.  No recent trauma or injuries.  HPI     Home Medications Prior to Admission medications   Medication Sig Start Date End Date Taking? Authorizing Provider  albuterol (VENTOLIN HFA) 108 (90 Base) MCG/ACT inhaler Inhale 2 puffs into the lungs every 4 (four) hours as needed for wheezing or shortness of breath. 04/29/20   [provider]  amLODipine (NORVASC) 5 MG tablet Take 1 tablet (5 mg total) by mouth daily. 02/24/21   Ivonne Andrew, NP  Blood Glucose Monitoring Suppl (TRUE METRIX GO GLUCOSE METER) w/Device KIT USE AS DIRECTED 09/06/20   Rema Fendt, NP  celecoxib (CELEBREX) 200 MG capsule Take 200 mg by mouth daily. 07/02/21   [provider]  cetirizine (ZYRTEC) 10 MG tablet Take 1 tablet (10 mg total) by mouth daily. Patient taking differently: Take 10 mg by mouth daily as needed for allergies. 08/04/18   Bing Neighbors, NP  clobetasol (OLUX) 0.05 % topical foam Apply topically 2 (two) times daily. 04/16/21   Janalyn Harder, MD  Continuous Blood Gluc Receiver (DEXCOM G6  RECEIVER) DEVI 1 Device by Does not apply route 4 (four) times daily -  before meals and at bedtime. 06/14/20   Rema Fendt, NP  Continuous Blood Gluc Sensor (DEXCOM G6 SENSOR) MISC N/A 11/14/20   Rema Fendt, NP  Continuous Blood Gluc Transmit (DEXCOM G6 TRANSMITTER) MISC 1 DEVICE BY DOES NOT APPLY ROUTE 4 TIMES DAILY- BEFORE MEALS AND AT BEDTIME 01/24/21   Zonia Kief, Amy J, NP  FARXIGA 5 MG TABS tablet TAKE 1 TABLET BY MOUTH EVERY DAY BEFORE BREAKFAST 03/20/21   Georganna Skeans, MD  fluconazole (DIFLUCAN) 150 MG tablet Take by mouth. 05/28/21   [provider]  hydrOXYzine (ATARAX/VISTARIL) 50 MG tablet Take 25-50 mg by mouth at bedtime as needed. 03/26/21   [provider]  ketoconazole (NIZORAL) 2 % shampoo 1 application 2 (two) times a week. 11/02/19   [provider]  levonorgestrel (MIRENA) 20 MCG/24HR IUD 1 each by Intrauterine route once. Implanted December 2014    [provider]  losartan-hydrochlorothiazide (HYZAAR) 50-12.5 MG tablet Take 1 tablet by mouth daily. 12/05/20   Rema Fendt, NP  methocarbamol (ROBAXIN) 500 MG tablet Take 1 tablet (500 mg total) by mouth every 6 (six) hours as needed for muscle spasms. Patient not taking: Reported on 07/15/2021 04/17/21   Eldred Manges, MD  metroNIDAZOLE (METROGEL) 0.75 % vaginal gel Place vaginally at bedtime. 05/28/21   [provider]  Multiple Vitamin (MULTIVITAMIN WITH MINERALS) TABS tablet  Take 1 tablet by mouth daily. Woman's    [provider]  omeprazole (PRILOSEC) 20 MG capsule TAKE 1 CAPSULE BY MOUTH EVERY DAY 08/21/20   Rema Fendt, NP  ondansetron (ZOFRAN ODT) 4 MG disintegrating tablet Take 1 tablet (4 mg total) by mouth every 8 (eight) hours as needed for nausea or vomiting. 02/24/21   Ivonne Andrew, NP  oxyCODONE-acetaminophen (PERCOCET/ROXICET) 5-325 MG tablet Take 1 tablet by mouth every 4 (four) hours as needed for severe pain. Patient not taking: Reported on 07/15/2021  04/17/21   Eldred Manges, MD  OZEMPIC, 0.25 OR 0.5 MG/DOSE, 2 MG/1.5ML SOPN SMARTSIG:0.25 Milligram(s) SUB-Q Once a Week 06/27/21   [provider]  TRUE METRIX BLOOD GLUCOSE TEST test strip USE AS INSTRUCTED 07/30/21   Georganna Skeans, MD  TRUEplus Lancets 28G MISC Use as directed 06/10/20   Rema Fendt, NP  valACYclovir (VALTREX) 500 MG tablet TAKE 1 TABLET (500 MG) BY MOUTH TWICE DAILY FOR 5 DAYS AS NEEDED FOR OUTBREAKS Patient taking differently: Take 500 mg by mouth daily as needed (Cold sore). 03/27/21   Brock Bad, MD  dicyclomine (BENTYL) 20 MG tablet Take 1 tablet (20 mg total) by mouth 2 (two) times daily. 07/20/18 12/12/18  Belinda Fisher, PA-C  diltiazem (CARDIZEM CD) 120 MG 24 hr capsule Take 1 capsule (120 mg total) by mouth daily. 08/04/18 12/12/18  Bing Neighbors, NP      Allergies    Lisinopril, Molds & smuts, and Other    Review of Systems   Review of Systems  Constitutional: Negative.   HENT: Negative.    Respiratory: Negative.    Cardiovascular: Negative.   Gastrointestinal:  Positive for nausea. Negative for abdominal pain, diarrhea and vomiting.  Genitourinary: Negative.   Musculoskeletal:  Negative for neck pain and neck stiffness.  Skin: Negative.   Neurological:  Positive for headaches. Negative for dizziness, tremors, seizures, syncope, facial asymmetry, speech difficulty, weakness, light-headedness and numbness.  All other systems reviewed and are negative.   Physical Exam Updated Vital Signs BP 134/73   Pulse (!) 59   Temp 97.6 F (36.4 C)   Resp 17   Ht 5\' 6"  (1.676 m)   Wt 126.6 kg   SpO2 100%   BMI 45.03 kg/m  Physical Exam Physical Exam  Constitutional: Pt is oriented to person, place, and time. Pt appears well-developed and well-nourished. No distress.  HENT:  Head: Normocephalic and atraumatic.  Mouth/Throat: Oropharynx is clear and moist.  Eyes: Conjunctivae and EOM are normal. Pupils are equal, round, and reactive to light. No  scleral icterus.  No horizontal, vertical or rotational nystagmus  Neck: Normal range of motion. Neck supple.  Full active and passive ROM without pain No midline or paraspinal tenderness No nuchal rigidity or meningeal signs  Cardiovascular: Normal rate, regular rhythm and intact distal pulses.   Pulmonary/Chest: Effort normal and breath sounds normal. No respiratory distress. Pt has no wheezes. No rales.  Abdominal: Soft. Bowel sounds are normal. There is no tenderness. There is no rebound and no guarding.  Musculoskeletal: Normal range of motion.  Lymphadenopathy:    No cervical adenopathy.  Neurological: Pt. is alert and oriented to person, place, and time. He has normal reflexes. No cranial nerve deficit.  Exhibits normal muscle tone. Coordination normal.  Mental Status:  Alert, oriented, thought content appropriate. Speech fluent without evidence of aphasia. Able to follow 2 step commands without difficulty.  Cranial Nerves: 2-12 grossly intact Motor:  Equal strength BL Intact sensation BL Cerebellar: normal F2N Gait: normal gait and balance CV: distal pulses palpable throughout   Skin: Skin is warm and dry. No rash noted. Pt is not diaphoretic.  Psychiatric: Pt has a normal mood and affect. Behavior is normal. Judgment and thought content normal.  Nursing note and vitals reviewed.  ED Results / Procedures / Treatments   Labs (all labs ordered are listed, but only abnormal results are displayed) Labs Reviewed  CBC WITH DIFFERENTIAL/PLATELET - Abnormal; Notable for the following components:      Result Value   RBC 5.22 (*)    All other components within normal limits  BASIC METABOLIC PANEL  HCG, SERUM, QUALITATIVE    EKG None  Radiology CT HEAD WO CONTRAST ( )  Result Date: 03/03/2023 CLINICAL DATA:  Headache. EXAM: CT HEAD WITHOUT CONTRAST TECHNIQUE: Contiguous axial images were obtained from the base of the skull through the vertex without intravenous contrast.  RADIATION DOSE REDUCTION: This exam was performed according to the departmental dose-optimization program which includes automated exposure control, adjustment of the mA and/or kV according to patient size and/or use of iterative reconstruction technique. COMPARISON:  February 25, 2021 FINDINGS: Brain: No evidence of acute infarction, hemorrhage, hydrocephalus, extra-axial collection or mass lesion/mass effect. Vascular: No hyperdense vessel or unexpected calcification. Skull: Normal. Negative for fracture or focal lesion. Sinuses/Orbits: There is mild to moderate severity bilateral ethmoid sinus mucosal thickening. Other: None. IMPRESSION: 1. No acute intracranial pathology. 2. Mild to moderate severity bilateral ethmoid sinus disease. Electronically Signed   By: Aram Candela M.D.   On: 03/03/2023 20:40    Procedures Procedures    Medications Ordered in ED Medications  ondansetron (ZOFRAN-ODT) disintegrating tablet 4 mg (4 mg Oral Given 03/03/23 1335)  prochlorperazine (COMPAZINE) injection 10 mg (10 mg Intravenous Given 03/03/23 1642)  diphenhydrAMINE (BENADRYL) injection 12.5 mg (12.5 mg Intravenous Given 03/03/23 1642)    ED Course/ Medical Decision Making/ A&P   42 year old history of migraines, hypertension here for evaluation of headache and elevated blood pressure.  Just after waking up this morning she developed headache which started the back of her head and went to the front of her head and had pressure behind her eyes.  She took Tylenol.  She subsequently took her blood pressure which was significantly elevated with systolic greater than 200.  She denies any sudden onset" headache.  No numbness, weakness, vision changes.  No fever, neck pain.  No URI symptoms.  Here in ED her blood pressure has significantly improved without any intervention here or with EMS.  Systolic 134.  She has a nonfocal neuroexam without deficits.  She does admit to continued headache.  We had shared decision  making for labs, imaging. Request labs, imaging. Will also give migraine cocktail.  Labs and imaging personally viewed and interpreted:  CBC without leukocytosis BMP without significant abnormality Preg-- patient left AMA prior to getting this lab CT head pending EKG  Patient reassessed.  Improved she is pending imaging.  Nursing let me know that patient states she has to leave.  She has got her CT head.  She will be leaving AGAINST MEDICAL ADVICE as this has not been read by radiology.  Will have her follow-up if her symptoms worsen  We discussed the nature and purpose, risks and benefits, as well as, the alternatives of treatment. Time was given to allow the opportunity to ask questions and consider their options, and after the discussion, the patient decided to refuse the  offerred treatment. The patient was informed that refusal could lead to, but was not limited to, death, permanent disability, or severe pain. If present, I asked the relatives or significant others to dissuade them without success. Prior to refusing, I determined that the patient had the capacity to make their decision and understood the consequences of that decision. After refusal, I made every reasonable opportunity to treat them to the best of my ability.  The patient was notified that they may return to the emergency department at any time for further treatment.    ADDEND: CT head wo acute abnormality                                 Medical Decision Making Amount and/or Complexity of Data Reviewed External Data Reviewed: labs, radiology, ECG and notes. Labs: ordered. Decision-making details documented in ED Course. Radiology: ordered and independent interpretation performed. Decision-making details documented in ED Course. ECG/medicine tests: ordered and independent interpretation performed. Decision-making details documented in ED Course.  Risk OTC drugs. Prescription drug management. Parenteral controlled  substances. Decision regarding hospitalization. Diagnosis or treatment significantly limited by social determinants of health.          Final Clinical Impression(s) / ED Diagnoses Final diagnoses:  Bad headache    Rx / DC Orders ED Discharge Orders     None         Saanvika Vazques A, PA-C 03/03/23 2054    Melene Plan, DO 03/03/23 2056

## 2023-03-03 NOTE — ED Triage Notes (Signed)
Pt BIB [facility] from her work d/t a HA that started at 0900 this AM that is felt behind both of her eyes, radiates up to the center of her head & then down the Lt side of her head. Pt reports Hx of migraines but this does not feel the same. Also has Hx of HTN & her initial SBP was 210 for EMS, has came down to 129/103 before arrival. Denies any missed doses of her meds at home & her 7/10 HA pain has stayed the same with her decrease in BP. No neurological deficit or vision changes, 12 L good. Pulse ranged from 55-65 bpm, A/Ox4.

## 2023-03-03 NOTE — ED Provider Triage Note (Cosign Needed)
Emergency Medicine Provider Triage Evaluation Note  Carmen [name] , a 42 y.o. female  was evaluated in triage.  Pt complains of headache and hypertension.  Patient reports that she had elevated blood pressure readings earlier today and has been having headache.  History of migraine headaches.  Feels that this headache is somewhat different than typical migraines.  Has tried taking Tylenol without significant improvement.  Endorsing some nausea but denies any vomiting.  Review of Systems  Positive: As above Negative: As above  Physical Exam  BP 134/73   Pulse (!) 59   Temp 97.6 F (36.4 C)   Resp 17   Ht 5\' 6"  (1.676 m)   Wt 126.6 kg   SpO2 100%   BMI 45.03 kg/m  Gen:   Awake, no distress   Resp:  Normal effort MSK:   Moves extremities without difficulty  Other:  No focal neurological deficits, no unilateral weakness or numbness, no vision changes  Medical Decision Making  Medically screening exam initiated at 1:24 PM.  Appropriate orders placed.  Carmen [name] was informed that the remainder of the evaluation will be completed by another provider, this initial triage assessment does not replace that evaluation, and the importance of remaining in the ED until their evaluation is complete.     Smitty Knudsen, PA-C 03/03/23 1324

## 2023-03-03 NOTE — ED Notes (Signed)
Benefits and risk  of leaving AMA explained to patient and patient verbalized understanding of risk of death worsening condition.

## 2023-03-03 NOTE — Discharge Instructions (Signed)
You needed to leave prior to CT imaging resulting.   Please return for new or worsening symptoms

## 2023-07-13 ENCOUNTER — Emergency Department (HOSPITAL_BASED_OUTPATIENT_CLINIC_OR_DEPARTMENT_OTHER)
Admission: EM | Admit: 2023-07-13 | Discharge: 2023-07-13 | Disposition: A | Discharge status: Home / Self Care | Payer: 59 | Attending: Emergency Medicine | Admitting: Emergency Medicine

## 2023-07-13 ENCOUNTER — Other Ambulatory Visit: Payer: Self-pay

## 2023-07-13 ENCOUNTER — Emergency Department (HOSPITAL_BASED_OUTPATIENT_CLINIC_OR_DEPARTMENT_OTHER): Payer: 59

## 2023-07-13 ENCOUNTER — Encounter (HOSPITAL_BASED_OUTPATIENT_CLINIC_OR_DEPARTMENT_OTHER): Payer: Self-pay

## 2023-07-13 DIAGNOSIS — H538 Other visual disturbances: Secondary | ICD-10-CM | POA: Diagnosis not present

## 2023-07-13 DIAGNOSIS — E119 Type 2 diabetes mellitus without complications: Secondary | ICD-10-CM | POA: Diagnosis not present

## 2023-07-13 DIAGNOSIS — I10 Essential (primary) hypertension: Secondary | ICD-10-CM | POA: Insufficient documentation

## 2023-07-13 DIAGNOSIS — R519 Headache, unspecified: Secondary | ICD-10-CM | POA: Diagnosis present

## 2023-07-13 DIAGNOSIS — R200 Anesthesia of skin: Secondary | ICD-10-CM | POA: Insufficient documentation

## 2023-07-13 DIAGNOSIS — Z79899 Other long term (current) drug therapy: Secondary | ICD-10-CM | POA: Diagnosis not present

## 2023-07-13 LAB — CBC WITH DIFFERENTIAL/PLATELET
Abs Immature Granulocytes: 0.01 10*3/uL (ref 0.00–0.07)
Basophils Absolute: 0.1 10*3/uL (ref 0.0–0.1)
Basophils Relative: 1 %
Eosinophils Absolute: 0.3 10*3/uL (ref 0.0–0.5)
Eosinophils Relative: 7 %
HCT: 40.6 % (ref 36.0–46.0)
Hemoglobin: 13.2 g/dL (ref 12.0–15.0)
Immature Granulocytes: 0 %
Lymphocytes Relative: 47 %
Lymphs Abs: 2.4 10*3/uL (ref 0.7–4.0)
MCH: 27.3 pg (ref 26.0–34.0)
MCHC: 32.5 g/dL (ref 30.0–36.0)
MCV: 83.9 fL (ref 80.0–100.0)
Monocytes Absolute: 0.3 10*3/uL (ref 0.1–1.0)
Monocytes Relative: 6 %
Neutro Abs: 2 10*3/uL (ref 1.7–7.7)
Neutrophils Relative %: 39 %
Platelets: 320 10*3/uL (ref 150–400)
RBC: 4.84 MIL/uL (ref 3.87–5.11)
RDW: 12.9 % (ref 11.5–15.5)
WBC: 5.1 10*3/uL (ref 4.0–10.5)
nRBC: 0 % (ref 0.0–0.2)

## 2023-07-13 LAB — BASIC METABOLIC PANEL
Anion gap: 6 (ref 5–15)
BUN: 11 mg/dL (ref 6–20)
CO2: 23 mmol/L (ref 22–32)
Calcium: 8.1 mg/dL — ABNORMAL LOW (ref 8.9–10.3)
Chloride: 108 mmol/L (ref 98–111)
Creatinine, Ser: 0.71 mg/dL (ref 0.44–1.00)
GFR, Estimated: >60 mL/min (ref >60)
Glucose, Bld: 92 mg/dL (ref 70–99)
Potassium: 3.8 mmol/L (ref 3.5–5.1)
Sodium: 137 mmol/L (ref 135–145)

## 2023-07-13 LAB — PREGNANCY, URINE: Preg Test, Ur: NEGATIVE

## 2023-07-13 MED ORDER — METOCLOPRAMIDE HCL 5 MG/ML IJ SOLN
10.0000 mg | Freq: Once | INTRAMUSCULAR | Status: AC
  Administered 2023-07-13: 10 mg via INTRAVENOUS
  Filled 2023-07-13: qty 2

## 2023-07-13 MED ORDER — RIZATRIPTAN BENZOATE 10 MG PO TBDP: 10.0000 mg | ORAL_TABLET | ORAL | 0 refills | Status: AC | PRN

## 2023-07-13 MED ORDER — ONDANSETRON 4 MG PO TBDP: 4.0000 mg | ORAL_TABLET | Freq: Three times a day (TID) | ORAL | 0 refills | Status: AC | PRN

## 2023-07-13 MED ORDER — SODIUM CHLORIDE 0.9 % IV BOLUS
1000.0000 mL | Freq: Once | INTRAVENOUS | Status: AC
  Administered 2023-07-13: 1000 mL via INTRAVENOUS

## 2023-07-13 MED ORDER — KETOROLAC TROMETHAMINE 15 MG/ML IJ SOLN
15.0000 mg | Freq: Once | INTRAMUSCULAR | Status: AC
  Administered 2023-07-13: 15 mg via INTRAVENOUS
  Filled 2023-07-13: qty 1

## 2023-07-13 MED ORDER — DIPHENHYDRAMINE HCL 50 MG/ML IJ SOLN
12.5000 mg | Freq: Once | INTRAMUSCULAR | Status: AC
  Administered 2023-07-13: 12.5 mg via INTRAVENOUS
  Filled 2023-07-13: qty 1

## 2023-07-13 NOTE — ED Provider Notes (Signed)
 [place] [facility] Provider Note   CSN: [id number] Arrival date & time: 07/13/23  4010     History  Chief Complaint  Patient presents with   Headache    Carmen [name] is a 43 y.o. female.  Pt is a 43 yo female with pmhx significant for fibroids, migraines, IBS, obesity, htn, dm, and sleep apnea.  Pt said she's had an intermittent severe headache to the left side of her face.  She has been diagnosed with migraines, but has not seen neurology in years.  She was on imitrex and topamax, but said it did not help.  Pt said her severe pain lasts for about 10 seconds and then leaves some blurry vision and left facial numbness.  No other neuro sx.       Home Medications Prior to Admission medications   Medication Sig Start Date End Date Taking? Authorizing Provider  ondansetron (ZOFRAN-ODT) 4 MG disintegrating tablet Take 1 tablet (4 mg total) by mouth every 8 (eight) hours as needed. 07/13/23  Yes Jacalyn Lefevre, MD  rizatriptan (MAXALT-MLT) 10 MG disintegrating tablet Take 1 tablet (10 mg total) by mouth as needed for migraine. May repeat in 2 hours if needed 07/13/23  Yes Jacalyn Lefevre, MD  albuterol (VENTOLIN HFA) 108 (90 Base) MCG/ACT inhaler Inhale 2 puffs into the lungs every 4 (four) hours as needed for wheezing or shortness of breath. 04/29/20   [provider]  amLODipine (NORVASC) 5 MG tablet Take 1 tablet (5 mg total) by mouth daily. 02/24/21   Ivonne Andrew, NP  Blood Glucose Monitoring Suppl (TRUE METRIX GO GLUCOSE METER) w/Device KIT USE AS DIRECTED 09/06/20   Rema Fendt, NP  celecoxib (CELEBREX) 200 MG capsule Take 200 mg by mouth daily. 07/02/21   [provider]  cetirizine (ZYRTEC) 10 MG tablet Take 1 tablet (10 mg total) by mouth daily. Patient taking differently: Take 10 mg by mouth daily as needed for allergies. 08/04/18   Bing Neighbors, NP  clobetasol (OLUX) 0.05 % topical foam Apply topically 2  (two) times daily. 04/16/21   Janalyn Harder, MD  Continuous Blood Gluc Receiver (DEXCOM G6 RECEIVER) DEVI 1 Device by Does not apply route 4 (four) times daily -  before meals and at bedtime. 06/14/20   Rema Fendt, NP  Continuous Blood Gluc Sensor (DEXCOM G6 SENSOR) MISC N/A 11/14/20   Rema Fendt, NP  Continuous Blood Gluc Transmit (DEXCOM G6 TRANSMITTER) MISC 1 DEVICE BY DOES NOT APPLY ROUTE 4 TIMES DAILY- BEFORE MEALS AND AT BEDTIME 01/24/21   Zonia Kief, Amy J, NP  FARXIGA 5 MG TABS tablet TAKE 1 TABLET BY MOUTH EVERY DAY BEFORE BREAKFAST 03/20/21   Georganna Skeans, MD  fluconazole (DIFLUCAN) 150 MG tablet Take by mouth. 05/28/21   [provider]  hydrOXYzine (ATARAX/VISTARIL) 50 MG tablet Take 25-50 mg by mouth at bedtime as needed. 03/26/21   [provider]  ketoconazole (NIZORAL) 2 % shampoo 1 application 2 (two) times a week. 11/02/19   [provider]  levonorgestrel (MIRENA) 20 MCG/24HR IUD 1 each by Intrauterine route once. Implanted December 2014    [provider]  losartan-hydrochlorothiazide (HYZAAR) 50-12.5 MG tablet Take 1 tablet by mouth daily. 12/05/20   Rema Fendt, NP  methocarbamol (ROBAXIN) 500 MG tablet Take 1 tablet (500 mg total) by mouth every 6 (six) hours as needed for muscle spasms. Patient not taking: Reported on 07/15/2021 04/17/21   Annell Greening  C, MD  metroNIDAZOLE (METROGEL) 0.75 % vaginal gel Place vaginally at bedtime. 05/28/21   [provider]  Multiple Vitamin (MULTIVITAMIN WITH MINERALS) TABS tablet Take 1 tablet by mouth daily. Woman's    [provider]  omeprazole (PRILOSEC) 20 MG capsule TAKE 1 CAPSULE BY MOUTH EVERY DAY 08/21/20   Rema Fendt, NP  ondansetron (ZOFRAN ODT) 4 MG disintegrating tablet Take 1 tablet (4 mg total) by mouth every 8 (eight) hours as needed for nausea or vomiting. 02/24/21   Ivonne Andrew, NP  oxyCODONE-acetaminophen (PERCOCET/ROXICET) 5-325 MG tablet Take 1 tablet by  mouth every 4 (four) hours as needed for severe pain. Patient not taking: Reported on 07/15/2021 04/17/21   Eldred Manges, MD  OZEMPIC, 0.25 OR 0.5 MG/DOSE, 2 MG/1.5ML SOPN SMARTSIG:0.25 Milligram(s) SUB-Q Once a Week 06/27/21   [provider]  TRUE METRIX BLOOD GLUCOSE TEST test strip USE AS INSTRUCTED 07/30/21   Georganna Skeans, MD  TRUEplus Lancets 28G MISC Use as directed 06/10/20   Rema Fendt, NP  valACYclovir (VALTREX) 500 MG tablet TAKE 1 TABLET (500 MG) BY MOUTH TWICE DAILY FOR 5 DAYS AS NEEDED FOR OUTBREAKS Patient taking differently: Take 500 mg by mouth daily as needed (Cold sore). 03/27/21   Brock Bad, MD  dicyclomine (BENTYL) 20 MG tablet Take 1 tablet (20 mg total) by mouth 2 (two) times daily. 07/20/18 12/12/18  Belinda Fisher, PA-C  diltiazem (CARDIZEM CD) 120 MG 24 hr capsule Take 1 capsule (120 mg total) by mouth daily. 08/04/18 12/12/18  Bing Neighbors, NP      Allergies    Lisinopril, Molds & smuts, and Other    Review of Systems   Review of Systems  Eyes:  Positive for visual disturbance.  Neurological:  Positive for numbness and headaches.  All other systems reviewed and are negative.   Physical Exam Updated Vital Signs BP (!) 146/84   Pulse (!) 57   Temp 97.8 F (36.6 C) (Oral)   Resp 16   Ht 5\' 6"  (1.676 m)   Wt 124.7 kg   SpO2 100%   BMI 44.39 kg/m  Physical Exam Vitals and nursing note reviewed.  Constitutional:      Appearance: She is well-developed. She is obese.  HENT:     Head: Normocephalic and atraumatic.     Mouth/Throat:     Mouth: Mucous membranes are moist.     Pharynx: Oropharynx is clear.  Eyes:     Extraocular Movements: Extraocular movements intact.     Pupils: Pupils are equal, round, and reactive to light.  Cardiovascular:     Rate and Rhythm: Normal rate and regular rhythm.     Heart sounds: Normal heart sounds.  Pulmonary:     Effort: Pulmonary effort is normal.     Breath sounds: Normal breath sounds.   Abdominal:     General: Bowel sounds are normal.     Palpations: Abdomen is soft.  Musculoskeletal:        General: Normal range of motion.     Cervical back: Normal range of motion and neck supple.  Skin:    General: Skin is warm.  Neurological:     Mental Status: She is alert and oriented to person, place, and time.  Psychiatric:        Mood and Affect: Mood normal.        Speech: Speech normal.        Behavior: Behavior normal.  ED Results / Procedures / Treatments   Labs (all labs ordered are listed, but only abnormal results are displayed) Labs Reviewed  BASIC METABOLIC PANEL - Abnormal; Notable for the following components:      Result Value   Calcium 8.1 (*)    All other components within normal limits  PREGNANCY, URINE  CBC WITH DIFFERENTIAL/PLATELET    EKG None  Radiology CT Head Wo Contrast Result Date: 07/13/2023 CLINICAL DATA:  Stabbing headache 3 times a day for a week, episodic headaches associated with blurred vision, disorientation, dizziness. History of migraines. EXAM: CT HEAD WITHOUT CONTRAST TECHNIQUE: Contiguous axial images were obtained from the base of the skull through the vertex without intravenous contrast. RADIATION DOSE REDUCTION: This exam was performed according to the departmental dose-optimization program which includes automated exposure control, adjustment of the mA and/or kV according to patient size and/or use of iterative reconstruction technique. COMPARISON:  CT head 03/03/2023 FINDINGS: Brain: No acute intracranial hemorrhage. No CT evidence of acute infarct. No edema, mass effect, or midline shift. The basilar cisterns are patent. Ventricles: The ventricles are normal. Vascular: No hyperdense vessel or unexpected calcification. Skull: No acute or aggressive finding. Orbits: Orbits are symmetric. Sinuses: Similar appearance of mild to moderate scattered mucosal thickening in the ethmoid sinuses. Mucosal thickening extends into the left  frontal sinus. Additional mucosal thickening and secretions in the sphenoid sinuses. Other: Mastoid air cells are clear. IMPRESSION: No CT evidence of acute intracranial abnormality. Similar appearance of paranasal sinus disease. Electronically Signed   By: Emily Filbert M.D.   On: 07/13/2023 12:56    Procedures Procedures    Medications Ordered in ED Medications  metoCLOPramide (REGLAN) injection 10 mg (10 mg Intravenous Given 07/13/23 1108)  diphenhydrAMINE (BENADRYL) injection 12.5 mg (12.5 mg Intravenous Given 07/13/23 1105)  ketorolac (TORADOL) 15 MG/ML injection 15 mg (15 mg Intravenous Given 07/13/23 1106)  sodium chloride 0.9 % bolus 1,000 mL (1,000 mLs Intravenous New Bag/Given 07/13/23 1100)    ED Course/ Medical Decision Making/ A&P                                 Medical Decision Making Amount and/or Complexity of Data Reviewed Labs: ordered. Radiology: ordered.  Risk Prescription drug management.   This patient presents to the ED for concern of headache, this involves an extensive number of treatment options, and is a complaint that carries with it a high risk of complications and morbidity.  The differential diagnosis includes migraine, trigeminal neuralgia, primary vision problem, intracranial abn   Co morbidities that complicate the patient evaluation   fibroids, migraines, IBS, obesity, htn, dm, and sleep apnea   Additional history obtained:  Additional history obtained from epic chart review  Lab Tests:  I Ordered, and personally interpreted labs.  The pertinent results include:  preg neg, cbc nl, bmp nl   Imaging Studies ordered:  I ordered imaging studies including ct head  I independently visualized and interpreted imaging which showed  No CT evidence of acute intracranial abnormality.    Similar appearance of paranasal sinus disease.      I agree with the radiologist interpretation   Cardiac Monitoring:  The patient was maintained on a  cardiac monitor.  I personally viewed and interpreted the cardiac monitored which showed an underlying rhythm of: nsr   Medicines ordered and prescription drug management:  I ordered medication including ivfs/toradol/reglan/benadryl  for sx  Reevaluation of  the patient after these medicines showed that the patient improved I have reviewed the patients home medicines and have made adjustments as needed   Test Considered:  ct   Problem List / ED Course:  Headache:  I suspect migraine.  Pt is feeling much better after tx.  She is stable for d/c.  She is to f/u with pcp and with neurology.   Reevaluation:  After the interventions noted above, I reevaluated the patient and found that they have :improved   Social Determinants of Health:  Lives at home   Dispostion:  After consideration of the diagnostic results and the patients response to treatment, I feel that the patent would benefit from discharge with outpatient f/u.          Final Clinical Impression(s) / ED Diagnoses Final diagnoses:  Acute nonintractable headache, unspecified headache type    Rx / DC Orders ED Discharge Orders          Ordered    Ambulatory referral to Neurology       Comments: An appointment is requested in approximately: 1 week   07/13/23 1308    rizatriptan (MAXALT-MLT) 10 MG disintegrating tablet  As needed        07/13/23 1310    ondansetron (ZOFRAN-ODT) 4 MG disintegrating tablet  Every 8 hours PRN        07/13/23 1310              Jacalyn Lefevre, MD 07/13/23 1310

## 2023-07-13 NOTE — ED Triage Notes (Signed)
 C/o stabbing headache 3 times daily for 1 week. States episode lasts 10 seconds and causes some blurred vision, disorientation and sometimes dizziness during episode. Hx of migraines

## 2023-11-04 ENCOUNTER — Ambulatory Visit: Payer: Self-pay | Admitting: Neurology

## 2024-04-30 ENCOUNTER — Encounter: Payer: Self-pay | Admitting: Emergency Medicine

## 2024-04-30 ENCOUNTER — Ambulatory Visit: Admission: EM | Admit: 2024-04-30 | Discharge: 2024-04-30 | Disposition: A | Discharge status: Home / Self Care

## 2024-04-30 DIAGNOSIS — J069 Acute upper respiratory infection, unspecified: Secondary | ICD-10-CM

## 2024-04-30 LAB — POC COVID19/FLU A&B COMBO
Covid Antigen, POC: NEGATIVE
Influenza A Antigen, POC: NEGATIVE
Influenza B Antigen, POC: NEGATIVE

## 2024-04-30 MED ORDER — BENZONATATE 100 MG PO CAPS: 100.0000 mg | ORAL_CAPSULE | Freq: Three times a day (TID) | ORAL | 0 refills | Status: AC
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
## 2024-04-30 NOTE — ED Triage Notes (Signed)
 Pt reports fevers at night, generalized body aches, headache and a cough since Friday. Has tried Zicam x 2 days, advil /tylenol  and tea.

## 2024-04-30 NOTE — Discharge Instructions (Signed)
